# Patient Record
Sex: Female | Born: 1945 | Race: White | Hispanic: No | Marital: Married | Smoking: Never smoker
Health system: Southern US, Community
[De-identification: ages and names within clinical notes are randomized; demographics above are authoritative.]

## PROBLEM LIST (undated history)

## (undated) DIAGNOSIS — R7303 Prediabetes: Secondary | ICD-10-CM

## (undated) DIAGNOSIS — Z860100 Personal history of colon polyps, unspecified: Secondary | ICD-10-CM

## (undated) DIAGNOSIS — E785 Hyperlipidemia, unspecified: Secondary | ICD-10-CM

## (undated) DIAGNOSIS — M545 Low back pain, unspecified: Secondary | ICD-10-CM

## (undated) DIAGNOSIS — F419 Anxiety disorder, unspecified: Secondary | ICD-10-CM

## (undated) DIAGNOSIS — G4730 Sleep apnea, unspecified: Secondary | ICD-10-CM

## (undated) DIAGNOSIS — N904 Leukoplakia of vulva: Secondary | ICD-10-CM

## (undated) DIAGNOSIS — C801 Malignant (primary) neoplasm, unspecified: Secondary | ICD-10-CM

## (undated) DIAGNOSIS — K5790 Diverticulosis of intestine, part unspecified, without perforation or abscess without bleeding: Secondary | ICD-10-CM

## (undated) DIAGNOSIS — M7071 Other bursitis of hip, right hip: Secondary | ICD-10-CM

## (undated) DIAGNOSIS — E039 Hypothyroidism, unspecified: Secondary | ICD-10-CM

## (undated) DIAGNOSIS — Z972 Presence of dental prosthetic device (complete) (partial): Secondary | ICD-10-CM

## (undated) DIAGNOSIS — M1990 Unspecified osteoarthritis, unspecified site: Secondary | ICD-10-CM

## (undated) DIAGNOSIS — G454 Transient global amnesia: Secondary | ICD-10-CM

## (undated) DIAGNOSIS — K2270 Barrett's esophagus without dysplasia: Secondary | ICD-10-CM

## (undated) DIAGNOSIS — K219 Gastro-esophageal reflux disease without esophagitis: Secondary | ICD-10-CM

## (undated) DIAGNOSIS — K648 Other hemorrhoids: Secondary | ICD-10-CM

## (undated) DIAGNOSIS — N189 Chronic kidney disease, unspecified: Secondary | ICD-10-CM

## (undated) HISTORY — PX: BREAST ENHANCEMENT SURGERY: SHX7

## (undated) HISTORY — DX: Gastro-esophageal reflux disease without esophagitis: K21.9

## (undated) HISTORY — DX: Personal history of colonic polyps: Z86.010

## (undated) HISTORY — DX: Hyperlipidemia, unspecified: E78.5

## (undated) HISTORY — DX: Transient global amnesia: G45.4

## (undated) HISTORY — PX: TUBAL LIGATION: SHX77

## (undated) HISTORY — DX: Hypothyroidism, unspecified: E03.9

## (undated) HISTORY — DX: Diverticulosis of intestine, part unspecified, without perforation or abscess without bleeding: K57.90

## (undated) HISTORY — DX: Anxiety disorder, unspecified: F41.9

## (undated) HISTORY — DX: Sleep apnea, unspecified: G47.30

## (undated) HISTORY — DX: Other hemorrhoids: K64.8

## (undated) HISTORY — DX: Leukoplakia of vulva: N90.4

## (undated) HISTORY — DX: Personal history of colon polyps, unspecified: Z86.0100

## (undated) HISTORY — PX: OTHER SURGICAL HISTORY: SHX169

## (undated) HISTORY — PX: DILATION AND CURETTAGE OF UTERUS: SHX78

## (undated) HISTORY — DX: Barrett's esophagus without dysplasia: K22.70

## (undated) HISTORY — PX: HYSTEROSCOPY: SHX211

## (undated) HISTORY — PX: LASIK: SHX215

---

## 1998-12-09 ENCOUNTER — Other Ambulatory Visit: Admission: RE | Admit: 1998-12-09 | Discharge: 1998-12-09 | Payer: Self-pay | Admitting: Internal Medicine

## 2000-01-19 ENCOUNTER — Other Ambulatory Visit: Admission: RE | Admit: 2000-01-19 | Discharge: 2000-01-19 | Payer: Self-pay | Admitting: *Deleted

## 2001-04-23 DIAGNOSIS — K2270 Barrett's esophagus without dysplasia: Secondary | ICD-10-CM

## 2001-04-23 HISTORY — DX: Barrett's esophagus without dysplasia: K22.70

## 2001-05-22 ENCOUNTER — Other Ambulatory Visit: Admission: RE | Admit: 2001-05-22 | Discharge: 2001-05-22 | Payer: Self-pay | Admitting: *Deleted

## 2001-06-10 ENCOUNTER — Ambulatory Visit (HOSPITAL_COMMUNITY): Admission: RE | Admit: 2001-06-10 | Discharge: 2001-06-10 | Payer: Self-pay | Admitting: *Deleted

## 2002-01-05 ENCOUNTER — Encounter (INDEPENDENT_AMBULATORY_CARE_PROVIDER_SITE_OTHER): Payer: Self-pay

## 2002-01-05 ENCOUNTER — Ambulatory Visit (HOSPITAL_COMMUNITY): Admission: RE | Admit: 2002-01-05 | Discharge: 2002-01-05 | Payer: Self-pay | Admitting: *Deleted

## 2002-02-16 ENCOUNTER — Other Ambulatory Visit: Admission: RE | Admit: 2002-02-16 | Discharge: 2002-02-16 | Payer: Self-pay | Admitting: Gynecology

## 2003-04-09 ENCOUNTER — Encounter (INDEPENDENT_AMBULATORY_CARE_PROVIDER_SITE_OTHER): Payer: Self-pay | Admitting: Specialist

## 2003-04-09 ENCOUNTER — Ambulatory Visit (HOSPITAL_COMMUNITY): Admission: RE | Admit: 2003-04-09 | Discharge: 2003-04-09 | Payer: Self-pay | Admitting: *Deleted

## 2004-07-27 ENCOUNTER — Other Ambulatory Visit: Admission: RE | Admit: 2004-07-27 | Discharge: 2004-07-27 | Payer: Self-pay | Admitting: Gynecology

## 2004-10-04 ENCOUNTER — Ambulatory Visit (HOSPITAL_COMMUNITY): Admission: RE | Admit: 2004-10-04 | Discharge: 2004-10-04 | Payer: Self-pay | Admitting: Gynecology

## 2004-10-04 ENCOUNTER — Encounter (INDEPENDENT_AMBULATORY_CARE_PROVIDER_SITE_OTHER): Payer: Self-pay | Admitting: Specialist

## 2005-03-29 ENCOUNTER — Encounter (INDEPENDENT_AMBULATORY_CARE_PROVIDER_SITE_OTHER): Payer: Self-pay | Admitting: Specialist

## 2005-03-29 ENCOUNTER — Ambulatory Visit (HOSPITAL_COMMUNITY): Admission: RE | Admit: 2005-03-29 | Discharge: 2005-03-29 | Payer: Self-pay | Admitting: *Deleted

## 2006-05-16 LAB — CONVERTED CEMR LAB: Pap Smear: NORMAL

## 2007-06-02 ENCOUNTER — Ambulatory Visit: Payer: Self-pay | Admitting: Internal Medicine

## 2007-06-02 DIAGNOSIS — E785 Hyperlipidemia, unspecified: Secondary | ICD-10-CM | POA: Insufficient documentation

## 2007-06-02 LAB — CONVERTED CEMR LAB
Bilirubin Urine: NEGATIVE
Blood in Urine, dipstick: NEGATIVE
Glucose, Urine, Semiquant: NEGATIVE
Ketones, urine, test strip: NEGATIVE
Nitrite: NEGATIVE
Specific Gravity, Urine: 1.025
Urobilinogen, UA: 0.2
WBC Urine, dipstick: NEGATIVE
pH: 6.5

## 2007-06-03 LAB — CONVERTED CEMR LAB
ALT: 32 units/L (ref 0–35)
AST: 21 units/L (ref 0–37)
Albumin: 4.2 g/dL (ref 3.5–5.2)
Alkaline Phosphatase: 76 units/L (ref 39–117)
BUN: 14 mg/dL (ref 6–23)
Basophils Absolute: 0 10*3/uL (ref 0.0–0.1)
Basophils Relative: 0.3 % (ref 0.0–1.0)
Bilirubin, Direct: 0.2 mg/dL (ref 0.0–0.3)
CO2: 30 meq/L (ref 19–32)
Calcium: 9.8 mg/dL (ref 8.4–10.5)
Chloride: 105 meq/L (ref 96–112)
Cholesterol: 177 mg/dL (ref 0–200)
Creatinine, Ser: 1.1 mg/dL (ref 0.4–1.2)
Eosinophils Absolute: 0.2 10*3/uL (ref 0.0–0.6)
Eosinophils Relative: 3.2 % (ref 0.0–5.0)
GFR calc Af Amer: 65 mL/min
GFR calc non Af Amer: 54 mL/min
Glucose, Bld: 103 mg/dL — ABNORMAL HIGH (ref 70–99)
HCT: 40.1 % (ref 36.0–46.0)
HDL: 40.3 mg/dL (ref >39.0)
Hemoglobin: 13.6 g/dL (ref 12.0–15.0)
LDL Cholesterol: 103 mg/dL — ABNORMAL HIGH (ref 0–99)
Lymphocytes Relative: 31.9 % (ref 12.0–46.0)
MCHC: 33.8 g/dL (ref 30.0–36.0)
MCV: 90.1 fL (ref 78.0–100.0)
Monocytes Absolute: 0.5 10*3/uL (ref 0.2–0.7)
Monocytes Relative: 7.6 % (ref 3.0–11.0)
Neutro Abs: 4.1 10*3/uL (ref 1.4–7.7)
Neutrophils Relative %: 57 % (ref 43.0–77.0)
Platelets: 270 10*3/uL (ref 150–400)
Potassium: 4.1 meq/L (ref 3.5–5.1)
RBC: 4.45 M/uL (ref 3.87–5.11)
RDW: 12.2 % (ref 11.5–14.6)
Sodium: 143 meq/L (ref 135–145)
TSH: 0.91 microintl units/mL (ref 0.35–5.50)
Total Bilirubin: 0.6 mg/dL (ref 0.3–1.2)
Total CHOL/HDL Ratio: 4.4
Total Protein: 6.5 g/dL (ref 6.0–8.3)
Triglycerides: 170 mg/dL — ABNORMAL HIGH (ref 0–149)
VLDL: 34 mg/dL (ref 0–40)
WBC: 7.1 10*3/uL (ref 4.5–10.5)

## 2007-06-05 ENCOUNTER — Telehealth: Payer: Self-pay | Admitting: Internal Medicine

## 2007-07-15 ENCOUNTER — Encounter: Payer: Self-pay | Admitting: Internal Medicine

## 2007-08-25 ENCOUNTER — Encounter: Payer: Self-pay | Admitting: Internal Medicine

## 2007-08-27 ENCOUNTER — Telehealth: Payer: Self-pay | Admitting: Internal Medicine

## 2007-11-24 ENCOUNTER — Ambulatory Visit: Payer: Self-pay | Admitting: Internal Medicine

## 2007-11-24 LAB — CONVERTED CEMR LAB
ALT: 32 units/L (ref 0–35)
AST: 21 units/L (ref 0–37)
Albumin: 3.8 g/dL (ref 3.5–5.2)
Alkaline Phosphatase: 65 units/L (ref 39–117)
Bilirubin, Direct: 0.1 mg/dL (ref 0.0–0.3)
Cholesterol: 186 mg/dL (ref 0–200)
HDL: 41.8 mg/dL (ref >39.0)
LDL Cholesterol: 106 mg/dL — ABNORMAL HIGH (ref 0–99)
TSH: 3.53 microintl units/mL (ref 0.35–5.50)
Total Bilirubin: 0.7 mg/dL (ref 0.3–1.2)
Total CHOL/HDL Ratio: 4.4
Total Protein: 6.3 g/dL (ref 6.0–8.3)
Triglycerides: 190 mg/dL — ABNORMAL HIGH (ref 0–149)
VLDL: 38 mg/dL (ref 0–40)

## 2007-12-22 ENCOUNTER — Ambulatory Visit: Payer: Self-pay | Admitting: Internal Medicine

## 2008-01-01 ENCOUNTER — Encounter: Payer: Self-pay | Admitting: Internal Medicine

## 2008-02-13 ENCOUNTER — Ambulatory Visit: Payer: Self-pay | Admitting: Internal Medicine

## 2008-02-13 LAB — CONVERTED CEMR LAB
Free T4: 0.6 ng/dL (ref 0.6–1.6)
T3, Free: 2.8 pg/mL (ref 2.3–4.2)
TSH: 4.88 microintl units/mL (ref 0.35–5.50)

## 2008-02-20 ENCOUNTER — Telehealth: Payer: Self-pay | Admitting: Internal Medicine

## 2008-04-15 ENCOUNTER — Encounter: Payer: Self-pay | Admitting: Internal Medicine

## 2008-04-19 ENCOUNTER — Telehealth: Payer: Self-pay | Admitting: Internal Medicine

## 2008-05-27 ENCOUNTER — Ambulatory Visit: Payer: Self-pay | Admitting: Internal Medicine

## 2008-05-27 LAB — CONVERTED CEMR LAB
ALT: 33 units/L (ref 0–35)
AST: 21 units/L (ref 0–37)
Albumin: 4.1 g/dL (ref 3.5–5.2)
Alkaline Phosphatase: 62 units/L (ref 39–117)
BUN: 23 mg/dL (ref 6–23)
Basophils Absolute: 0 10*3/uL (ref 0.0–0.1)
Basophils Relative: 0.7 % (ref 0.0–3.0)
Bilirubin, Direct: 0.1 mg/dL (ref 0.0–0.3)
Blood in Urine, dipstick: NEGATIVE
CO2: 0 meq/L — CL (ref 19–32)
Calcium: 9.4 mg/dL (ref 8.4–10.5)
Chloride: 111 meq/L (ref 96–112)
Cholesterol: 167 mg/dL (ref 0–200)
Creatinine, Ser: 1.1 mg/dL (ref 0.4–1.2)
Eosinophils Absolute: 0.2 10*3/uL (ref 0.0–0.7)
Eosinophils Relative: 3.3 % (ref 0.0–5.0)
GFR calc Af Amer: 65 mL/min
GFR calc non Af Amer: 54 mL/min
Glucose, Bld: 101 mg/dL — ABNORMAL HIGH (ref 70–99)
Glucose, Urine, Semiquant: NEGATIVE
HCT: 40.1 % (ref 36.0–46.0)
HDL: 41 mg/dL (ref >39.0)
Hemoglobin: 13.5 g/dL (ref 12.0–15.0)
Ketones, urine, test strip: NEGATIVE
LDL Cholesterol: 103 mg/dL — ABNORMAL HIGH (ref 0–99)
Lymphocytes Relative: 40.9 % (ref 12.0–46.0)
MCHC: 33.7 g/dL (ref 30.0–36.0)
MCV: 93.2 fL (ref 78.0–100.0)
Monocytes Absolute: 0.5 10*3/uL (ref 0.1–1.0)
Monocytes Relative: 8 % (ref 3.0–12.0)
Neutro Abs: 2.7 10*3/uL (ref 1.4–7.7)
Neutrophils Relative %: 47.1 % (ref 43.0–77.0)
Nitrite: NEGATIVE
Platelets: 223 10*3/uL (ref 150–400)
Potassium: 4.2 meq/L (ref 3.5–5.1)
RBC: 4.31 M/uL (ref 3.87–5.11)
RDW: 12.7 % (ref 11.5–14.6)
Sodium: 143 meq/L (ref 135–145)
Specific Gravity, Urine: 1.02
TSH: 5.27 microintl units/mL (ref 0.35–5.50)
Total Bilirubin: 0.7 mg/dL (ref 0.3–1.2)
Total CHOL/HDL Ratio: 4.1
Total Protein: 6.8 g/dL (ref 6.0–8.3)
Triglycerides: 115 mg/dL (ref 0–149)
Urobilinogen, UA: 0.2
VLDL: 23 mg/dL (ref 0–40)
WBC Urine, dipstick: NEGATIVE
WBC: 5.8 10*3/uL (ref 4.5–10.5)
pH: 5.5

## 2008-06-04 ENCOUNTER — Ambulatory Visit: Payer: Self-pay | Admitting: Internal Medicine

## 2008-07-15 ENCOUNTER — Telehealth: Payer: Self-pay | Admitting: Internal Medicine

## 2008-08-26 ENCOUNTER — Encounter: Payer: Self-pay | Admitting: Internal Medicine

## 2008-09-27 ENCOUNTER — Telehealth: Payer: Self-pay | Admitting: Internal Medicine

## 2008-11-02 ENCOUNTER — Telehealth: Payer: Self-pay | Admitting: Internal Medicine

## 2009-03-29 ENCOUNTER — Encounter (INDEPENDENT_AMBULATORY_CARE_PROVIDER_SITE_OTHER): Payer: Self-pay | Admitting: *Deleted

## 2009-04-23 LAB — CONVERTED CEMR LAB: Pap Smear: NORMAL

## 2009-07-14 ENCOUNTER — Telehealth: Payer: Self-pay | Admitting: Internal Medicine

## 2009-07-15 ENCOUNTER — Telehealth: Payer: Self-pay | Admitting: *Deleted

## 2009-07-20 ENCOUNTER — Ambulatory Visit: Payer: Self-pay | Admitting: Internal Medicine

## 2009-07-20 LAB — CONVERTED CEMR LAB
ALT: 27 units/L (ref 0–35)
AST: 18 units/L (ref 0–37)
Albumin: 3.9 g/dL (ref 3.5–5.2)
Alkaline Phosphatase: 68 units/L (ref 39–117)
BUN: 22 mg/dL (ref 6–23)
Basophils Absolute: 0.1 10*3/uL (ref 0.0–0.1)
Basophils Relative: 0.9 % (ref 0.0–3.0)
Bilirubin Urine: NEGATIVE
Bilirubin, Direct: 0.1 mg/dL (ref 0.0–0.3)
CO2: 30 meq/L (ref 19–32)
Calcium: 9.4 mg/dL (ref 8.4–10.5)
Chloride: 109 meq/L (ref 96–112)
Cholesterol: 151 mg/dL (ref 0–200)
Creatinine, Ser: 1 mg/dL (ref 0.4–1.2)
Eosinophils Absolute: 0.2 10*3/uL (ref 0.0–0.7)
Eosinophils Relative: 4 % (ref 0.0–5.0)
GFR calc non Af Amer: 59.77 mL/min (ref >60)
Glucose, Bld: 101 mg/dL — ABNORMAL HIGH (ref 70–99)
HCT: 38.9 % (ref 36.0–46.0)
HDL: 49.2 mg/dL (ref >39.00)
Hemoglobin, Urine: NEGATIVE
Hemoglobin: 12.9 g/dL (ref 12.0–15.0)
Ketones, ur: NEGATIVE mg/dL
LDL Cholesterol: 79 mg/dL (ref 0–99)
Leukocytes, UA: NEGATIVE
Lymphocytes Relative: 41.6 % (ref 12.0–46.0)
Lymphs Abs: 2.4 10*3/uL (ref 0.7–4.0)
MCHC: 33.1 g/dL (ref 30.0–36.0)
MCV: 94.6 fL (ref 78.0–100.0)
Monocytes Absolute: 0.5 10*3/uL (ref 0.1–1.0)
Monocytes Relative: 8.6 % (ref 3.0–12.0)
Neutro Abs: 2.6 10*3/uL (ref 1.4–7.7)
Neutrophils Relative %: 44.9 % (ref 43.0–77.0)
Nitrite: NEGATIVE
Platelets: 266 10*3/uL (ref 150.0–400.0)
Potassium: 4.2 meq/L (ref 3.5–5.1)
RBC: 4.11 M/uL (ref 3.87–5.11)
RDW: 12.2 % (ref 11.5–14.6)
Sodium: 145 meq/L (ref 135–145)
Specific Gravity, Urine: >=1.03 (ref 1.000–1.030)
TSH: 5.9 microintl units/mL — ABNORMAL HIGH (ref 0.35–5.50)
Total Bilirubin: 0.5 mg/dL (ref 0.3–1.2)
Total CHOL/HDL Ratio: 3
Total Protein, Urine: NEGATIVE mg/dL
Total Protein: 6.7 g/dL (ref 6.0–8.3)
Triglycerides: 112 mg/dL (ref 0.0–149.0)
Urine Glucose: NEGATIVE mg/dL
Urobilinogen, UA: 0.2 (ref 0.0–1.0)
VLDL: 22.4 mg/dL (ref 0.0–40.0)
WBC: 5.8 10*3/uL (ref 4.5–10.5)
pH: 6 (ref 5.0–8.0)

## 2009-08-02 ENCOUNTER — Telehealth: Payer: Self-pay | Admitting: Internal Medicine

## 2009-08-02 ENCOUNTER — Ambulatory Visit: Payer: Self-pay | Admitting: Internal Medicine

## 2009-08-02 DIAGNOSIS — E039 Hypothyroidism, unspecified: Secondary | ICD-10-CM

## 2009-08-05 ENCOUNTER — Encounter (INDEPENDENT_AMBULATORY_CARE_PROVIDER_SITE_OTHER): Payer: Self-pay | Admitting: *Deleted

## 2009-08-05 ENCOUNTER — Encounter: Payer: Self-pay | Admitting: Internal Medicine

## 2009-08-08 ENCOUNTER — Telehealth: Payer: Self-pay | Admitting: Internal Medicine

## 2009-08-10 ENCOUNTER — Telehealth: Payer: Self-pay | Admitting: Internal Medicine

## 2009-08-17 ENCOUNTER — Encounter (INDEPENDENT_AMBULATORY_CARE_PROVIDER_SITE_OTHER): Payer: Self-pay | Admitting: *Deleted

## 2009-08-18 ENCOUNTER — Encounter: Payer: Self-pay | Admitting: Internal Medicine

## 2009-08-19 ENCOUNTER — Ambulatory Visit: Payer: Self-pay | Admitting: Internal Medicine

## 2009-08-29 ENCOUNTER — Encounter: Payer: Self-pay | Admitting: Internal Medicine

## 2009-09-02 ENCOUNTER — Ambulatory Visit: Payer: Self-pay | Admitting: Internal Medicine

## 2009-09-27 ENCOUNTER — Telehealth: Payer: Self-pay | Admitting: Internal Medicine

## 2009-11-17 ENCOUNTER — Telehealth: Payer: Self-pay | Admitting: Internal Medicine

## 2010-01-31 ENCOUNTER — Ambulatory Visit: Payer: Self-pay | Admitting: Internal Medicine

## 2010-02-01 LAB — CONVERTED CEMR LAB
ALT: 23 units/L (ref 0–35)
AST: 18 units/L (ref 0–37)
Basophils Absolute: 0 10*3/uL (ref 0.0–0.1)
Bilirubin, Direct: 0.1 mg/dL (ref 0.0–0.3)
Cholesterol: 173 mg/dL (ref 0–200)
Eosinophils Absolute: 0.2 10*3/uL (ref 0.0–0.7)
HDL: 45.5 mg/dL (ref >39.00)
MCHC: 34.3 g/dL (ref 30.0–36.0)
MCV: 93.3 fL (ref 78.0–100.0)
Monocytes Absolute: 0.5 10*3/uL (ref 0.1–1.0)
Neutrophils Relative %: 53.9 % (ref 43.0–77.0)
Platelets: 255 10*3/uL (ref 150.0–400.0)
TSH: 3.55 microintl units/mL (ref 0.35–5.50)
Total Bilirubin: 0.6 mg/dL (ref 0.3–1.2)
Total Protein: 6.5 g/dL (ref 6.0–8.3)

## 2010-03-20 ENCOUNTER — Ambulatory Visit (HOSPITAL_COMMUNITY): Admission: RE | Admit: 2010-03-20 | Discharge: 2010-03-20 | Payer: Self-pay | Admitting: Obstetrics and Gynecology

## 2010-03-24 ENCOUNTER — Telehealth: Payer: Self-pay | Admitting: Internal Medicine

## 2010-05-22 ENCOUNTER — Telehealth: Payer: Self-pay | Admitting: Internal Medicine

## 2010-05-23 NOTE — Medication Information (Signed)
Summary: Coverage Approval for Nexium  Coverage Approval for Nexium   Imported By: Maryln Gottron 08/23/2009 14:21:32  _____________________________________________________________________  External Attachment:    Type:   Image     Comment:   External Document

## 2010-05-23 NOTE — Progress Notes (Signed)
Summary: med change request  Phone Note Other Incoming Call back at [phone]   Caller: [employer] via fax Summary of Call: Nexium requires prior authorization.  Can get prilosec or generic omeprazole or generic pantoprazole without prior authorization..   Please advise. Initial call taken by: Gladis Riffle, RN,  August 08, 2009 3:30 PM  Follow-up for Phone Call        change to omeprazole 20 mg by mouth once daily #90/3 Follow-up by: Birdie Sons MD,  August 08, 2009 3:40 PM  Additional Follow-up for Phone Call Additional follow up Details #1::        med changed on list of last ov.  see Rx.  Patient notified.  Additional Follow-up by: Gladis Riffle, RN,  August 08, 2009 4:23 PM

## 2010-05-23 NOTE — Medication Information (Signed)
Summary: Coverage Approval for Ambien  Coverage Approval for Ambien   Imported By: Maryln Gottron 08/08/2009 15:29:31  _____________________________________________________________________  External Attachment:    Type:   Image     Comment:   External Document

## 2010-05-23 NOTE — Progress Notes (Signed)
Summary: call  Phone Note Call from Patient   Caller: Patient Call For: Birdie Sons MD Summary of Call: [phone] Returned call and LMTCB. Called again about changing generic med back to Nexium.  [facility].  [phone].  Rudy Jew, RN  September 27, 2009 12:16 PM  Initial call taken by: Lynann Beaver CMA,  September 27, 2009 10:17 AM    New/Updated Medications: NEXIUM 20 MG CPDR (ESOMEPRAZOLE MAGNESIUM) one by mouth daily Prescriptions: NEXIUM 20 MG CPDR (ESOMEPRAZOLE MAGNESIUM) one by mouth daily  #90 x 3   Entered by:   Lynann Beaver CMA   Authorized by:   Birdie Sons MD   Signed by:   Lynann Beaver CMA on 09/27/2009   Method used:   Electronically to        [facility]* (mail-order)             ,          Ph: [phone]       Fax: [phone]   RxID:   [id number]  Pt notified.

## 2010-05-23 NOTE — Progress Notes (Signed)
Summary: [facility]  Phone Note Call from Patient Call back at Work Phone [phone]   Caller: Patient-----voicemail Summary of Call: Was seen this morning. She uses [facility] for her Lipitor and Nexium. Initial call taken by: Warnell Forester,  August 02, 2009 11:04 AM  Follow-up for Phone Call        see Rx on ov 08/02/09 Follow-up by: Gladis Riffle, RN,  August 02, 2009 3:18 PM

## 2010-05-23 NOTE — Miscellaneous (Signed)
Summary: [facility]  Clinical Lists Changes  Medications: Added new medication of MIRALAX   POWD (POLYETHYLENE GLYCOL 3350) As per prep  instructions. - Signed Added new medication of REGLAN 10 MG  TABS (METOCLOPRAMIDE HCL) As per prep instructions. - Signed Added new medication of DULCOLAX 5 MG  TBEC (BISACODYL) Day before procedure take 2 at 3pm and 2 at 8pm. - Signed Rx of MIRALAX   POWD (POLYETHYLENE GLYCOL 3350) As per prep  instructions.;  #255gm x 0;  Signed;  Entered by: Ezra Sites RN;  Authorized by: Hart Carwin MD;  Method used: Electronically to [facility]*, [address], [place], [place]  [address], Ph: [phone], Fax: [phone] Rx of REGLAN 10 MG  TABS (METOCLOPRAMIDE HCL) As per prep instructions.;  #2 x 0;  Signed;  Entered by: Ezra Sites RN;  Authorized by: Hart Carwin MD;  Method used: Electronically to [facility]*, [address], [place], [place]  [address], Ph: [phone], Fax: [phone] Rx of DULCOLAX 5 MG  TBEC (BISACODYL) Day before procedure take 2 at 3pm and 2 at 8pm.;  #4 x 0;  Signed;  Entered by: Ezra Sites RN;  Authorized by: Hart Carwin MD;  Method used: Electronically to [facility]*, [address], [place], [place]  [address], Ph: [phone], Fax: [phone] Observations: Added new observation of NKA: T (08/19/2009 8:20)    Prescriptions: DULCOLAX 5 MG  TBEC (BISACODYL) Day before procedure take 2 at 3pm and 2 at 8pm.  #4 x 0   Entered by:   Ezra Sites RN   Authorized by:   Hart Carwin MD   Signed by:   Ezra Sites RN on 08/19/2009   Method used:   Electronically to        [facility]* (retail)       [address]       [place], [place]  [address]       Ph: [phone]       Fax: [phone]   RxID:   [id number] REGLAN 10 MG  TABS (METOCLOPRAMIDE HCL) As per prep instructions.  #2 x 0   Entered by:   Ezra Sites RN   Authorized by:   Hart Carwin MD   Signed by:    Ezra Sites RN on 08/19/2009   Method used:   Electronically to        [facility]* (retail)       [address]       [place], [place]  [address]       Ph: [phone]       Fax: [phone]   RxID:   [id number] MIRALAX   POWD (POLYETHYLENE GLYCOL 3350) As per prep  instructions.  #255gm x 0   Entered by:   Ezra Sites RN   Authorized by:   Hart Carwin MD   Signed by:   Ezra Sites RN on 08/19/2009   Method used:   Electronically to        [facility]* (retail)       [address]       [place], [place]  [address]       Ph: [phone]       Fax: [phone]   RxID:   [id number]

## 2010-05-23 NOTE — Letter (Signed)
Summary: Previsit letter  [facility]  [address] [place], [place] [address]   Phone: [phone]  Fax: [phone]       08/05/2009 MRN: [id number]  [facility] [address] [place], [place]  [address]  Dear Megan Booth,  Welcome to the Gastroenterology Division at [facility].    You are scheduled to see a nurse for your pre-procedure visit on 08-19-09 at 3:30p.m. on the 3rd floor at [facility], [address]  We ask that you try to arrive at our office 15 minutes prior to your appointment time to allow for check-in.  Your nurse visit will consist of discussing your medical and surgical history, your immediate family medical history, and your medications.    Please bring a complete list of all your medications or, if you prefer, bring the medication bottles and we will list them.  We will need to be aware of both prescribed and over the counter drugs.  We will need to know exact dosage information as well.  If you are on blood thinners (Coumadin, Plavix, Aggrenox, Ticlid, etc.) please call our office today/prior to your appointment, as we need to consult with your physician about holding your medication.   Please be prepared to read and sign documents such as consent forms, a financial agreement, and acknowledgement forms.  If necessary, and with your consent, a friend or relative is welcome to sit-in on the nurse visit with you.  Please bring your insurance card so that we may make a copy of it.  If your insurance requires a referral to see a specialist, please bring your referral form from your primary care physician.  No co-pay is required for this nurse visit.     If you cannot keep your appointment, please call [phone] to cancel or reschedule prior to your appointment date.  This allows [place] the opportunity to schedule an appointment for another patient in need of care.    Thank you for choosing [facility] for your medical  needs.  We appreciate the opportunity to care for you.  Please visit [place] at our website  to learn more about our practice.                     Sincerely.                                                                                                                   The Gastroenterology Division

## 2010-05-23 NOTE — Progress Notes (Signed)
Summary: Pt req partial refill of Liptor to [facility] Note Call from Patient Call back at [employer] [phone] Call back at Work Phone [phone]   Caller: Patient Summary of Call: Pt almost out of Lipitor. Needs partial refill #14 or less, to last until her ov on 07/29/09. Please call in to local [facility].  Initial call taken by: Lucy Antigua,  July 15, 2009 10:24 AM    Prescriptions: LIPITOR 40 MG  TABS (ATORVASTATIN CALCIUM) once daily  #15 x 0   Entered by:   Kern Reap CMA (AAMA)   Authorized by:   Birdie Sons MD   Signed by:   Kern Reap CMA (AAMA) on 07/15/2009   Method used:   Electronically to        [facility]* (retail)       [address]       [place], [place]  [address]       Ph: [phone]       Fax: [phone]   RxID:   [id number]

## 2010-05-23 NOTE — Procedures (Signed)
Summary: Colonoscopy  Patient: Megan Booth Note: All result statuses are Final unless otherwise noted.  Tests: (1) Colonoscopy (COL)   COL Colonoscopy           DONE     [facility]     [address]     [place], [place]  [address]           COLONOSCOPY PROCEDURE REPORT           PATIENT:  [name], [name]  MR#:  [id number]     BIRTHDATE:  [birth year], 63 yrs. old  GENDER:  female     ENDOSCOPIST:  Hedwig Morton. Juanda Chance, MD     REF. BY:  Birdie Sons, M.D.     PROCEDURE DATE:  09/02/2009     PROCEDURE:  Colonoscopy 19[address]     ASA CLASS:  Class I     INDICATIONS:  Routine Risk Screening     MEDICATIONS:   Versed 10 mg, Fentanyl 75 mcg           DESCRIPTION OF PROCEDURE:   After the risks benefits and     alternatives of the procedure were thoroughly explained, informed     consent was obtained.  Digital rectal exam was performed and     revealed no rectal masses.   The LB [id number] endoscope     was introduced through the anus and advanced to the cecum, which     was identified by both the appendix and ileocecal valve, without     limitations.  The quality of the prep was good, using MiraLax.     The instrument was then slowly withdrawn as the colon was fully     examined.     <<PROCEDUREIMAGES>>           FINDINGS:  Moderate diverticulosis was found in the sigmoid colon     (see image4, image3, image5, and image6).  This was otherwise a     normal examination of the colon (see image7, image2, and image1).     Retroflexed views in the rectum revealed no abnormalities.    The     scope was then withdrawn from the patient and the procedure     completed.           COMPLICATIONS:  None     ENDOSCOPIC IMPRESSION:     1) Moderate diverticulosis in the sigmoid colon     2) Otherwise normal examination     RECOMMENDATIONS:     1) high fiber diet     REPEAT EXAM:  In 10 year(s) for.           ______________________________     Hedwig Morton. Juanda Chance, MD           CC:        n.     eSIGNED:   Hedwig Morton. Richel Millspaugh at 09/02/2009 10:59 AM           Abby Potash, [phone]  Note: An exclamation mark (!) indicates a result that was not dispersed into the flowsheet. Document Creation Date: 09/02/2009 10:59 AM _______________________________________________________________________  (1) Order result status: Final Collection or observation date-time: 09/02/2009 10:53 Requested date-time:  Receipt date-time:  Reported date-time:  Referring Physician:   Ordering Physician: [name] [phone]) Specimen Source:  Source: Launa Grill Order Number: [id number] Lab site:   Appended Document: Colonoscopy    Clinical Lists Changes  Observations: Added new observation of COLONNXTDUE: 08/2019 (09/02/2009 11:31)

## 2010-05-23 NOTE — Progress Notes (Signed)
Summary: Ambien and Nexium refill request  Phone Note Call from Patient   Caller: Patient Call For: Birdie Sons MD Summary of Call: VM, Pt requesting refill Nexium and Ambien.  BC/BS may require a new PA [facility] Initial call taken by: Sid Falcon LPN,  March 24, 2010 12:45 PM  Follow-up for Phone Call        Remus Loffler already called in, sent in nexium electronically Follow-up by: Alfred Levins, CMA,  March 24, 2010 3:13 PM    Prescriptions: NEXIUM 20 MG CPDR (ESOMEPRAZOLE MAGNESIUM) one by mouth daily  #30 x 5   Entered by:   Alfred Levins, CMA   Authorized by:   Birdie Sons MD   Signed by:   Alfred Levins, CMA on 03/24/2010   Method used:   Electronically to        [facility]* (retail)       [address]       [place], [place]  [address]       Ph: [phone]       Fax: [phone]   RxID:   [id number]

## 2010-05-23 NOTE — Progress Notes (Signed)
Summary: omeprazole to nexium  Phone Note Call from Patient   Caller: Patient Call For: Birdie Sons MD Summary of Call: Pt does not want the Omeprazole.  Wants a new prescription for Nexium to attempt to get Prior Authorization.   She will call the insurance company and request appropriate paper work. [phone] Initial call taken by: Lynann Beaver CMA,  August 10, 2009 12:12 PM  Follow-up for Phone Call        awaiting prior authorization papers. Follow-up by: Gladis Riffle, RN,  August 10, 2009 2:57 PM

## 2010-05-23 NOTE — Assessment & Plan Note (Signed)
Summary: cpx/cjr/pt rescd from bump//ccm   Vital Signs:  Patient profile:   65 year old female Height:      63 inches Weight:      164 pounds BMI:     29.16 Pulse rate:   72 / minute Pulse rhythm:   regular Resp:     12 per minute BP sitting:   102 / 64  (left arm) Cuff size:   regular  Vitals Entered By: Gladis Riffle, RN (August 02, 2009 8:54 AM) CC: cpx, labs done--has gyn Is Patient Diabetic? No   CC:  cpx and labs done--has gyn.  History of Present Illness: CPX  Preventive Screening-Counseling & Management  Alcohol-Tobacco     Smoking Status: never  Current Medications (verified): 1)  Nexium 40 Mg  Cpdr (Esomeprazole Magnesium) .Marland Kitchen.. 1 Once Daily 2)  Lipitor 40 Mg  Tabs (Atorvastatin Calcium) .... Once Daily 3)  Ambien 10 Mg  Tabs (Zolpidem Tartrate) .Marland Kitchen.. 1 At Bedtime As Needed 4)  Furosemide 20 Mg  Tabs (Furosemide) .... As Needed 5)  D2000 Ultra Strength 2000 Unit Caps (Cholecalciferol) .... One Time A Day--Hold 6)  Bi Est 2.50progesterone 50  Mg .... One Time A Day 7)  Vitamin D (Ergocalciferol) 50000 Unit Caps (Ergocalciferol) .... Once Weekly X 12 Weeks  Allergies (verified): No Known Drug Allergies  Past History:  Past Medical History: Last updated: 06/02/2007 GERD Hyperlipidemia Hypothyroidism  Past Surgical History: Last updated: 06/02/2007 lasik surger/subsequent re-do Breast Augmentation D&C for irregular period  Family History: Last updated: 06/02/2007 mother-heart trouble htn, dm, dementia, depression Family History Depression-mother Family History Diabetes 1st degree relative-mother Family History Lung cancer-father 33 brother seizure d/o Family History Seizures  Social History: Last updated: 06/02/2007 Married Occupation:-bankruptcy Never Smoked Alcohol use-yes Regular exercise-no  Risk Factors: Exercise: no (06/02/2007)  Risk Factors: Smoking Status: never (08/02/2009)   Impression & Recommendations:  Problem # 1:   PREVENTIVE HEALTH CARE (ICD-V70.0) health maint UTD Orders: Gastroenterology Referral (GI)  Complete Medication List: 1)  Omeprazole 20 Mg Tbec (Omeprazole) .... Take 1 tablet by mouth once a day 2)  Lipitor 40 Mg Tabs (Atorvastatin calcium) .... Once daily 3)  Ambien 10 Mg Tabs (Zolpidem tartrate) .Marland Kitchen.. 1 at bedtime as needed 4)  Furosemide 20 Mg Tabs (Furosemide) .... As needed 5)  D2000 Ultra Strength 2000 Unit Caps (Cholecalciferol) .... One time a day--hold 6)  Bi Est 2.50progesterone 50 Mg  .... One time a day 7)  Vitamin D (ergocalciferol) 50000 Unit Caps (Ergocalciferol) .... Once weekly x 12 weeks  Preventive Care Screening  Mammogram:    Date:  08/26/2008    Next Due:  08/2010    Results:  normal   Pap Smear:    Date:  04/23/2009    Next Due:  04/2012    Results:  normal-pt's report    Patient Instructions: 1)  Please schedule a follow-up appointment in 6 months. TSH Prescriptions: OMEPRAZOLE 20 MG TBEC (OMEPRAZOLE) Take 1 tablet by mouth once a day  #90 x 3   Entered by:   Gladis Riffle, RN   Authorized by:   Birdie Sons MD   Signed by:   Gladis Riffle, RN on 08/08/2009   Method used:   Electronically to        [facility]* (mail-order)             ,          Ph: [phone]       Fax: [phone]  RxID:   [id number] NEXIUM 40 MG  CPDR (ESOMEPRAZOLE MAGNESIUM) 1 once daily  #90 x 3   Entered by:   Gladis Riffle, RN   Authorized by:   Birdie Sons MD   Signed by:   Gladis Riffle, RN on 08/02/2009   Method used:   Electronically to        [facility]* (mail-order)             ,          Ph: [phone]       Fax: [phone]   RxID:   [id number] LIPITOR 40 MG  TABS (ATORVASTATIN CALCIUM) once daily  #90 x 3   Entered by:   Gladis Riffle, RN   Authorized by:   Birdie Sons MD   Signed by:   Gladis Riffle, RN on 08/02/2009   Method used:   Electronically to        [facility]* (mail-order)             ,          Ph: [phone]       Fax:  [phone]   RxID:   [id number] AMBIEN 10 MG  TABS (ZOLPIDEM TARTRATE) 1 at bedtime as needed  #20 x 4   Entered and Authorized by:   Birdie Sons MD   Signed by:   Birdie Sons MD on 08/02/2009   Method used:   Print then Give to Patient   RxID:   [id number] FUROSEMIDE 20 MG  TABS (FUROSEMIDE) as needed  #90 x 3   Entered and Authorized by:   Birdie Sons MD   Signed by:   Birdie Sons MD on 08/02/2009   Method used:   Electronically to        [facility]* (retail)       [address]       [place], [place]  [address]       Ph: [phone]       Fax: [phone]   RxID:   [id number] LIPITOR 40 MG  TABS (ATORVASTATIN CALCIUM) once daily  #90 x 3   Entered and Authorized by:   Birdie Sons MD   Signed by:   Birdie Sons MD on 08/02/2009   Method used:   Electronically to        [facility]* (retail)       [address]       [place], [place]  [address]       Ph: [phone]       Fax: [phone]   RxID:   [id number] NEXIUM 40 MG  CPDR (ESOMEPRAZOLE MAGNESIUM) 1 once daily  #90 x 3   Entered and Authorized by:   Birdie Sons MD   Signed by:   Birdie Sons MD on 08/02/2009   Method used:   Electronically to        [facility]* (retail)       [address]       [place], [place]  [address]       Ph: [phone]       Fax: [phone]   RxID:   [id number]  Pt called to have lipitor and nexium sent to [facility].  Prevention & Chronic Care Immunizations   Influenza vaccine: Not documented    Tetanus booster: 05/28/2006: given   Tetanus booster due: 05/28/2016    Pneumococcal vaccine: Not documented    H. zoster vaccine: Not documented  Colorectal  Screening   Hemoccult: Not documented    Colonoscopy: normal  (12/22/2001)   Colonoscopy due: 12/2011  Other Screening   Pap smear: normal-pt's report  (04/23/2009)   Pap smear due: 04/2012    Mammogram: normal  (08/26/2008)   Mammogram due:  08/2010    DXA bone density scan: unknown results  (08/20/2006)   DXA scan due: None    Smoking status: never  (08/02/2009)  Lipids   Total Cholesterol: 151  (07/20/2009)   LDL: 79  (07/20/2009)   LDL Direct: Not documented   HDL: 49.20  (07/20/2009)   Triglycerides: 112.0  (07/20/2009)    SGOT (AST): 18  (07/20/2009)   SGPT (ALT): 27  (07/20/2009)   Alkaline phosphatase: 68  (07/20/2009)   Total bilirubin: 0.5  (07/20/2009)  Self-Management Support :    Lipid self-management support: Not documented     Preventive Care Screening  Mammogram:    Date:  08/26/2008    Next Due:  08/2010    Results:  normal   Pap Smear:    Date:  04/23/2009    Next Due:  04/2012    Results:  normal-pt's report   Physical Exam General Appearance: well developed, well nourished, no acute distress Eyes: conjunctiva and lids normal, PERRL, EOMI,  Ears, Nose, Mouth, Throat: TM clear, nares clear, oral exam WNL Neck: supple, no lymphadenopathy, no thyromegaly, no JVD Respiratory: clear to auscultation and percussion, respiratory effort normal Cardiovascular: regular rate and rhythm, S1-S2, no murmur, rub or gallop, no bruits, peripheral pulses normal and symmetric, no cyanosis, clubbing, edema or varicosities Chest: no scars, masses, tenderness; no asymmetry, skin changes, nipple discharge   Gastrointestinal: soft, non-tender; no hepatosplenomegaly, masses; active bowel sounds all quadrants,  Lymphatic: no cervical, axillary or inguinal adenopathy Musculoskeletal: gait normal, muscle tone and strength WNL, no joint swelling, effusions, discoloration, crepitus  Skin: clear, good turgor, color WNL, no rashes, lesions, or ulcerations Neurologic: normal mental status, normal reflexes, normal strength, sensation, and motion Psychiatric: alert; oriented to person, place and time Other Exam:

## 2010-05-23 NOTE — Assessment & Plan Note (Signed)
Summary: 6 month fup---tsh lab//ccm   Vital Signs:  Patient profile:   65 year old female Weight:      166 pounds BMI:     29.51 Temp:     98.7 degrees F oral Pulse rate:   98 / minute Resp:     12 per minute BP sitting:   144 / 80  Vitals Entered By: Lynann Beaver CMA (January 31, 2010 9:43 AM) CC: rov Is Patient Diabetic? No Pain Assessment Patient in pain? no        CC:  rov.  History of Present Illness:  Follow-Up Visit      This is a 65 year old woman who presents for Follow-up visit.  The patient denies chest pain and palpitations.  Since the last visit the patient notes no new problems or concerns.  The patient reports taking meds as prescribed.  When questioned about possible medication side effects, the patient notes none.  has had some GYN complaints---seeing GYN this week for AUB.  All other systems reviewed and were negative   Current Problems (verified): 1)  Hypothyroidism  (ICD-244.9) 2)  Hyperlipidemia  (ICD-272.4) 3)  Gerd  (ICD-530.81) 4)  Preventive Health Care  (ICD-V70.0)  Current Medications (verified): 1)  Lipitor 40 Mg  Tabs (Atorvastatin Calcium) .... Once Daily 2)  Ambien 10 Mg  Tabs (Zolpidem Tartrate) .Marland Kitchen.. 1 At Bedtime As Needed 3)  Furosemide 20 Mg  Tabs (Furosemide) .... As Needed 4)  D2000 Ultra Strength 2000 Unit Caps (Cholecalciferol) .... One Time A Day--Hold 5)  Bi Est 2.50progesterone 50  Mg .... One Time A Day 6)  Nexium 20 Mg Cpdr (Esomeprazole Magnesium) .... One By Mouth Daily  Allergies (verified): No Known Drug Allergies  Past History:  Past Medical History: Last updated: 06/02/2007 GERD Hyperlipidemia Hypothyroidism  Past Surgical History: Last updated: 06/02/2007 lasik surger/subsequent re-do Breast Augmentation D&C for irregular period  Family History: Last updated: 06/02/2007 mother-heart trouble htn, dm, dementia, depression Family History Depression-mother Family History Diabetes 1st degree  relative-mother Family History Lung cancer-father 39 brother seizure d/o Family History Seizures  Social History: Last updated: 06/02/2007 Married Occupation:-bankruptcy Never Smoked Alcohol use-yes Regular exercise-no  Risk Factors: Exercise: no (06/02/2007)  Risk Factors: Smoking Status: never (08/02/2009)  Physical Exam  General:  alert and well-developed.   Head:  normocephalic and atraumatic.   Eyes:  pupils equal and pupils round.   Neck:  No deformities, masses, or tenderness noted. Chest Wall:  No deformities, masses, or tenderness noted. Lungs:  Normal respiratory effort, chest expands symmetrically. Lungs are clear to auscultation, no crackles or wheezes. Heart:  Normal rate and regular rhythm. S1 and S2 normal without gallop, murmur, click, rub or other extra sounds. Abdomen:  Bowel sounds positive,abdomen soft and non-tender without masses, organomegaly or hernias noted. Skin:  turgor normal and color normal.   Psych:  normally interactive and good eye contact.     Impression & Recommendations:  Problem # 1:  HYPOTHYROIDISM (ICD-244.9) check today Labs Reviewed: TSH: 5.90 (07/20/2009)    Chol: 151 (07/20/2009)   HDL: 49.20 (07/20/2009)   LDL: 79 (07/20/2009)   TG: 112.0 (07/20/2009)  Orders: Venipuncture ([address]) TLB-TSH (Thyroid Stimulating Hormone) (84443-TSH)  Problem # 2:  HYPERLIPIDEMIA (ICD-272.4) controlled check labs Her updated medication list for this problem includes:    Lipitor 40 Mg Tabs (Atorvastatin calcium) ..... Once daily  Labs Reviewed: SGOT: 18 (07/20/2009)   SGPT: 27 (07/20/2009)   HDL:49.20 (07/20/2009), 41.0 (05/27/2008)  LDL:79 (07/20/2009),  103 (05/27/2008)  Chol:151 (07/20/2009), 167 (05/27/2008)  Trig:112.0 (07/20/2009), 115 (05/27/2008)  Orders: TLB-Lipid Panel (80061-LIPID) TLB-Hepatic/Liver Function Pnl (80076-HEPATIC)  Problem # 3:  GERD (ICD-530.81) controlled continue current medications  Her updated medication  list for this problem includes:    Nexium 20 Mg Cpdr (Esomeprazole magnesium) ..... One by mouth daily  Labs Reviewed: Hgb: 12.9 (07/20/2009)   Hct: 38.9 (07/20/2009)  Orders: TLB-CBC Platelet - w/Differential (85025-CBCD)  Complete Medication List: 1)  Lipitor 40 Mg Tabs (Atorvastatin calcium) .... Once daily 2)  Ambien 10 Mg Tabs (Zolpidem tartrate) .Marland Kitchen.. 1 at bedtime as needed 3)  Furosemide 20 Mg Tabs (Furosemide) .... As needed 4)  D2000 Ultra Strength 2000 Unit Caps (Cholecalciferol) .... One time a day--hold 5)  Bi Est 2.50progesterone 50 Mg  .... One time a day 6)  Nexium 20 Mg Cpdr (Esomeprazole magnesium) .... One by mouth daily

## 2010-05-23 NOTE — Letter (Signed)
Summary: [facility] Instructions  [facility]  [address] [place], [place] [address]   Phone: [phone]  Fax: [phone]       Megan Booth    63-23-1947    MRN: [id number]       Procedure Day /Date:  Friday 09/02/2009     Arrival Time:  9:30 am     Procedure Time: 10:30 am     Location of Procedure:                    _x _  [facility] (4th Floor)    PREPARATION FOR COLONOSCOPY WITH MIRALAX  Starting 5 days prior to your procedure Sunday 5/8  do not eat nuts, seeds, popcorn, corn, beans, peas,  salads, or any raw vegetables.  Do not take any fiber supplements (e.g. Metamucil, Citrucel, and Benefiber). ____________________________________________________________________________________________________   THE DAY BEFORE YOUR PROCEDURE         DATE: Thursday 5/12  1   Drink clear liquids the entire day-NO SOLID FOOD  2   Do not drink anything colored red or purple.  Avoid juices with pulp.  No orange juice.  3   Drink at least 64 oz. (8 glasses) of fluid/clear liquids during the day to prevent dehydration and help the prep work efficiently.  CLEAR LIQUIDS INCLUDE: Water Jello Ice Popsicles Tea (sugar ok, no milk/cream) Powdered fruit flavored drinks Coffee (sugar ok, no milk/cream) Gatorade Juice: apple, white grape, white cranberry  Lemonade Clear bullion, consomm, broth Carbonated beverages (any kind) Strained chicken noodle soup Hard Candy  4   Mix the entire bottle of Miralax with 64 oz. of Gatorade/Powerade in the morning and put in the refrigerator to chill.  5   At 3:00 pm take 2 Dulcolax/Bisacodyl tablets.  6   At 4:30 pm take one Reglan/Metoclopramide tablet.  7  Starting at 5:00 pm drink one 8 oz glass of the Miralax mixture every 15-20 minutes until you have finished drinking the entire 64 oz.  You should finish drinking prep around 7:30 or 8:00 pm.  8   If you are nauseated, you may take the 2nd Reglan/Metoclopramide  tablet at 6:30 pm.        9    At 8:00 pm take 2 more DULCOLAX/Bisacodyl tablets.     THE DAY OF YOUR PROCEDURE      DATE:  Friday 5/13  You may drink clear liquids until 8:30 am  (2 HOURS BEFORE PROCEDURE).   MEDICATION INSTRUCTIONS  Unless otherwise instructed, you should take regular prescription medications with a small sip of water as early as possible the morning of your procedure.    Additional medication instructions: do not take Furosemide the day of the procedure.         OTHER INSTRUCTIONS  You will need a responsible adult at least 63 years of age to accompany you and drive you home.   This person must remain in the waiting room during your procedure.  Wear loose fitting clothing that is easily removed.  Leave jewelry and other valuables at home.  However, you may wish to bring a book to read or an iPod/MP3 player to listen to music as you wait for your procedure to start.  Remove all body piercing jewelry and leave at home.  Total time from sign-in until discharge is approximately 2-3 hours.  You should go home directly after your procedure and rest.  You can resume normal activities the day after  your procedure.  The day of your procedure you should not:   Drive   Make legal decisions   Operate machinery   Drink alcohol   Return to work  You will receive specific instructions about eating, activities and medications before you leave.   The above instructions have been reviewed and explained to me by   _______________________    I fully understand and can verbalize these instructions _____________________________ Date _______

## 2010-05-23 NOTE — Medication Information (Signed)
Summary: Prior Authorization Request for Ambien  Prior Authorization Request for Ambien   Imported By: Maryln Gottron 08/19/2009 09:53:03  _____________________________________________________________________  External Attachment:    Type:   Image     Comment:   External Document

## 2010-05-23 NOTE — Progress Notes (Signed)
Summary: need 5 pills each  Phone Note Refill Request Call back at [phone] Message from:  Patient---live call  Refills Requested: Medication #1:  LIPITOR 40 MG  TABS once daily  Medication #2:  AMBIEN 10 MG  TABS 1 at [place]. Needs 5 pills each. call [facility]---[phone]. need today. call pt when done.  Initial call taken by: Warnell Forester,  November 17, 2009 2:34 PM  Follow-up for Phone Call        Rx called to pharmacy Follow-up by: Kern Reap CMA Duncan Dull),  November 17, 2009 2:56 PM

## 2010-05-23 NOTE — Progress Notes (Signed)
Summary: Medication refill  Phone Note Call from Patient Call back at [phone]   Caller: Patient Reason for Call: Refill Medication Summary of Call: coming for a physical on 07/29/09 - but needs Lipator and Nexium refilled before her appointment. Initial call taken by: Everrett Coombe,  July 14, 2009 1:55 PM    Prescriptions: LIPITOR 40 MG  TABS (ATORVASTATIN CALCIUM) once daily  #30 x 0   Entered by:   Kern Reap CMA (AAMA)   Authorized by:   Birdie Sons MD   Signed by:   Kern Reap CMA (AAMA) on 07/14/2009   Method used:   Electronically to        [facility]* (retail)       [address]       [place], [place]  [id number]       Ph: [phone]       Fax: [phone]   RxID:   [id number] NEXIUM 40 MG  CPDR (ESOMEPRAZOLE MAGNESIUM) 1 once daily  #30 x 0   Entered by:   Kern Reap CMA (AAMA)   Authorized by:   Birdie Sons MD   Signed by:   Kern Reap CMA (AAMA) on 07/14/2009   Method used:   Electronically to        [facility]* (retail)       [address]       [place], [place]  [id number]       Ph: [phone]       Fax: [phone]   RxID:   [id number]

## 2010-05-31 NOTE — Progress Notes (Signed)
Summary: question about lipitor  Phone Note Call from Patient Call back at Work Phone [phone]   Caller: Patient Call For: Chinara Hertzberg Summary of Call: pt had a question about Lipitor.  She received the generic by mail order and she wanted to make sure it was not going to change her cholesterol readings.  Told pt there should not be any change.  Scheduled fasting labs in April Initial call taken by: Alfred Levins, CMA,  May 22, 2010 3:00 PM

## 2010-07-04 LAB — CBC
HCT: 40.4 % (ref 36.0–46.0)
Hemoglobin: 13.6 g/dL (ref 12.0–15.0)
MCHC: 33.6 g/dL (ref 30.0–36.0)

## 2010-07-04 LAB — COMPREHENSIVE METABOLIC PANEL
ALT: 25 U/L (ref 0–35)
Alkaline Phosphatase: 63 U/L (ref 39–117)
CO2: 28 mEq/L (ref 19–32)
Calcium: 9.3 mg/dL (ref 8.4–10.5)
Chloride: 107 mEq/L (ref 96–112)
GFR calc non Af Amer: 56 mL/min — ABNORMAL LOW (ref >60)
Glucose, Bld: 91 mg/dL (ref 70–99)
Potassium: 4.2 mEq/L (ref 3.5–5.1)
Sodium: 142 mEq/L (ref 135–145)
Total Bilirubin: 0.4 mg/dL (ref 0.3–1.2)

## 2010-08-08 ENCOUNTER — Other Ambulatory Visit (INDEPENDENT_AMBULATORY_CARE_PROVIDER_SITE_OTHER): Payer: Federal, State, Local not specified - PPO | Admitting: Internal Medicine

## 2010-08-08 DIAGNOSIS — Z0000 Encounter for general adult medical examination without abnormal findings: Secondary | ICD-10-CM

## 2010-08-08 LAB — HEPATIC FUNCTION PANEL
AST: 18 U/L (ref 0–37)
Albumin: 4 g/dL (ref 3.5–5.2)
Alkaline Phosphatase: 57 U/L (ref 39–117)
Bilirubin, Direct: 0.1 mg/dL (ref 0.0–0.3)
Total Protein: 6.4 g/dL (ref 6.0–8.3)

## 2010-08-08 LAB — CBC WITH DIFFERENTIAL/PLATELET
Basophils Relative: 0.7 % (ref 0.0–3.0)
Hemoglobin: 13.9 g/dL (ref 12.0–15.0)
Lymphocytes Relative: 42.1 % (ref 12.0–46.0)
Monocytes Relative: 7 % (ref 3.0–12.0)
Neutro Abs: 2.6 10*3/uL (ref 1.4–7.7)
Neutrophils Relative %: 47 % (ref 43.0–77.0)
RBC: 4.37 Mil/uL (ref 3.87–5.11)
WBC: 5.6 10*3/uL (ref 4.5–10.5)

## 2010-08-08 LAB — POCT URINALYSIS DIPSTICK
Bilirubin, UA: NEGATIVE
Blood, UA: NEGATIVE
Leukocytes, UA: NEGATIVE
Nitrite, UA: NEGATIVE
Urobilinogen, UA: 0.2
pH, UA: 5.5

## 2010-08-08 LAB — BASIC METABOLIC PANEL
Calcium: 9.5 mg/dL (ref 8.4–10.5)
GFR: 60.26 mL/min (ref >60.00)
Sodium: 141 mEq/L (ref 135–145)

## 2010-08-08 LAB — LIPID PANEL
Total CHOL/HDL Ratio: 4
VLDL: 21 mg/dL (ref 0.0–40.0)

## 2010-08-08 LAB — TSH: TSH: 3.35 u[IU]/mL (ref 0.35–5.50)

## 2010-08-18 ENCOUNTER — Encounter: Payer: Self-pay | Admitting: Internal Medicine

## 2010-09-04 ENCOUNTER — Encounter: Payer: Self-pay | Admitting: Internal Medicine

## 2010-09-05 ENCOUNTER — Encounter: Payer: Self-pay | Admitting: Internal Medicine

## 2010-09-05 ENCOUNTER — Ambulatory Visit (INDEPENDENT_AMBULATORY_CARE_PROVIDER_SITE_OTHER): Payer: Federal, State, Local not specified - PPO | Admitting: Internal Medicine

## 2010-09-05 VITALS — BP 124/94 | HR 92 | Temp 98.3°F | Ht 62.75 in | Wt 172.0 lb

## 2010-09-05 DIAGNOSIS — Z0000 Encounter for general adult medical examination without abnormal findings: Secondary | ICD-10-CM

## 2010-09-05 DIAGNOSIS — K2270 Barrett's esophagus without dysplasia: Secondary | ICD-10-CM

## 2010-09-05 LAB — HM MAMMOGRAPHY

## 2010-09-05 MED ORDER — ATORVASTATIN CALCIUM 40 MG PO TABS: 40.0000 mg | ORAL_TABLET | Freq: Every day | ORAL | Status: DC

## 2010-09-05 MED ORDER — ESOMEPRAZOLE MAGNESIUM 40 MG PO CPDR: 40.0000 mg | DELAYED_RELEASE_CAPSULE | Freq: Every day | ORAL | Status: DC

## 2010-09-05 NOTE — Progress Notes (Signed)
  Subjective:    Patient ID: Megan Booth, female    DOB: 03/13/1946, 65 y.o.   MRN: [id number]  HPI cpx  Past Medical History  Diagnosis Date  . Hyperlipidemia   . GERD (gastroesophageal reflux disease)   . Hypothyroidism    Past Surgical History  Procedure Date  . Lasik   . Breast enhancement surgery   . Dilation and curettage of uterus     irregular periods    reports that she has never smoked. She does not have any smokeless tobacco history on file. She reports that she drinks alcohol. She reports that she does not use illicit drugs. family history includes Cancer in her father; Dementia in her mother; Depression in her mother; Diabetes in her mother; Heart disease in her mother; Hypertension in her mother; and Seizures in her brother. No Known Allergies  Review of Systems  patient denies chest pain, shortness of breath, orthopnea. Denies lower extremity edema, abdominal pain, change in appetite, change in bowel movements. Patient denies rashes, musculoskeletal complaints. No other specific complaints in a complete review of systems.      Objective:   Physical Exam  Well-developed well-nourished female in no acute distress. HEENT exam atraumatic, normocephalic, extraocular muscles are intact. Neck is supple. No jugular venous distention no thyromegaly. Chest clear to auscultation without increased work of breathing. Cardiac exam S1 and S2 are regular. Abdominal exam active bowel sounds, soft, nontender. Extremities no edema. Neurologic exam she is alert without any motor sensory deficits. Gait is normal.     Assessment & Plan:  Well visit--health maint UTD Discussed need for weight loss

## 2010-09-05 NOTE — Assessment & Plan Note (Signed)
Needs f/u Stay on ppi Refer to gi

## 2010-09-06 ENCOUNTER — Encounter: Payer: Self-pay | Admitting: Internal Medicine

## 2010-09-08 NOTE — Op Note (Signed)
NAMECYNCERE, RUHE              ACCOUNT NO.:  [id number]   MEDICAL RECORD NO.:  [id number]          PATIENT TYPE:  AMB   LOCATION:  SDC                           FACILITY:  [facility]   PHYSICIAN:  Luvenia Redden, M.D.   DATE OF BIRTH:  25-Oct-1945   DATE OF PROCEDURE:  10/04/2004  DATE OF DISCHARGE:  10/04/2004                                 OPERATIVE REPORT   PREOPERATIVE DIAGNOSIS:  Postmenopausal bleeding.   POSTOPERATIVE DIAGNOSIS:  Postmenopausal bleeding.   OPERATION:  Dilatation and curettage.   SURGEON:  Luvenia Redden, M.D.   PROCEDURE:  Under sedation, the patient was examined.  The uterus was  anterior, upper limits of normal size, and no adnexal masses were palpated.  A paracervical block was performed using 1% lidocaine injected at the 4 and  8 o'clock positions paracervically.  The uterus sounded to a depth of 3-1/2  inches.  The cervix was dilated.  The endocervical canal was curetted with a  sharp curette, and this was sent as a separate specimen.  The endometrial  cavity was explored with the polyp forceps and no polypoid material was  obtained.  The endometrial cavity was then scraped with a sharp curette and  a small amount of endometrial tissue was obtained.  The cavity was then  wiped with a dry sponge.  The procedure was terminated.  Blood loss was  minimal, none was replaced.  The patient was removed to recovery in good  condition.       WSB/MEDQ  D:  11/07/2004  T:  11/07/2004  Job:  [id number]

## 2010-09-08 NOTE — Op Note (Signed)
Megan, Booth              ACCOUNT NO.:  [id number]   MEDICAL RECORD NO.:  [id number]          PATIENT TYPE:  AMB   LOCATION:  ENDO                         FACILITY:  [facility]   PHYSICIAN:  Georgiana Spinner, M.D.    DATE OF BIRTH:  07/07/1945   DATE OF PROCEDURE:  03/29/2005  DATE OF DISCHARGE:                                 OPERATIVE REPORT   PROCEDURE:  Upper endoscopy with biopsy.   INDICATIONS:  GERD.   ANESTHESIA:  Demerol 75 mg, Versed 7.5 mg.   PROCEDURE:  With the patient mildly sedated in the left lateral decubitus  position, the Olympus videoscopic endoscope was inserted in the mouth,  advanced then under direct vision into the esophagus, which appeared normal.  I really did not see any clear-cut evidence of Barrett's esophagus.  We then  entered into the stomach.  The fundus, body, antrum, duodenal bulb and  second portion of the duodenum were visualized.  From this point the  endoscope was slowly withdrawn taking circumferential views of the duodenal  mucosa until the endoscope had been pulled back into the stomach, placed in  retroflexion to view the stomach from below.  The endoscope was then  straightened and withdrawn taking circumferential views of the remaining  gastric and esophageal mucosa, stopping in the fundus, where polyps were  seen, photographed and biopsied.  As well, we also stopped in the distal  esophagus to sample the squamocolumnar junction with biopsies.  The  endoscope was withdrawn.  The patient's vital signs and pulse oximetry  remained stable.  The patient tolerated the procedure well without apparent  complication.   FINDINGS:  Fundic polyps, otherwise an unremarkable examination with  biopsies taken as described.   Await biopsy report.  The patient will call me for results and follow up  with me as an outpatient.           ______________________________  Georgiana Spinner, M.D.     GMO/MEDQ  D:  03/29/2005  T:  03/29/2005  Job:   [id number]

## 2010-09-08 NOTE — Op Note (Signed)
   NAME:  Megan Booth, Megan Booth                        ACCOUNT NO.:  [id number]   MEDICAL RECORD NO.:  [id number]                   PATIENT TYPE:  AMB   LOCATION:  ENDO                                 [facility]ACILITY:  [facility]   PHYSICIAN:  Georgiana Spinner, M.D.                 DATE OF BIRTH:  1945-10-07   DATE OF PROCEDURE:  01/05/2002  DATE OF DISCHARGE:                                 OPERATIVE REPORT   PROCEDURE:  Colonoscopy.   INDICATIONS:  Rectal bleeding .   ANESTHESIA:  Demerol 25 mg, Versed 2 mg additionally.   DESCRIPTION OF PROCEDURE:  With the patient mildly sedated in the left  lateral decubitus position, the Olympus videoscopic colonoscope was inserted  into the rectum  and passed under direct vision to the cecum -- identified  by the ileocecal valve and appendiceal orifice, both of which are  photographed.  From this point the colonoscope was slowly withdrawn, taking  circumferential views of the colonic mucosa and stopping only in the cecum -  - which appeared normal in direct and showed hemorrhoids in retroflex view.  The scope was straightened and withdrawn.  The patient's vital signs and  pulse oximetry remained stable.  The patient tolerated the procedure well  and all without apparent complications.   FINDINGS:  Internal hemorrhoids, otherwise unremarkable colonoscopic  examination.   PLAN:  See endoscopy note for further details of follow-up.                                                 Georgiana Spinner, M.D.    GMO/MEDQ  D:  01/05/2002  T:  01/05/2002  Job:  [id number]

## 2010-09-08 NOTE — Op Note (Signed)
NAME:  Megan Booth, Megan Booth                        ACCOUNT NO.:  [id number]   MEDICAL RECORD NO.:  [id number]                   PATIENT TYPE:  AMB   LOCATION:  ENDO                                 FACILITY:  [facility]   PHYSICIAN:  Georgiana Spinner, M.D.                 DATE OF BIRTH:  Dec 24, 1945   DATE OF PROCEDURE:  DATE OF DISCHARGE:                                 OPERATIVE REPORT   PROCEDURE:  Upper endoscopy with biopsy.   INDICATIONS FOR PROCEDURE:  Barrett's by biopsy previously.   ANESTHESIA:  Demerol 50, Versed 5 mg.   DESCRIPTION OF PROCEDURE:  With the patient mildly sedated in the left  lateral decubitus position, the Olympus videoscopic endoscope was inserted  in the mouth and passed under direct vision through the esophagus.  A small  hiatal hernia was seen.  The distal esophagus was well photographed.  We  biopsied around the perimeter of the squamocolumnar junction, we entered  into the stomach.  The fundus, body, antrum, duodenal bulb and second  portion of the duodenum were visualized.  From this point, the endoscope was  slowly withdrawn taking circumferential views of the duodenal mucosa until  the endoscope was then pulled back in the stomach, placed in retroflexion to  view the stomach from below. The endoscope was then straightened and  withdrawn taking circumferential views of the remaining gastric and  esophageal mucosa stopping to photograph the biopsies and fundic polyps. The  patient's vital signs and pulse oximeter remained stable. The patient  tolerated the procedure well without apparent complications.   FINDINGS:  Fundic polyps biopsied. Biopsy of the distal esophagus.   PLAN:  Await biopsy report.  The patient will call me for results and  followup with me as an outpatient.                                               Georgiana Spinner, M.D.    GMO/MEDQ  D:  04/09/2003  T:  04/09/2003  Job:  [id number]

## 2010-09-08 NOTE — Op Note (Signed)
   NAME:  Megan Booth, Megan Booth                        ACCOUNT NO.:  [id number]   MEDICAL RECORD NO.:  [id number]                   PATIENT TYPE:  AMB   LOCATION:  ENDO                                 [facility]ACILITY:  [facility]   PHYSICIAN:  Georgiana Spinner, M.D.                 DATE OF BIRTH:  [birth year]   DATE OF PROCEDURE:  01/05/2002  DATE OF DISCHARGE:                                 OPERATIVE REPORT   PROCEDURE:  Upper endoscopy.   INDICATIONS:  Gastroesophageal reflux disease, heme-positive stools.   ANESTHESIA:  Demerol 50, Versed 4 mg.   DESCRIPTION OF PROCEDURE:  With patient mildly sedated in the left lateral  decubitus position, the Olympus videoscopic endoscope was inserted in the  mouth, passed under direct vision through the esophagus, which appeared  normal except for the distal esophagus where there was a change of  esophagitis seen, photographed, and biopsied.  We entered into the stomach.  Fundus, body, antrum, duodenal bulb, and second portion of duodenum appeared  normal.  From this point, the endoscope was slowly withdrawn, taking  circumferential views of the duodenal mucosa until the endoscope then pulled  back into the stomach, placed in retroflexion to view the stomach from  below, and a sliding hiatal hernia was seen and photographed.  The endoscope  was then straightened and withdrawn, taking circumferential views of the  remaining gastric and esophageal mucosa, stopping to biopsy once again the  distal esophagus.  The patient's vital signs and pulse oximeter remained  stable.  The patient tolerated the procedure well without apparent  complications.   FINDINGS:  Distal esophagitis above a hiatal hernia, biopsied.  Await biopsy  report.  The patient will call me for results and follow up with me as an  outpatient.  Proceed to colonoscopy as planned.                                               Georgiana Spinner, M.D.    GMO/MEDQ  D:  01/05/2002  T:  01/05/2002  Job:   [id number]

## 2010-09-14 ENCOUNTER — Other Ambulatory Visit: Payer: Self-pay | Admitting: Internal Medicine

## 2010-09-14 DIAGNOSIS — G4700 Insomnia, unspecified: Secondary | ICD-10-CM

## 2010-09-14 NOTE — Telephone Encounter (Signed)
Pt came by office and is req refills for Furosemide 20 mg and Ambien 10 mg. Pls call in to [facility].

## 2010-09-15 MED ORDER — FUROSEMIDE 20 MG PO TABS: 20.0000 mg | ORAL_TABLET | Freq: Every day | ORAL | Status: DC | PRN

## 2010-09-15 MED ORDER — ZOLPIDEM TARTRATE 10 MG PO TABS: 10.0000 mg | ORAL_TABLET | Freq: Every evening | ORAL | Status: DC | PRN

## 2010-09-15 NOTE — Telephone Encounter (Signed)
rx called in

## 2010-09-22 ENCOUNTER — Encounter: Payer: Self-pay | Admitting: Internal Medicine

## 2010-11-06 ENCOUNTER — Encounter: Payer: Self-pay | Admitting: Internal Medicine

## 2010-11-06 ENCOUNTER — Ambulatory Visit (INDEPENDENT_AMBULATORY_CARE_PROVIDER_SITE_OTHER): Payer: Federal, State, Local not specified - PPO | Admitting: Internal Medicine

## 2010-11-06 DIAGNOSIS — E785 Hyperlipidemia, unspecified: Secondary | ICD-10-CM

## 2010-11-06 DIAGNOSIS — H9319 Tinnitus, unspecified ear: Secondary | ICD-10-CM

## 2010-11-06 DIAGNOSIS — E039 Hypothyroidism, unspecified: Secondary | ICD-10-CM

## 2010-11-06 DIAGNOSIS — H9311 Tinnitus, right ear: Secondary | ICD-10-CM

## 2010-11-06 NOTE — Patient Instructions (Signed)
Call or return to clinic prn if these symptoms worsen or fail to improve as anticipated.

## 2010-11-06 NOTE — Progress Notes (Signed)
  Subjective:    Patient ID: Megan Booth, female    DOB: 12/18/1945, 65 y.o.   MRN: [id number]  HPI  65 year old patient who presents with an approximate three- week history of a pulse and sensation of the right ear. She denies any hearing loss. She denies any tinnitus. Symptoms wax and wane but she feels that the symptoms are there continuously. Denies any real ear discomfort. No sinus symptoms or headache    Review of Systems  Constitutional: Negative.   HENT: Negative for hearing loss, congestion, sore throat, rhinorrhea, dental problem, sinus pressure and tinnitus.   Eyes: Negative for pain, discharge and visual disturbance.  Respiratory: Negative for cough and shortness of breath.   Cardiovascular: Negative for chest pain, palpitations and leg swelling.  Gastrointestinal: Negative for nausea, vomiting, abdominal pain, diarrhea, constipation, blood in stool and abdominal distention.  Genitourinary: Negative for dysuria, urgency, frequency, hematuria, flank pain, vaginal bleeding, vaginal discharge, difficulty urinating, vaginal pain and pelvic pain.  Musculoskeletal: Negative for joint swelling, arthralgias and gait problem.  Skin: Negative for rash.  Neurological: Negative for dizziness, syncope, speech difficulty, weakness, numbness and headaches.  Hematological: Negative for adenopathy.  Psychiatric/Behavioral: Negative for behavioral problems, dysphoric mood and agitation. The patient is not nervous/anxious.        Objective:   Physical Exam  Constitutional: She appears well-developed and well-nourished. No distress.  HENT:  Head: Normocephalic and atraumatic.  Right Ear: External ear normal.  Left Ear: External ear normal.  Nose: Nose normal.  Mouth/Throat: Oropharynx is clear and moist.       Canals and tympanic membranes were normal Weber did not lateralize  Neck: Neck supple.       No carotid or supraclavicular bruits   Stroke appeared normal  Cardiovascular:     No murmur  Lymphadenopathy:    She has no cervical adenopathy.          Assessment & Plan:    patient describes a 3 week history of a pulsation in her right ear. Doubt any pathology. We'll clinically observe and if symptoms persist or further symptoms develop we'll consider evaluation further

## 2011-02-19 ENCOUNTER — Encounter: Payer: Self-pay | Admitting: Internal Medicine

## 2011-02-20 ENCOUNTER — Encounter: Payer: Self-pay | Admitting: Internal Medicine

## 2011-02-20 ENCOUNTER — Ambulatory Visit (INDEPENDENT_AMBULATORY_CARE_PROVIDER_SITE_OTHER): Payer: Federal, State, Local not specified - PPO | Admitting: Internal Medicine

## 2011-02-20 VITALS — BP 114/80 | HR 88 | Temp 97.9°F | Wt 153.0 lb

## 2011-02-20 DIAGNOSIS — R5383 Other fatigue: Secondary | ICD-10-CM

## 2011-02-20 LAB — BASIC METABOLIC PANEL
BUN: 16 mg/dL (ref 6–23)
CO2: 28 mEq/L (ref 19–32)
Chloride: 106 mEq/L (ref 96–112)
Creatinine, Ser: 0.8 mg/dL (ref 0.4–1.2)
Glucose, Bld: 101 mg/dL — ABNORMAL HIGH (ref 70–99)
Potassium: 4.2 mEq/L (ref 3.5–5.1)

## 2011-02-20 LAB — SEDIMENTATION RATE: Sed Rate: 18 mm/hr (ref 0–22)

## 2011-02-20 LAB — HEPATIC FUNCTION PANEL
ALT: 27 U/L (ref 0–35)
AST: 20 U/L (ref 0–37)
Albumin: 4.5 g/dL (ref 3.5–5.2)
Total Bilirubin: 0.3 mg/dL (ref 0.3–1.2)
Total Protein: 7.9 g/dL (ref 6.0–8.3)

## 2011-02-20 LAB — CBC WITH DIFFERENTIAL/PLATELET
Basophils Relative: 0.6 % (ref 0.0–3.0)
Eosinophils Relative: 1.7 % (ref 0.0–5.0)
Lymphocytes Relative: 34 % (ref 12.0–46.0)
MCV: 93.4 fl (ref 78.0–100.0)
Monocytes Absolute: 0.5 10*3/uL (ref 0.1–1.0)
Monocytes Relative: 6.7 % (ref 3.0–12.0)
Neutrophils Relative %: 57 % (ref 43.0–77.0)
Platelets: 325 10*3/uL (ref 150.0–400.0)
RBC: 4.68 Mil/uL (ref 3.87–5.11)
WBC: 6.9 10*3/uL (ref 4.5–10.5)

## 2011-02-20 LAB — TSH: TSH: 3.68 u[IU]/mL (ref 0.35–5.50)

## 2011-02-20 NOTE — Progress Notes (Signed)
  Subjective:    Patient ID: Megan Booth, female    DOB: 1946/03/28, 65 y.o.   MRN: [id number]  HPI  One week hx of not feeling well. Admits to fatigue/no energy, painful bumps in mouth (resolving with salt water), chills at night---no documented fever. Sxs described as "moderate"  Past Medical History  Diagnosis Date  . Hyperlipidemia   . GERD (gastroesophageal reflux disease)   . Hypothyroidism    Past Surgical History  Procedure Date  . Lasik   . Breast enhancement surgery   . Dilation and curettage of uterus     irregular periods    reports that she has never smoked. She has never used smokeless tobacco. She reports that she drinks alcohol. She reports that she does not use illicit drugs. family history includes Cancer in her father; Dementia in her mother; Depression in her mother; Diabetes in her mother; Heart disease in her mother; Hypertension in her mother; and Seizures in her brother. No Known Allergies  Review of Systems  patient denies chest pain, shortness of breath, orthopnea. Denies lower extremity edema, abdominal pain, change in appetite, change in bowel movements. Patient denies rashes, musculoskeletal complaints. No other specific complaints in a complete review of systems.      Objective:   Physical Exam  Well-developed well-nourished female in no acute distress. HEENT exam atraumatic, normocephalic, extraocular muscles are intact. Neck is supple. No jugular venous distention no thyromegaly. Chest clear to auscultation without increased work of breathing. Cardiac exam S1 and S2 are regular. Abdominal exam active bowel sounds, soft, nontender. Extremities no edema. Neurologic exam she is alert without any motor sensory deficits. Gait is normal.     Assessment & Plan:  Fatigue--suspect viral Check labs today  She has a soft, mobile mass at the frenulum. ? Cause. If still there in 10 days she will call me and I'll refer to ENT

## 2011-02-26 ENCOUNTER — Ambulatory Visit (INDEPENDENT_AMBULATORY_CARE_PROVIDER_SITE_OTHER): Payer: Federal, State, Local not specified - PPO | Admitting: Family Medicine

## 2011-02-26 ENCOUNTER — Encounter: Payer: Self-pay | Admitting: Family Medicine

## 2011-02-26 VITALS — BP 100/72 | Temp 98.2°F | Wt 157.0 lb

## 2011-02-26 DIAGNOSIS — H6690 Otitis media, unspecified, unspecified ear: Secondary | ICD-10-CM

## 2011-02-26 DIAGNOSIS — H6692 Otitis media, unspecified, left ear: Secondary | ICD-10-CM

## 2011-02-26 DIAGNOSIS — R05 Cough: Secondary | ICD-10-CM

## 2011-02-26 MED ORDER — AMOXICILLIN 875 MG PO TABS: 875.0000 mg | ORAL_TABLET | Freq: Two times a day (BID) | ORAL | Status: AC

## 2011-02-26 MED ORDER — HYDROCODONE-HOMATROPINE 5-1.5 MG/5ML PO SYRP: 5.0000 mL | ORAL_SOLUTION | Freq: Four times a day (QID) | ORAL | Status: AC | PRN

## 2011-02-26 NOTE — Progress Notes (Signed)
  Subjective:    Patient ID: Megan Booth, female    DOB: 03-Aug-1945, 65 y.o.   MRN: [id number]  HPI  Worsening left ear pain. Refer to prior note. Probable recent viral illness. Still has some malaise and dry cough. Initially had right ear pain and now left earache. Occasional vertigo. No fever. Persistent dry cough. Poor sleep quality. No some cough syrup without much improvement. Patient is nonsmoker. No history of reactive airway problems.  Sublingual mild swelling with no drainage.  No real pain.  Past Medical History  Diagnosis Date  . Hyperlipidemia   . GERD (gastroesophageal reflux disease)   . Hypothyroidism    Past Surgical History  Procedure Date  . Lasik   . Breast enhancement surgery   . Dilation and curettage of uterus     irregular periods    reports that she has never smoked. She has never used smokeless tobacco. She reports that she drinks alcohol. She reports that she does not use illicit drugs. family history includes Cancer in her father; Dementia in her mother; Depression in her mother; Diabetes in her mother; Heart disease in her mother; Hypertension in her mother; and Seizures in her brother. No Known Allergies   Review of Systems  Constitutional: Positive for fatigue. Negative for fever and chills.  HENT: Positive for ear pain. Negative for sore throat and trouble swallowing.   Respiratory: Positive for cough. Negative for wheezing.   Cardiovascular: Negative for chest pain.  Hematological: Negative for adenopathy.       Objective:   Physical Exam  Constitutional: She appears well-developed and well-nourished.  HENT:  Right Ear: External ear normal.  Mouth/Throat: Oropharynx is clear and moist.       Left ear drum reveals large bullous lesion with thick yellow colored fluid  Sublingual area reveals rounded mobile nontender cystic lesion- ? Mucocele  Neck: Neck supple.  Cardiovascular: Normal rate and regular rhythm.   Pulmonary/Chest: Effort  normal and breath sounds normal. No respiratory distress. She has no wheezes. She has no rales.  Lymphadenopathy:    She has no cervical adenopathy.          Assessment & Plan:  #1 left otitis media. Large bullous lesion with purulent-looking fluid. Amoxicillin 875 mg twice daily for 10 days #2 dry cough. Suspect viral. Hycodan for nighttime suppression of cough #3 probable mucocele sublingual region. No sign of abscess. Reassurance

## 2011-03-20 ENCOUNTER — Encounter: Payer: Self-pay | Admitting: *Deleted

## 2011-03-26 ENCOUNTER — Other Ambulatory Visit (INDEPENDENT_AMBULATORY_CARE_PROVIDER_SITE_OTHER): Payer: Federal, State, Local not specified - PPO

## 2011-03-26 ENCOUNTER — Encounter: Payer: Self-pay | Admitting: Internal Medicine

## 2011-03-26 ENCOUNTER — Ambulatory Visit (INDEPENDENT_AMBULATORY_CARE_PROVIDER_SITE_OTHER): Payer: Federal, State, Local not specified - PPO | Admitting: Internal Medicine

## 2011-03-26 DIAGNOSIS — K219 Gastro-esophageal reflux disease without esophagitis: Secondary | ICD-10-CM

## 2011-03-26 DIAGNOSIS — E538 Deficiency of other specified B group vitamins: Secondary | ICD-10-CM

## 2011-03-26 LAB — VITAMIN B12: Vitamin B-12: 292 pg/mL (ref 211–911)

## 2011-03-26 MED ORDER — ESOMEPRAZOLE MAGNESIUM 40 MG PO CPDR: 40.0000 mg | DELAYED_RELEASE_CAPSULE | Freq: Every day | ORAL | Status: DC

## 2011-03-26 NOTE — Progress Notes (Signed)
Megan Booth 30-Jun-1945 MRN [id number]    History of Present Illness:  This is a 65 year old white female with chronic gastroesophageal reflux disease who was previously followed by Dr.Orr. Her last upper endoscopy on 03/29/2005 showed a few fundic gland polyps, but was an otherwise normal exam. Biopsies showed no evidence of Barrett's esophagus. There was mild esophagitis. Her symptoms have been controlled on Nexium 40 mg every morning. She denies hoarseness, nocturnal cough or odynophagia. She has experienced heartburn when she forgets to take her Nexium. Her recent blood tests show a normal hemoglobin. She is up-to-date on her colonoscopy which was done in 2004 and again in May 2011 showing moderately severe diverticulosis.   Past Medical History  Diagnosis Date  . Hyperlipidemia   . GERD (gastroesophageal reflux disease)   . Hypothyroidism   . Diverticulosis   . Internal hemorrhoid   . Barrett esophagus 2003    Dr. Virginia Booth  . Sleep apnea   . Hx of colonic polyps     Dr. Virginia Booth    Past Surgical History  Procedure Date  . Lasik   . Breast enhancement surgery   . Dilation and curettage of uterus     x 2  . Hysteroscopy   . Tubal ligation     reports that she has never smoked. She has never used smokeless tobacco. She reports that she does not drink alcohol or use illicit drugs. family history includes Dementia in her mother; Depression in her mother; Diabetes in her mother; Heart disease in her mother; Hypertension in her mother; Lung cancer in her father; and Seizures in her brother.  There is no history of Colon cancer. No Known Allergies      Review of Systems: Negative for dysphagia, odynophagia chest pain or shortness of breath  The remainder of the 10 point ROS is negative except as outlined in H&P   Physical Exam: General appearance  Well developed, in no distress. Eyes- non icteric. HEENT nontraumatic, normocephalic. Mouth no lesions, tongue papillated, no  cheilosis. Neck supple without adenopathy, thyroid not enlarged, no carotid bruits, no JVD. Lungs Clear to auscultation bilaterally. Cor normal S1, normal S2, regular rhythm, no murmur,  quiet precordium. Abdomen: Soft nontender abdomen with normal active bowel sounds. No distention. No tenderness. Liver edge at costal margin. Rectal: Not done. Extremities no pedal edema. Skin no lesions. Neurological alert and oriented x 3. Psychological normal mood and affect.  Assessment and Plan:  Problem #1 Chronic gastroesophageal reflux under good control with Nexium 40 mg a day. We have discussed decreasing her Nexium to every other day or on a when necessary dose. She will try to reduce her Nexium to the minimum effective dose. We will  refill her Nexium. In absence of Barrett's esophagus there are no guidelines for repeating an upper endoscopy at this point.We weill check her B12 level.  Problem #2 Colorectal screening. Her last colonoscopy was in May 2011. Her next colonoscopy will be due in 10 years.     03/26/2011 Megan Booth

## 2011-03-26 NOTE — Patient Instructions (Addendum)
We have sent the following medications to your pharmacy for you to pick up at your convenience: Nexium, Your physician has requested that you go to the basement for the following lab work before leaving today: B12 level CC: Dr Cato Mulligan

## 2011-03-27 NOTE — Progress Notes (Signed)
I have advised patient of borderline low b12 level and her need for b12 supplementation. Patient verbalizes understanding and has scheduled her first b12 for this week. Patient has also been advised that she will need labs around 09/2010.

## 2011-03-29 ENCOUNTER — Ambulatory Visit (INDEPENDENT_AMBULATORY_CARE_PROVIDER_SITE_OTHER): Payer: Federal, State, Local not specified - PPO | Admitting: Internal Medicine

## 2011-03-29 DIAGNOSIS — E538 Deficiency of other specified B group vitamins: Secondary | ICD-10-CM

## 2011-03-29 MED ORDER — CYANOCOBALAMIN 1000 MCG/ML IJ SOLN
1000.0000 ug | INTRAMUSCULAR | Status: AC
  Administered 2011-03-29 – 2011-08-03 (×5): 1000 ug via INTRAMUSCULAR

## 2011-04-18 ENCOUNTER — Encounter: Payer: Self-pay | Admitting: Internal Medicine

## 2011-04-23 ENCOUNTER — Ambulatory Visit (INDEPENDENT_AMBULATORY_CARE_PROVIDER_SITE_OTHER): Payer: Federal, State, Local not specified - PPO | Admitting: Family

## 2011-04-23 ENCOUNTER — Other Ambulatory Visit: Payer: Self-pay | Admitting: Family

## 2011-04-23 ENCOUNTER — Encounter: Payer: Self-pay | Admitting: Family

## 2011-04-23 VITALS — BP 120/80 | HR 81 | Temp 97.8°F | Wt 160.0 lb

## 2011-04-23 DIAGNOSIS — R300 Dysuria: Secondary | ICD-10-CM

## 2011-04-23 DIAGNOSIS — B373 Candidiasis of vulva and vagina: Secondary | ICD-10-CM

## 2011-04-23 LAB — POCT URINALYSIS DIPSTICK
Glucose, UA: NEGATIVE
Nitrite, UA: NEGATIVE
Spec Grav, UA: >=1.03
Urobilinogen, UA: 0.2

## 2011-04-23 MED ORDER — FLUCONAZOLE 150 MG PO TABS: 150.0000 mg | ORAL_TABLET | Freq: Once | ORAL | Status: DC

## 2011-04-23 NOTE — Patient Instructions (Signed)
1. Apply monistat externally as needed.   Candidal Vulvovaginitis Candidal vulvovaginitis is an infection of the vagina and vulva. The vulva is the skin around the opening of the vagina. This may cause itching and discomfort in and around the vagina.  HOME CARE  Only take medicine as told by your doctor.   Do not have sex (intercourse) until the infection is healed or as told by your doctor.   Practice safe sex.   Tell your sex partner about your infection.   Do not douche or use tampons.   Wear cotton underwear. Do not wear tight pants or panty hose.   Eat yogurt. This may help treat and prevent yeast infections.  GET HELP RIGHT AWAY IF:   You have a fever.   Your problems get worse during treatment or do not get better in 3 days.   You have discomfort, irritation, or itching in your vagina or vulva area.   You have pain after sex.   You start to get belly (abdominal) pain.  MAKE SURE YOU:  Understand these instructions.   Will watch your condition.   Will get help right away if you are not doing well or get worse.  Document Released: 07/06/2008 Document Revised: 12/20/2010 Document Reviewed: 07/06/2008 [facility] Patient Information 2012 [place], [place].

## 2011-04-23 NOTE — Progress Notes (Signed)
  Subjective:    Patient ID: Megan Booth, female    DOB: 03-02-1946, 65 y.o.   MRN: [id number]  HPI A 65 year old white female, nonsmoker, in with complaints of vaginal itching particularly when she urinates 11/2 days. Denies vaginal discharge. Denies any concerns regarding sexually transmitted diseases. She denies any burning with urination, blurry or her urine, back pain or abdominal pain.   Review of Systems  Constitutional: Negative.   Cardiovascular: Negative.   Gastrointestinal: Negative.   Genitourinary: Negative for vaginal discharge.  Psychiatric/Behavioral: Negative.          Objective:   Physical Exam  Constitutional: She appears well-developed and well-nourished.  Cardiovascular: Normal rate, regular rhythm and normal heart sounds.   Pulmonary/Chest: Effort normal and breath sounds normal.  Genitourinary:    There is no tenderness on the right labia. There is no tenderness on the left labia. There is erythema around the vagina. Vaginal discharge found.          Assessment & Plan:  Plan::  Assessment: Vaginitis, Candida  Plan: Diflucan 150 mg by mouth x1. Monistat applied externally when necessary. Patient to call if symptoms worsen or persist. Check a schedule. When necessary

## 2011-04-27 LAB — WET PREP BY MOLECULAR PROBE: Candida species: NEGATIVE

## 2011-04-30 ENCOUNTER — Ambulatory Visit (INDEPENDENT_AMBULATORY_CARE_PROVIDER_SITE_OTHER): Payer: Federal, State, Local not specified - PPO | Admitting: Internal Medicine

## 2011-04-30 DIAGNOSIS — E538 Deficiency of other specified B group vitamins: Secondary | ICD-10-CM

## 2011-05-29 ENCOUNTER — Other Ambulatory Visit: Payer: Self-pay | Admitting: Internal Medicine

## 2011-06-01 ENCOUNTER — Ambulatory Visit (INDEPENDENT_AMBULATORY_CARE_PROVIDER_SITE_OTHER): Payer: Federal, State, Local not specified - PPO | Admitting: Internal Medicine

## 2011-06-01 DIAGNOSIS — E538 Deficiency of other specified B group vitamins: Secondary | ICD-10-CM

## 2011-06-29 ENCOUNTER — Ambulatory Visit (INDEPENDENT_AMBULATORY_CARE_PROVIDER_SITE_OTHER): Payer: Federal, State, Local not specified - PPO | Admitting: Internal Medicine

## 2011-06-29 DIAGNOSIS — E538 Deficiency of other specified B group vitamins: Secondary | ICD-10-CM

## 2011-08-03 ENCOUNTER — Ambulatory Visit (INDEPENDENT_AMBULATORY_CARE_PROVIDER_SITE_OTHER): Payer: Federal, State, Local not specified - PPO | Admitting: Internal Medicine

## 2011-08-03 DIAGNOSIS — E538 Deficiency of other specified B group vitamins: Secondary | ICD-10-CM

## 2011-08-30 DIAGNOSIS — N904 Leukoplakia of vulva: Secondary | ICD-10-CM | POA: Insufficient documentation

## 2011-09-03 ENCOUNTER — Ambulatory Visit (INDEPENDENT_AMBULATORY_CARE_PROVIDER_SITE_OTHER): Payer: Federal, State, Local not specified - PPO | Admitting: Internal Medicine

## 2011-09-03 DIAGNOSIS — E538 Deficiency of other specified B group vitamins: Secondary | ICD-10-CM

## 2011-09-03 MED ORDER — CYANOCOBALAMIN 1000 MCG/ML IJ SOLN
1000.0000 ug | INTRAMUSCULAR | Status: AC
  Administered 2011-09-03 – 2011-10-05 (×2): 1000 ug via INTRAMUSCULAR

## 2011-09-06 ENCOUNTER — Ambulatory Visit: Payer: Self-pay | Admitting: Obstetrics and Gynecology

## 2011-09-07 ENCOUNTER — Other Ambulatory Visit (INDEPENDENT_AMBULATORY_CARE_PROVIDER_SITE_OTHER): Payer: Federal, State, Local not specified - PPO

## 2011-09-07 DIAGNOSIS — Z0000 Encounter for general adult medical examination without abnormal findings: Secondary | ICD-10-CM

## 2011-09-07 LAB — HEPATIC FUNCTION PANEL
AST: 19 U/L (ref 0–37)
Alkaline Phosphatase: 56 U/L (ref 39–117)
Bilirubin, Direct: 0 mg/dL (ref 0.0–0.3)
Total Bilirubin: 0.6 mg/dL (ref 0.3–1.2)

## 2011-09-07 LAB — LIPID PANEL
HDL: 51.3 mg/dL (ref >39.00)
LDL Cholesterol: 93 mg/dL (ref 0–99)
Total CHOL/HDL Ratio: 3
Triglycerides: 109 mg/dL (ref 0.0–149.0)

## 2011-09-07 LAB — CBC WITH DIFFERENTIAL/PLATELET
Basophils Absolute: 0 10*3/uL (ref 0.0–0.1)
Hemoglobin: 13.3 g/dL (ref 12.0–15.0)
Lymphocytes Relative: 38.7 % (ref 12.0–46.0)
Monocytes Relative: 7.5 % (ref 3.0–12.0)
Platelets: 229 10*3/uL (ref 150.0–400.0)
RDW: 13.4 % (ref 11.5–14.6)

## 2011-09-07 LAB — BASIC METABOLIC PANEL
Calcium: 9 mg/dL (ref 8.4–10.5)
GFR: 60.05 mL/min (ref >60.00)
Glucose, Bld: 94 mg/dL (ref 70–99)
Sodium: 141 mEq/L (ref 135–145)

## 2011-09-07 LAB — POCT URINALYSIS DIPSTICK
Bilirubin, UA: NEGATIVE
Blood, UA: NEGATIVE
Glucose, UA: NEGATIVE
Spec Grav, UA: 1.02

## 2011-09-11 ENCOUNTER — Encounter: Payer: Self-pay | Admitting: Obstetrics and Gynecology

## 2011-09-11 ENCOUNTER — Ambulatory Visit (INDEPENDENT_AMBULATORY_CARE_PROVIDER_SITE_OTHER): Payer: Federal, State, Local not specified - PPO | Admitting: Obstetrics and Gynecology

## 2011-09-11 VITALS — BP 112/72 | Ht 63.0 in | Wt 159.0 lb

## 2011-09-11 DIAGNOSIS — Z01419 Encounter for gynecological examination (general) (routine) without abnormal findings: Secondary | ICD-10-CM

## 2011-09-11 DIAGNOSIS — Z124 Encounter for screening for malignant neoplasm of cervix: Secondary | ICD-10-CM

## 2011-09-11 MED ORDER — NYSTATIN-TRIAMCINOLONE 100000-0.1 UNIT/GM-% EX OINT: TOPICAL_OINTMENT | Freq: Three times a day (TID) | CUTANEOUS | Status: AC | PRN

## 2011-09-11 NOTE — Progress Notes (Signed)
The patient is taking hormone replacement therapy since January 2009 Biest 2.5 mg daily and Progesterone daily The patient  is not taking a Calcium supplement. Post-menopausal bleeding:no  Last Pap: was normal May  2012 Last mammogram: was normal june  2012 Last DEXA scan : T= -1.28 March 2011 Last colonoscopy:was normal May 2011  Urinary symptoms: none Normal bowel movements: Yes Reports abuse at home: No  Subjective:    Megan Booth is a 66 y.o. female G0P0 who presents for annual exam.  The patient has no complaints today. BHRT followed by EP, has appointment next week.  The following portions of the patient's history were reviewed and updated as appropriate: allergies, current medications, past family history, past medical history, past social history, past surgical history and problem list.  Review of Systems A comprehensive review of systems was negative. Gastrointestinal:No change in bowel habits, no abdominal pain, no rectal bleeding Genitourinary:negative for dysuria, frequency, hematuria, nocturia and urinary incontinence    Objective:     BP 112/72  Ht 5\' 3"  (1.6 m)  Wt 159 lb (72.122 kg)  BMI 28.17 kg/m2  Weight:  Wt Readings from Last 1 Encounters:  09/11/11 159 lb (72.122 kg)     BMI: Body mass index is 28.17 kg/(m^2). General Appearance: Alert, appropriate appearance for age. No acute distress HEENT: Grossly normal Neck / Thyroid: Supple, no masses, nodes or enlargement Lungs: clear to auscultation bilaterally Back: No CVA tenderness Breast Exam: No masses or nodes.No dimpling, nipple retraction or discharge. Cardiovascular: Regular rate and rhythm. S1, S2, no murmur Gastrointestinal: Soft, non-tender, no masses or organomegaly Pelvic Exam: Vulva and vagina appear normal. Bimanual exam reveals normal uterus and adnexa. Mild atrophy unchanged and previously biopsied Rectovaginal: normal rectal, no masses Lymphatic Exam: Non-palpable nodes in neck,  clavicular, axillary, or inguinal regions Skin: no rash or abnormalities Neurologic: Normal gait and speech, no tremor  Psychiatric: Alert and oriented, appropriate affect.    Urinalysis:Not done      Assessment:    Normal gyn exam Hormone replacement therapy  seeing EP next week   Plan:    Await pap smear results. Mammogram.  already scheduled  Follow-up:  for annual exam

## 2011-09-14 ENCOUNTER — Encounter: Payer: Federal, State, Local not specified - PPO | Admitting: Internal Medicine

## 2011-09-14 LAB — PAP IG W/ RFLX HPV ASCU

## 2011-09-18 ENCOUNTER — Ambulatory Visit (INDEPENDENT_AMBULATORY_CARE_PROVIDER_SITE_OTHER): Payer: Federal, State, Local not specified - PPO | Admitting: Obstetrics and Gynecology

## 2011-09-18 ENCOUNTER — Encounter: Payer: Self-pay | Admitting: Obstetrics and Gynecology

## 2011-09-18 VITALS — BP 110/70 | HR 72 | Ht 63.0 in | Wt 157.0 lb

## 2011-09-18 DIAGNOSIS — E785 Hyperlipidemia, unspecified: Secondary | ICD-10-CM | POA: Insufficient documentation

## 2011-09-18 DIAGNOSIS — K5730 Diverticulosis of large intestine without perforation or abscess without bleeding: Secondary | ICD-10-CM

## 2011-09-18 DIAGNOSIS — Z860100 Personal history of colon polyps, unspecified: Secondary | ICD-10-CM | POA: Insufficient documentation

## 2011-09-18 DIAGNOSIS — K648 Other hemorrhoids: Secondary | ICD-10-CM

## 2011-09-18 DIAGNOSIS — N951 Menopausal and female climacteric states: Secondary | ICD-10-CM

## 2011-09-18 DIAGNOSIS — F411 Generalized anxiety disorder: Secondary | ICD-10-CM

## 2011-09-18 DIAGNOSIS — K5790 Diverticulosis of intestine, part unspecified, without perforation or abscess without bleeding: Secondary | ICD-10-CM

## 2011-09-18 DIAGNOSIS — E039 Hypothyroidism, unspecified: Secondary | ICD-10-CM | POA: Insufficient documentation

## 2011-09-18 DIAGNOSIS — G4730 Sleep apnea, unspecified: Secondary | ICD-10-CM | POA: Insufficient documentation

## 2011-09-18 DIAGNOSIS — F419 Anxiety disorder, unspecified: Secondary | ICD-10-CM | POA: Insufficient documentation

## 2011-09-18 DIAGNOSIS — K219 Gastro-esophageal reflux disease without esophagitis: Secondary | ICD-10-CM

## 2011-09-18 MED ORDER — AMBULATORY NON FORMULARY MEDICATION: 1.0000 | Freq: Two times a day (BID) | Status: DC
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
## 2011-09-20 ENCOUNTER — Other Ambulatory Visit: Payer: Self-pay | Admitting: Internal Medicine

## 2011-09-20 DIAGNOSIS — E538 Deficiency of other specified B group vitamins: Secondary | ICD-10-CM

## 2011-09-21 ENCOUNTER — Ambulatory Visit (INDEPENDENT_AMBULATORY_CARE_PROVIDER_SITE_OTHER): Payer: Federal, State, Local not specified - PPO | Admitting: Internal Medicine

## 2011-09-21 VITALS — BP 102/70 | HR 76 | Temp 98.4°F | Ht 63.0 in | Wt 154.0 lb

## 2011-09-21 DIAGNOSIS — Z2911 Encounter for prophylactic immunotherapy for respiratory syncytial virus (RSV): Secondary | ICD-10-CM

## 2011-09-21 DIAGNOSIS — Z0000 Encounter for general adult medical examination without abnormal findings: Secondary | ICD-10-CM
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
## 2011-09-25 ENCOUNTER — Other Ambulatory Visit: Payer: Self-pay | Admitting: Internal Medicine

## 2011-09-25 DIAGNOSIS — M5412 Radiculopathy, cervical region: Secondary | ICD-10-CM

## 2011-09-27 ENCOUNTER — Other Ambulatory Visit: Payer: Self-pay | Admitting: Internal Medicine

## 2011-10-01 ENCOUNTER — Ambulatory Visit: Payer: Federal, State, Local not specified - PPO | Attending: Internal Medicine

## 2011-10-01 DIAGNOSIS — M542 Cervicalgia: Secondary | ICD-10-CM | POA: Insufficient documentation

## 2011-10-01 DIAGNOSIS — R5381 Other malaise: Secondary | ICD-10-CM | POA: Insufficient documentation

## 2011-10-01 DIAGNOSIS — IMO0001 Reserved for inherently not codable concepts without codable children: Secondary | ICD-10-CM | POA: Insufficient documentation

## 2011-10-01 DIAGNOSIS — M25519 Pain in unspecified shoulder: Secondary | ICD-10-CM | POA: Insufficient documentation

## 2011-10-02 ENCOUNTER — Other Ambulatory Visit: Payer: Self-pay | Admitting: Internal Medicine

## 2011-10-03 ENCOUNTER — Ambulatory Visit: Payer: Federal, State, Local not specified - PPO

## 2011-10-04 ENCOUNTER — Telehealth: Payer: Self-pay | Admitting: Obstetrics and Gynecology

## 2011-10-04 ENCOUNTER — Encounter: Payer: Self-pay | Admitting: Internal Medicine

## 2011-10-04 NOTE — Telephone Encounter (Signed)
Maham/EP/res

## 2011-10-04 NOTE — Telephone Encounter (Signed)
Ep,   Pt states she mail in her test and want to know her results  lm

## 2011-10-04 NOTE — Telephone Encounter (Signed)
Ep pt 

## 2011-10-05 ENCOUNTER — Telehealth: Payer: Self-pay

## 2011-10-05 ENCOUNTER — Ambulatory Visit (INDEPENDENT_AMBULATORY_CARE_PROVIDER_SITE_OTHER): Payer: Federal, State, Local not specified - PPO | Admitting: Internal Medicine

## 2011-10-05 ENCOUNTER — Other Ambulatory Visit (INDEPENDENT_AMBULATORY_CARE_PROVIDER_SITE_OTHER): Payer: Federal, State, Local not specified - PPO

## 2011-10-05 ENCOUNTER — Other Ambulatory Visit: Payer: Self-pay | Admitting: Internal Medicine

## 2011-10-05 ENCOUNTER — Telehealth: Payer: Self-pay | Admitting: Obstetrics and Gynecology

## 2011-10-05 DIAGNOSIS — E538 Deficiency of other specified B group vitamins: Secondary | ICD-10-CM

## 2011-10-05 MED ORDER — AMBULATORY NON FORMULARY MEDICATION: 100.0000 mg | Freq: Every day | Status: DC

## 2011-10-05 NOTE — Telephone Encounter (Signed)
Message copied by [place], Dondrell Loudermilk E on Fri Oct 05, 2011  3:22 PM ------      Message from: Hart Carwin      Created: Fri Oct 05, 2011  1:31 PM      Regarding: normal B12 level       B12 level now back to normal. OK to stop B12 injections. Recheck B12 in 6 months.      ----- Message -----         From: Nel Stoneking E [place], CMA         Sent: 10/05/2011  11:50 AM           To: Hart Carwin, MD            I gave this pt a B-12 injection today and she would like to know future treatment plans, her B-12 lab test are back.  I will call her back with the instructions you give me.  Thank -you.

## 2011-10-05 NOTE — Telephone Encounter (Signed)
LM to call me back regarding future plans.

## 2011-10-05 NOTE — Telephone Encounter (Signed)
Message copied by [place], Ricarda Atayde E on Fri Oct 05, 2011  2:51 PM ------      Message from: Hart Carwin      Created: Fri Oct 05, 2011  1:31 PM      Regarding: normal B12 level       B12 level now back to normal. OK to stop B12 injections. Recheck B12 in 6 months.      ----- Message -----         From: Caedin Mogan E [place], CMA         Sent: 10/05/2011  11:50 AM           To: Hart Carwin, MD            I gave this pt a B-12 injection today and she would like to know future treatment plans, her B-12 lab test are back.  I will call her back with the instructions you give me.  Thank -you.

## 2011-10-05 NOTE — Telephone Encounter (Signed)
Return call to patient with elevated estradiol (7.5) and normal progesterone (35-for oral supplementation) and low Prog/E2 ratio complaining of early a.m. hot flashes and disruptive sleep.  Explained to patient that imbalance is probably the reason for her symptoms.  She just refilled her current oral Biest 2.5 (60:40)+Progesterone 200 mg therefore for the short term will increase the progesterone until current prescription is completed.  Patient is interested in trying the estradiol patch and will do so after current bottle of meds are completed.  She will take an oral progesterone 200 mg once she begins the patch 0.05 mg.  Patient was agreeable. To contact [facility] to order progesterone. Megan Dionisio,  PA-C

## 2011-10-05 NOTE — Telephone Encounter (Signed)
Christie Nottingham CMA spoke to pt and informed her that she can stop monthly B-12 injections since bloodwork came back normal and repeat B-12 level in 6 months per Dr. [name].  Order put in computer for Vitamin B-12 level to be drawn in six months and pt would like a call reminding her to come.  Staff note sent to Ronny Bacon, CMA to call pt. In six months.

## 2011-10-08 ENCOUNTER — Other Ambulatory Visit: Payer: Self-pay | Admitting: *Deleted

## 2011-10-08 ENCOUNTER — Ambulatory Visit: Payer: Federal, State, Local not specified - PPO

## 2011-10-08 DIAGNOSIS — E538 Deficiency of other specified B group vitamins: Secondary | ICD-10-CM

## 2011-10-11 ENCOUNTER — Ambulatory Visit: Payer: Federal, State, Local not specified - PPO

## 2011-10-15 ENCOUNTER — Telehealth: Payer: Self-pay | Admitting: Obstetrics and Gynecology

## 2011-10-15 ENCOUNTER — Ambulatory Visit: Payer: Federal, State, Local not specified - PPO

## 2011-10-15 NOTE — Telephone Encounter (Signed)
HAS QUESTION ABOUT HORMONES

## 2011-10-15 NOTE — Telephone Encounter (Signed)
Patient recently seen calls Triage nurse to report vaginal spotting.  Patient is planning to test her hormones soon and should be advised to await those results unless her bleeding becomes heavier.  She should keep a calendar of spotting/bleeding and quantify amount of bleeding by the number of pads she has to use daily.  Recommendation sent to Triage to convey to patient. Dejan Angert, PA-C

## 2011-10-15 NOTE — Telephone Encounter (Signed)
Triage/epic/EP pt

## 2011-10-17 ENCOUNTER — Ambulatory Visit: Payer: Federal, State, Local not specified - PPO

## 2011-10-18 ENCOUNTER — Encounter: Payer: Federal, State, Local not specified - PPO | Admitting: Physical Therapy

## 2011-10-18 NOTE — Telephone Encounter (Signed)
Hey EP, this pt wants to know should she continue the old and new medication together bc of the spotting or what do you recommend.  She is taking both, pt also said that she has already discussed the results of the test with you?  Help!

## 2011-10-22 ENCOUNTER — Ambulatory Visit: Payer: Federal, State, Local not specified - PPO | Attending: Internal Medicine

## 2011-10-22 DIAGNOSIS — M25519 Pain in unspecified shoulder: Secondary | ICD-10-CM | POA: Insufficient documentation

## 2011-10-22 DIAGNOSIS — R5381 Other malaise: Secondary | ICD-10-CM | POA: Insufficient documentation

## 2011-10-22 DIAGNOSIS — M542 Cervicalgia: Secondary | ICD-10-CM | POA: Insufficient documentation

## 2011-10-22 DIAGNOSIS — IMO0001 Reserved for inherently not codable concepts without codable children: Secondary | ICD-10-CM | POA: Insufficient documentation

## 2011-10-29 ENCOUNTER — Ambulatory Visit: Payer: Federal, State, Local not specified - PPO

## 2011-10-31 ENCOUNTER — Ambulatory Visit: Payer: Federal, State, Local not specified - PPO

## 2011-11-05 ENCOUNTER — Ambulatory Visit (INDEPENDENT_AMBULATORY_CARE_PROVIDER_SITE_OTHER): Payer: Federal, State, Local not specified - PPO | Admitting: Internal Medicine

## 2011-11-05 DIAGNOSIS — E538 Deficiency of other specified B group vitamins: Secondary | ICD-10-CM

## 2011-11-05 MED ORDER — CYANOCOBALAMIN 1000 MCG/ML IJ SOLN
1000.0000 ug | Freq: Once | INTRAMUSCULAR | Status: AC
  Administered 2011-11-05: 1000 ug via INTRAMUSCULAR

## 2011-11-08 ENCOUNTER — Ambulatory Visit (INDEPENDENT_AMBULATORY_CARE_PROVIDER_SITE_OTHER): Payer: Federal, State, Local not specified - PPO | Admitting: Obstetrics and Gynecology

## 2011-11-08 ENCOUNTER — Encounter: Payer: Self-pay | Admitting: Obstetrics and Gynecology

## 2011-11-08 VITALS — BP 106/70 | HR 76 | Wt 155.0 lb

## 2011-11-08 DIAGNOSIS — N950 Postmenopausal bleeding: Secondary | ICD-10-CM

## 2011-11-08 MED ORDER — FLUCONAZOLE 150 MG PO TABS: 150.0000 mg | ORAL_TABLET | Freq: Once | ORAL | Status: AC

## 2011-11-08 MED ORDER — ESTRADIOL 0.075 MG/24HR TD PTTW: 1.0000 | MEDICATED_PATCH | TRANSDERMAL | Status: DC

## 2011-11-08 MED ORDER — AMBULATORY NON FORMULARY MEDICATION: 200.0000 mg | Freq: Every day | Status: DC

## 2011-11-08 NOTE — Progress Notes (Signed)
Pt here to discuss labs.  ld

## 2011-11-08 NOTE — Progress Notes (Addendum)
66 YO on bio-identical hormones:  Bi-est  (60:40) 2.5 mg + Progesterone 100 mg Capsules bid and Progesterone 100 mg Capsule qhs since June 14th.  Patient's recent hormone (saliva test) showed: estradiol 7.4 (H)  Progesterone  34 (wnl)  &   Pg/E2  5 (L).   Patient reports very good sleep with this regimen. Discussed using transdermal estradiol for better lipid profile, lower dosage, more even levels. and to bypass the liver-patient willing to give it a try in order to lower estradiol level with good symptom control.  Advised patient of yeast on PAP smear, patient admits to vaginal discomfort.  Lastly patient mentioned some spotting for several days with no cramping  A: Hormone Lab Follow up     Yeast on PAP     Spotting  P:  Discontinue Bi-est (60:40) 2.5 mg + Progesterone 100 mb bid & Progesterone 100 mg       Begin Minivelle 0.075 mg as directed twice weekly  (samples given)        Pelvic Ultrasound to measure endometrial stripe.  Reviewed endometrial biopsy. .      Diflucan 150 mg #1 po stat        Copy of ZRT labs to patient        RTO-AEx or prn  Rory Xiang, PA-C

## 2011-11-19 ENCOUNTER — Encounter: Payer: Self-pay | Admitting: Obstetrics and Gynecology

## 2011-11-19 ENCOUNTER — Ambulatory Visit (INDEPENDENT_AMBULATORY_CARE_PROVIDER_SITE_OTHER): Payer: Federal, State, Local not specified - PPO | Admitting: Obstetrics and Gynecology

## 2011-11-19 ENCOUNTER — Ambulatory Visit (INDEPENDENT_AMBULATORY_CARE_PROVIDER_SITE_OTHER): Payer: Federal, State, Local not specified - PPO

## 2011-11-19 VITALS — BP 112/70 | HR 74 | Wt 156.0 lb

## 2011-11-19 DIAGNOSIS — N950 Postmenopausal bleeding: Secondary | ICD-10-CM

## 2011-11-19 DIAGNOSIS — N926 Irregular menstruation, unspecified: Secondary | ICD-10-CM

## 2011-11-19 NOTE — Progress Notes (Signed)
66 YO on BHRT (currently trying Minivelle 0.075 mg)  along with Progesterone 200 mg who was previously on Biest (60:40) 2.5 + Progesterone 100 mg and experienced spotting x 3 days at the end of June and returns now for ultrasound evaluation.  Patient's new regimen of Progesterone 200 mg has caused her daytime sleepiness.  Advised to take earlier in the evening for 1 week to see if there is a difference, if not to take qod and if better will change dose back to 100 mg.   O: U/S- uterus 7.32 x 4.62 x 3.34 cm with endometrium 2.84 mm; left ovary-1.49 x 1.16 x 1.04 cm, right ovary 1.95 x 1.35 x 1.75 cm, no fluid in the cul-de-sac,       normal adnexae  A: H/O Spotting (on BHRT)     Somnolence ? excess Progesterone  P:  Take Progesterone earlier in the day or qod (200 mg)        Patient may need a prescription for Minivelle 0.075 mg if she continues to do well       with the patches       RTO-as scheduled or prn  Margues Filippini, PA-C

## 2011-11-27 ENCOUNTER — Telehealth: Payer: Self-pay | Admitting: Obstetrics and Gynecology

## 2011-11-27 NOTE — Telephone Encounter (Signed)
TC to pt. States since starting the patch [facility]) is having breast tenderness, lower back pain and headaches.  Sx are the same as if getting menses.  Would like to go back to Bio-identical meds and possibly adjust dosage.  EP made aware.

## 2011-11-27 NOTE — Telephone Encounter (Signed)
TRIAGE/EPIC °

## 2011-11-28 ENCOUNTER — Other Ambulatory Visit: Payer: Self-pay | Admitting: Obstetrics and Gynecology

## 2011-11-28 ENCOUNTER — Telehealth: Payer: Self-pay

## 2011-11-28 ENCOUNTER — Other Ambulatory Visit: Payer: Self-pay

## 2011-11-28 ENCOUNTER — Telehealth: Payer: Self-pay | Admitting: Obstetrics and Gynecology

## 2011-11-28 NOTE — Telephone Encounter (Signed)
TC TO PT PHARMACY TO CALL IN THE RX BELOW.

## 2011-11-28 NOTE — Telephone Encounter (Signed)
Message copied by Winfred Leeds on Wed Nov 28, 2011 10:57 AM ------      Message from: Mason Jim      Created: Wed Nov 28, 2011 10:36 AM      Regarding: FW: Prescription Change       Thank you!      ----- Message -----         From: Henreitta Leber, PA         Sent: 11/27/2011   6:26 PM           To: Constance Haw, RN      Subject: Prescription Change                                      Harriett Sine, Patient may resume her oral bio-identical hormones but I'm going to change her estrogen ratio some.  The new prescription is:  Bi-est (70:30) 2.5 mg + Progesterone 100 mg Capsules #60 1 po bid  refills x 1 year.  EP                        ----- Message -----         From: Constance Haw, RN         Sent: 11/27/2011   3:28 PM           To: Henreitta Leber, PA            See telephone note. Thanks

## 2011-11-28 NOTE — Telephone Encounter (Signed)
TC to pt. Informed of EP recommendation. RX to be sent to [facility].  the patient agreeable.

## 2011-11-28 NOTE — Telephone Encounter (Signed)
TC to pt.  Clarified not to take other hormones or use patch,  only new RX.  Pt verbalizes comprehension.

## 2011-11-29 NOTE — Progress Notes (Signed)
Rx completed by L Manns.

## 2011-11-30 MED ORDER — AMBULATORY NON FORMULARY MEDICATION: 100.0000 mg | Freq: Every day | Status: DC

## 2011-11-30 NOTE — Addendum Note (Signed)
Addended byWinfred Leeds on: 11/30/2011 10:46 AM   Modules accepted: Orders

## 2011-11-30 NOTE — Progress Notes (Signed)
Med order complete by L Trilby Drummer

## 2011-12-06 ENCOUNTER — Ambulatory Visit (INDEPENDENT_AMBULATORY_CARE_PROVIDER_SITE_OTHER): Payer: Federal, State, Local not specified - PPO | Admitting: Internal Medicine

## 2011-12-06 DIAGNOSIS — E538 Deficiency of other specified B group vitamins: Secondary | ICD-10-CM

## 2011-12-06 MED ORDER — CYANOCOBALAMIN 1000 MCG/ML IJ SOLN
1000.0000 ug | INTRAMUSCULAR | Status: DC
  Administered 2011-12-06 – 2012-05-13 (×3): 1000 ug via INTRAMUSCULAR

## 2011-12-20 ENCOUNTER — Telehealth: Payer: Self-pay | Admitting: Obstetrics and Gynecology

## 2011-12-20 NOTE — Telephone Encounter (Signed)
EP, PT STATES SHE HAD BLEEDING TODAY AND YESTERDAY;IS THIS NORMAL PLEASE CALL.

## 2011-12-20 NOTE — Telephone Encounter (Signed)
Patient with recent BHRT change from oral to transdermal then back to oral again at the beginning of the month (Biest (60:40) 2.5 mg + Progesterone 100 mg.  Patient called to report light bleeding for past few days. To advise to monitor bleeding as this is probably due to recent change in therapy.  If she has heavy bleeding she should call the office.  Patient's endometrial stripe at the end of July was <  3 mm.

## 2011-12-20 NOTE — Telephone Encounter (Signed)
Keshayla/EP PT

## 2011-12-21 NOTE — Telephone Encounter (Signed)
Tc to pt regading msg and response from EP, lm on vm to call back.

## 2011-12-21 NOTE — Telephone Encounter (Signed)
Pt rtnd call, informed pt of EP's recommendations below, pt voices understanding.

## 2012-01-07 ENCOUNTER — Ambulatory Visit (INDEPENDENT_AMBULATORY_CARE_PROVIDER_SITE_OTHER): Payer: Federal, State, Local not specified - PPO | Admitting: Internal Medicine

## 2012-01-07 DIAGNOSIS — E538 Deficiency of other specified B group vitamins: Secondary | ICD-10-CM

## 2012-01-07 MED ORDER — CYANOCOBALAMIN 1000 MCG/ML IJ SOLN
1000.0000 ug | Freq: Once | INTRAMUSCULAR | Status: AC
  Administered 2012-01-07: 1000 ug via INTRAMUSCULAR

## 2012-01-10 ENCOUNTER — Ambulatory Visit (INDEPENDENT_AMBULATORY_CARE_PROVIDER_SITE_OTHER): Payer: Federal, State, Local not specified - PPO | Admitting: Internal Medicine

## 2012-01-10 ENCOUNTER — Encounter: Payer: Self-pay | Admitting: Internal Medicine

## 2012-01-10 VITALS — BP 110/80 | Temp 97.9°F | Wt 162.0 lb

## 2012-01-10 DIAGNOSIS — M7072 Other bursitis of hip, left hip: Secondary | ICD-10-CM

## 2012-01-10 DIAGNOSIS — M76899 Other specified enthesopathies of unspecified lower limb, excluding foot: Secondary | ICD-10-CM

## 2012-01-10 DIAGNOSIS — K219 Gastro-esophageal reflux disease without esophagitis: Secondary | ICD-10-CM

## 2012-01-10 MED ORDER — PREDNISONE 10 MG PO TABS: ORAL_TABLET | ORAL | Status: DC

## 2012-01-10 MED ORDER — ESOMEPRAZOLE MAGNESIUM 40 MG PO CPDR: 40.0000 mg | DELAYED_RELEASE_CAPSULE | Freq: Every day | ORAL | Status: DC

## 2012-01-10 NOTE — Patient Instructions (Signed)
You  may move around, but avoid painful motions and activities.    Call or return to clinic prn if these symptoms worsen or fail to improve as anticipated.   

## 2012-01-10 NOTE — Progress Notes (Signed)
  Subjective:    Patient ID: Megan Booth, female    DOB: 02/12/1946, 66 y.o.   MRN: [id number]  HPI  66 year old patient who presents with a one-month history of left hip pain. She states that she has had 2 similar prior episodes last occurring about 6 years ago. She was treated by means for orthopedics with a prednisone Dosepak and did quite well. It was felt that she had a hip bursitis. There's been no trauma or any constitutional complaints. She has a history of GERD and does request a refill of Nexium. In general doing quite well. Pain is aggravated by movement.    Review of Systems  Musculoskeletal: Positive for gait problem. Arthralgias: left hip pain.       Objective:   Physical Exam  Constitutional: She appears well-developed and well-nourished. No distress.  Musculoskeletal:       Range of motion of the left hip intact external rotation did tend to reproduce her pain. No localized tenderness straight leg testing normal          Assessment & Plan:   Left hip pain. Probable hip bursitis. This has been refractory to Aleve will treat with a prednisone dose pack She will call if unimproved Nexium refilled

## 2012-01-17 ENCOUNTER — Other Ambulatory Visit: Payer: Self-pay | Admitting: *Deleted

## 2012-01-17 MED ORDER — ZOLPIDEM TARTRATE 10 MG PO TABS: 10.0000 mg | ORAL_TABLET | Freq: Every evening | ORAL | Status: DC | PRN

## 2012-01-25 ENCOUNTER — Ambulatory Visit (INDEPENDENT_AMBULATORY_CARE_PROVIDER_SITE_OTHER): Payer: Federal, State, Local not specified - PPO | Admitting: Internal Medicine

## 2012-01-25 DIAGNOSIS — Z23 Encounter for immunization: Secondary | ICD-10-CM

## 2012-02-06 ENCOUNTER — Ambulatory Visit (INDEPENDENT_AMBULATORY_CARE_PROVIDER_SITE_OTHER): Payer: Federal, State, Local not specified - PPO | Admitting: Internal Medicine

## 2012-02-06 DIAGNOSIS — E538 Deficiency of other specified B group vitamins: Secondary | ICD-10-CM

## 2012-02-06 MED ORDER — CYANOCOBALAMIN 1000 MCG/ML IJ SOLN
1000.0000 ug | INTRAMUSCULAR | Status: DC
  Administered 2012-02-06: 1000 ug via INTRAMUSCULAR

## 2012-02-07 ENCOUNTER — Telehealth: Payer: Self-pay | Admitting: Obstetrics and Gynecology

## 2012-02-07 NOTE — Telephone Encounter (Signed)
TC from pt.  States every month having dark spotting and backache x 5-6 days.   Sched for eval with EP 02/12/12.

## 2012-02-07 NOTE — Telephone Encounter (Signed)
Returned pt's call. LM to return call.   

## 2012-02-11 ENCOUNTER — Ambulatory Visit: Payer: Federal, State, Local not specified - PPO | Admitting: Obstetrics and Gynecology

## 2012-02-12 ENCOUNTER — Ambulatory Visit: Payer: Federal, State, Local not specified - PPO | Admitting: Obstetrics and Gynecology

## 2012-02-12 ENCOUNTER — Ambulatory Visit (INDEPENDENT_AMBULATORY_CARE_PROVIDER_SITE_OTHER): Payer: Federal, State, Local not specified - PPO | Admitting: Obstetrics and Gynecology

## 2012-02-12 ENCOUNTER — Encounter: Payer: Self-pay | Admitting: Obstetrics and Gynecology

## 2012-02-12 VITALS — BP 114/74 | Wt 160.0 lb

## 2012-02-12 DIAGNOSIS — N951 Menopausal and female climacteric states: Secondary | ICD-10-CM

## 2012-02-12 NOTE — Progress Notes (Signed)
66 YO on Biest (70/30) 2.5 mg + Progesterone 100 mg bid  [1.75/0.75]  and  began in 3 weeks after taking this dose began to become emotional, spotting brown and lower back pain (feels like a period that wants to start but can't).  Denies urinary symptoms but admits to erratic BMs but no constipation or diarrhea.   O: Abdomen: soft, non-tender, no CVA tenderness      Pelvic: EGBUS-wnl, vagina-scant beige discharge, cervix-no lesions, uterus/adnexae- no masses or tenderness  A: Irregular Bleeding      Moodiness   P: Advised patient that i would get back with her about changing her hormone dose     Patient appears to have too much estradiol to be offset by her progesterone but will      review her history and get back with her            RTO-as scheduled or prn  Lus Kriegel,  PA-C

## 2012-02-12 NOTE — Progress Notes (Signed)
Menopausal symptoms:moodiness, SPOTTING WITH NEW RX;BIEST SINCETHE RATIO WAS CHANGED  The patient is taking hormone replacement therapy The patient  is not taking a Calcium supplement. The patient participates in regular exercise: yes. Post-menopausal bleeding:no  The patient is not sexually active.  Last Pap: was normal  Last mammogram: was normal Last DEXA scan : WAS NORMAL  History of DVT/PE: No Family history of breast cancer: No Family history of endometrial cancer:No

## 2012-02-13 ENCOUNTER — Telehealth: Payer: Self-pay | Admitting: Obstetrics and Gynecology

## 2012-02-13 MED ORDER — AMBULATORY NON FORMULARY MEDICATION: Status: DC

## 2012-02-13 NOTE — Telephone Encounter (Signed)
Call to patient, left message that her new dose of hormone would be:  Biest (50:50) 2.0 mg + Progesterone 100 mg  # 60 bid 5 refills and that that order will be sent to [facility].  Hridaan Bouse, PA-C

## 2012-03-10 ENCOUNTER — Ambulatory Visit (INDEPENDENT_AMBULATORY_CARE_PROVIDER_SITE_OTHER): Payer: Federal, State, Local not specified - PPO | Admitting: Internal Medicine

## 2012-03-10 DIAGNOSIS — E538 Deficiency of other specified B group vitamins: Secondary | ICD-10-CM

## 2012-04-04 ENCOUNTER — Telehealth: Payer: Self-pay | Admitting: *Deleted

## 2012-04-04 ENCOUNTER — Other Ambulatory Visit (INDEPENDENT_AMBULATORY_CARE_PROVIDER_SITE_OTHER): Payer: Federal, State, Local not specified - PPO

## 2012-04-04 DIAGNOSIS — E538 Deficiency of other specified B group vitamins: Secondary | ICD-10-CM

## 2012-04-04 NOTE — Telephone Encounter (Signed)
Message copied by Daphine Deutscher on Fri Apr 04, 2012  4:25 PM ------      Message from: Daphine Deutscher      Created: Mon Oct 08, 2011 11:28 AM       Call and remind b12 level due on 12/16(DB)

## 2012-04-04 NOTE — Telephone Encounter (Signed)
Patient had B12 level on 04/04/12.

## 2012-04-07 ENCOUNTER — Other Ambulatory Visit: Payer: Self-pay | Admitting: *Deleted

## 2012-04-07 ENCOUNTER — Telehealth: Payer: Self-pay | Admitting: *Deleted

## 2012-04-07 DIAGNOSIS — E538 Deficiency of other specified B group vitamins: Secondary | ICD-10-CM

## 2012-04-07 NOTE — Telephone Encounter (Signed)
Please call pt with normal B12 level, repear 1 year. Continue B12.      Spoke with patient's family and she is out of town. They said to call and leave her a voice mail at her work number. She will be back on Wednesday. Left a voice mail for patient to call me.

## 2012-04-09 ENCOUNTER — Telehealth: Payer: Self-pay

## 2012-04-09 ENCOUNTER — Ambulatory Visit (INDEPENDENT_AMBULATORY_CARE_PROVIDER_SITE_OTHER): Payer: Federal, State, Local not specified - PPO | Admitting: Internal Medicine

## 2012-04-09 ENCOUNTER — Other Ambulatory Visit: Payer: Self-pay | Admitting: *Deleted

## 2012-04-09 DIAGNOSIS — E538 Deficiency of other specified B group vitamins: Secondary | ICD-10-CM

## 2012-04-09 MED ORDER — CYANOCOBALAMIN 1000 MCG/ML IJ SOLN
1000.0000 ug | Freq: Once | INTRAMUSCULAR | Status: AC
  Administered 2012-04-09: 1000 ug via INTRAMUSCULAR

## 2012-04-09 NOTE — Telephone Encounter (Signed)
See note from 04/09/12.

## 2012-04-09 NOTE — Progress Notes (Signed)
Patient informed that B-12 level normal, re-check in a year and continue B-12 shots monthly per Dr. Juanda Chance.

## 2012-04-09 NOTE — Telephone Encounter (Signed)
Patient came in today for her B-12 injection and I informed her of her lab results:  Vit B-12 normal, re-check in a year, continue monthly B12 injections per Dr. Juanda Chance.  Patient verbalized understanding.

## 2012-04-14 ENCOUNTER — Ambulatory Visit (INDEPENDENT_AMBULATORY_CARE_PROVIDER_SITE_OTHER): Payer: Federal, State, Local not specified - PPO | Admitting: Internal Medicine

## 2012-04-14 ENCOUNTER — Encounter: Payer: Self-pay | Admitting: Internal Medicine

## 2012-04-14 ENCOUNTER — Telehealth: Payer: Self-pay | Admitting: Obstetrics and Gynecology

## 2012-04-14 VITALS — BP 120/80 | HR 89 | Temp 97.5°F | Resp 20 | Wt 162.0 lb

## 2012-04-14 DIAGNOSIS — M79631 Pain in right forearm: Secondary | ICD-10-CM

## 2012-04-14 DIAGNOSIS — M79609 Pain in unspecified limb: Secondary | ICD-10-CM

## 2012-04-14 MED ORDER — METHYLPREDNISOLONE ACETATE 80 MG/ML IJ SUSP
80.0000 mg | Freq: Once | INTRAMUSCULAR | Status: AC
  Administered 2012-04-14: 80 mg via INTRAMUSCULAR

## 2012-04-14 NOTE — Patient Instructions (Signed)
You  may move around, but avoid painful motions and activities.  . Use Ace bandage as discussed  Call if unimproved

## 2012-04-14 NOTE — Telephone Encounter (Signed)
Spoke with pt. Was asking if there was any lab work that needed to be done. Advised pt that there was nothing mentioned in last notes or telephone calls regarding lab work.  Pt voiced understanding.  Darien Ramus, CMA

## 2012-04-14 NOTE — Progress Notes (Signed)
Subjective:    Patient ID: Megan Booth, female    DOB: 09-06-1945, 66 y.o.   MRN: [id number]  HPI  66 year old patient who presents with a six-week history of pain involving her proximal right forearm. Pain is aggravated by use of the right lower arm  Past Medical History  Diagnosis Date  . Hyperlipidemia   . GERD (gastroesophageal reflux disease)   . Hypothyroidism   . Diverticulosis   . Internal hemorrhoid   . Barrett esophagus 2003    Dr. Virginia Rochester  . Sleep apnea   . Hx of colonic polyps     Dr. Virginia Rochester   . Leukoplakia, vulva   . Anxiety     History   Social History  . Marital Status: Married    Spouse Name: N/A    Number of Children: 0  . 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 Smoking status: Never Smoker   . Smokeless tobacco: Never Used  . Alcohol Use: 0.6 oz/week    1 Glasses of wine per week  . Drug Use: No  . Sexually Active: No     Comment: BTL   Other Topics Concern  . Not on file   Social History Narrative   0 caffeine drinks     Past Surgical History  Procedure Date  . Lasik   . Breast enhancement surgery   . Dilation and curettage of uterus     x 2  . Hysteroscopy   . Tubal ligation     Family History  Problem Relation Age of Onset  . Heart disease Mother     pacemaker  . Hypertension Mother   . Depression Mother   . Diabetes Mother   . Dementia Mother   . Seizures Brother   . Lung cancer Father   . Cancer Father 60    lung  . Colon cancer Neg Hx     No Known Allergies  Current Outpatient Prescriptions on File Prior to Visit  Medication Sig Dispense Refill  . AMBULATORY NON FORMULARY MEDICATION Take 200 mg by mouth daily. Medication Name: Progesterone 200mg    1 po qhs  30 capsule  11  . AMBULATORY NON FORMULARY MEDICATION Take 100 mg by mouth at bedtime. Medication Name: Progesterone Capsule 100 mg  30 capsule  1  . AMBULATORY NON FORMULARY MEDICATION Medication  Name: Biest (50:50) 2.0 mg + Progesterone 100 mg Capsule    1 po bid  60 capsule  5  . atorvastatin (LIPITOR) 40 MG tablet TAKE 1 TABLET DAILY  90 tablet  3  . cyanocobalamin 100 MCG tablet Take 100 mcg by mouth daily.      Marland Kitchen esomeprazole (NEXIUM) 40 MG capsule Take 1 capsule (40 mg total) by mouth daily before breakfast.  90 capsule  3  . ibuprofen (ADVIL,MOTRIN) 200 MG tablet Take 400 mg by mouth as needed.        Marland Kitchen LASIX 20 MG tablet TAKE (1) TABLET DAILY AS NEEDED.  30 each  5  . Naproxen Sodium (ALEVE) 220 MG CAPS Take 2 capsules by mouth as needed.        . NON FORMULARY BIEST 70/30  HAS PROGESTERONE IN IT      . nystatin-triamcinolone ointment (MYCOLOG) Apply topically 3 (three) times daily as needed.  60 g  11  . phentermine (ADIPEX-P) 37.5 MG tablet       . predniSONE (DELTASONE)  10 MG tablet 3 tablets twice daily for 3 days, then 2 tablets twice daily for 3 days, then one tablet twice daily for 3 days, then 1 tablet daily  40 tablet  0  . zolpidem (AMBIEN) 10 MG tablet Take 1 tablet (10 mg total) by mouth at bedtime as needed for sleep.  30 tablet  3   Current Facility-Administered Medications on File Prior to Visit  Medication Dose Route Frequency Provider Last Rate Last Dose  . cyanocobalamin ((VITAMIN B-12)) injection 1,000 mcg  1,000 mcg Intramuscular Q30 days Hart Carwin, MD   1,000 mcg at 03/10/12 0823    BP 120/80  Pulse 89  Temp 97.5 F (36.4 C) (Oral)  Resp 20  Wt 162 lb (73.483 kg)  SpO2 98%       Review of Systems  Musculoskeletal: Positive for myalgias.       Objective:   Physical Exam  Constitutional: She appears well-developed and well-nourished. No distress.  Musculoskeletal:       Tenderness and a suggestion of soft tissue swelling over the right proximal anterior forearm;  pain is aggravated in this location by hand grip and especially pronation against resistance of the lower arm; no tenderness over the lateral elbow area           Assessment & Plan:   Right lower arm tendinitis. We'll treat with Depo-Medrol and an Ace bandage. Will attempt to avoid overuse. Will call if unimproved

## 2012-05-13 ENCOUNTER — Ambulatory Visit (INDEPENDENT_AMBULATORY_CARE_PROVIDER_SITE_OTHER): Payer: Federal, State, Local not specified - PPO | Admitting: Internal Medicine

## 2012-05-13 DIAGNOSIS — E538 Deficiency of other specified B group vitamins: Secondary | ICD-10-CM

## 2012-06-13 ENCOUNTER — Ambulatory Visit (INDEPENDENT_AMBULATORY_CARE_PROVIDER_SITE_OTHER): Payer: Federal, State, Local not specified - PPO | Admitting: Internal Medicine

## 2012-06-13 DIAGNOSIS — E538 Deficiency of other specified B group vitamins: Secondary | ICD-10-CM

## 2012-06-13 MED ORDER — CYANOCOBALAMIN 1000 MCG/ML IJ SOLN
1000.0000 ug | Freq: Once | INTRAMUSCULAR | Status: AC
  Administered 2012-06-13: 1000 ug via INTRAMUSCULAR

## 2012-06-19 ENCOUNTER — Other Ambulatory Visit: Payer: Self-pay | Admitting: *Deleted

## 2012-06-19 MED ORDER — ESOMEPRAZOLE MAGNESIUM 40 MG PO CPDR: 40.0000 mg | DELAYED_RELEASE_CAPSULE | Freq: Every day | ORAL | Status: DC

## 2012-07-18 ENCOUNTER — Ambulatory Visit (INDEPENDENT_AMBULATORY_CARE_PROVIDER_SITE_OTHER): Payer: Federal, State, Local not specified - PPO

## 2012-07-18 DIAGNOSIS — E538 Deficiency of other specified B group vitamins: Secondary | ICD-10-CM

## 2012-07-18 MED ORDER — CYANOCOBALAMIN 1000 MCG/ML IJ SOLN
1000.0000 ug | Freq: Once | INTRAMUSCULAR | Status: AC
  Administered 2012-07-18: 1000 ug via INTRAMUSCULAR

## 2012-09-12 ENCOUNTER — Other Ambulatory Visit (INDEPENDENT_AMBULATORY_CARE_PROVIDER_SITE_OTHER): Payer: Federal, State, Local not specified - PPO

## 2012-09-12 DIAGNOSIS — Z0000 Encounter for general adult medical examination without abnormal findings: Secondary | ICD-10-CM

## 2012-09-12 LAB — HEPATIC FUNCTION PANEL
ALT: 20 U/L (ref 0–35)
AST: 19 U/L (ref 0–37)
Alkaline Phosphatase: 42 U/L (ref 39–117)
Bilirubin, Direct: 0 mg/dL (ref 0.0–0.3)
Total Bilirubin: 0.8 mg/dL (ref 0.3–1.2)
Total Protein: 6.4 g/dL (ref 6.0–8.3)

## 2012-09-12 LAB — CBC WITH DIFFERENTIAL/PLATELET
Basophils Relative: 0.6 % (ref 0.0–3.0)
Eosinophils Relative: 2.3 % (ref 0.0–5.0)
Lymphocytes Relative: 48.9 % — ABNORMAL HIGH (ref 12.0–46.0)
MCV: 92.5 fl (ref 78.0–100.0)
Monocytes Relative: 7.3 % (ref 3.0–12.0)
Neutrophils Relative %: 40.9 % — ABNORMAL LOW (ref 43.0–77.0)
Platelets: 260 10*3/uL (ref 150.0–400.0)
RBC: 4.4 Mil/uL (ref 3.87–5.11)
WBC: 5.6 10*3/uL (ref 4.5–10.5)

## 2012-09-12 LAB — POCT URINALYSIS DIPSTICK
Blood, UA: NEGATIVE
Leukocytes, UA: NEGATIVE
Protein, UA: NEGATIVE
Spec Grav, UA: >=1.03
pH, UA: 6

## 2012-09-12 LAB — LIPID PANEL
HDL: 46.6 mg/dL (ref >39.00)
LDL Cholesterol: 64 mg/dL (ref 0–99)
VLDL: 24.8 mg/dL (ref 0.0–40.0)

## 2012-09-12 LAB — BASIC METABOLIC PANEL
BUN: 18 mg/dL (ref 6–23)
Chloride: 108 mEq/L (ref 96–112)
Creatinine, Ser: 1.1 mg/dL (ref 0.4–1.2)
GFR: 52.45 mL/min — ABNORMAL LOW (ref >60.00)

## 2012-09-19 ENCOUNTER — Encounter: Payer: Self-pay | Admitting: Family

## 2012-09-19 ENCOUNTER — Encounter: Payer: Federal, State, Local not specified - PPO | Admitting: Internal Medicine

## 2012-09-19 ENCOUNTER — Ambulatory Visit (INDEPENDENT_AMBULATORY_CARE_PROVIDER_SITE_OTHER): Payer: Federal, State, Local not specified - PPO | Admitting: Family

## 2012-09-19 VITALS — BP 98/60 | HR 71 | Ht 63.0 in | Wt 153.0 lb

## 2012-09-19 DIAGNOSIS — Z0000 Encounter for general adult medical examination without abnormal findings: Secondary | ICD-10-CM

## 2012-09-19 DIAGNOSIS — E7800 Pure hypercholesterolemia, unspecified: Secondary | ICD-10-CM

## 2012-09-19 MED ORDER — ZOLPIDEM TARTRATE 10 MG PO TABS: 10.0000 mg | ORAL_TABLET | Freq: Every evening | ORAL | Status: DC | PRN

## 2012-09-19 MED ORDER — FUROSEMIDE 20 MG PO TABS: ORAL_TABLET | ORAL | Status: DC

## 2012-09-19 MED ORDER — ESOMEPRAZOLE MAGNESIUM 40 MG PO CPDR: 40.0000 mg | DELAYED_RELEASE_CAPSULE | Freq: Every day | ORAL | Status: DC

## 2012-09-19 MED ORDER — ATORVASTATIN CALCIUM 40 MG PO TABS: ORAL_TABLET | ORAL | Status: DC

## 2012-09-19 NOTE — Progress Notes (Signed)
Subjective:    Patient ID: Megan Booth, female    DOB: 06/18/1945, 67 y.o.   MRN: [id number]  HPI  This is a routine physical examination for this healthy  Female. Reviewed all health maintenance protocols including mammography colonoscopy bone density and reviewed appropriate screening labs. Her immunization history was reviewed as well as her current medications and allergies refills of her chronic medications were given and the plan for yearly health maintenance was discussed all orders and referrals were made as appropriate.   Review of Systems  Constitutional: Negative.   HENT: Negative.   Eyes: Negative.   Respiratory: Negative.   Cardiovascular: Negative.   Gastrointestinal: Negative.   Endocrine: Negative.   Genitourinary: Positive for dysuria and vaginal bleeding. Negative for frequency, hematuria, flank pain, vaginal discharge, enuresis and vaginal pain.       Reports vaginal spotting ongoing x 1 month intermittently. Pink discharge.  Musculoskeletal: Negative.   Skin: Negative.   Allergic/Immunologic: Negative.   Neurological: Negative.   Hematological: Negative.   Psychiatric/Behavioral: Negative.    Past Medical History  Diagnosis Date  . Hyperlipidemia   . GERD (gastroesophageal reflux disease)   . Hypothyroidism   . Diverticulosis   . Internal hemorrhoid   . Barrett esophagus 2003    Dr. Virginia Rochester  . Sleep apnea   . Hx of colonic polyps     Dr. Virginia Rochester   . Leukoplakia, vulva   . Anxiety     History   Social History  . Marital Status: Married    Spouse Name: N/A    Number of Children: 0  . 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 Smoking status: Never Smoker   . Smokeless tobacco: Never Used  . Alcohol Use: 0.6 oz/week    1 Glasses of wine per week  . Drug Use: No  . Sexually Active: No     Comment: BTL   Other Topics Concern  . Not on file   Social History Narrative   0 caffeine  drinks     Past Surgical History  Procedure Laterality Date  . Lasik    . Breast enhancement surgery    . Dilation and curettage of uterus      x 2  . Hysteroscopy    . Tubal ligation      Family History  Problem Relation Age of Onset  . Heart disease Mother     pacemaker  . Hypertension Mother   . Depression Mother   . Diabetes Mother   . Dementia Mother   . Seizures Brother   . Lung cancer Father   . Cancer Father 60    lung  . Colon cancer Neg Hx     No Known Allergies  Current Outpatient Prescriptions on File Prior to Visit  Medication Sig Dispense Refill  . AMBULATORY NON FORMULARY MEDICATION Take 200 mg by mouth daily. Medication Name: Progesterone 200mg    1 po qhs  30 capsule  11  . AMBULATORY NON FORMULARY MEDICATION Take 100 mg by mouth at bedtime. Medication Name: Progesterone Capsule 100 mg  30 capsule  1  . AMBULATORY NON FORMULARY MEDICATION Medication Name: Biest (50:50) 2.0 mg + Progesterone 100 mg Capsule    1 po bid  60 capsule  5  . ibuprofen (ADVIL,MOTRIN) 200 MG tablet Take 400 mg by mouth as needed.        . Naproxen Sodium (  ALEVE) 220 MG CAPS Take 2 capsules by mouth as needed.        . Progesterone Micronized (PROGESTERONE, BULK,) POWD       . cyanocobalamin 100 MCG tablet Take 100 mcg by mouth daily.      . NON FORMULARY BIEST 70/30  HAS PROGESTERONE IN IT      . phentermine (ADIPEX-P) 37.5 MG tablet       . predniSONE (DELTASONE) 10 MG tablet 3 tablets twice daily for 3 days, then 2 tablets twice daily for 3 days, then one tablet twice daily for 3 days, then 1 tablet daily  40 tablet  0   No current facility-administered medications on file prior to visit.    BP 98/60  Pulse 71  Ht 5\' 3"  (1.6 m)  Wt 153 lb (69.4 kg)  BMI 27.11 kg/m2  SpO2 99%chart    Objective:   Physical Exam  Constitutional: She is oriented to person, place, and time. She appears well-developed and well-nourished.  HENT:  Head: Normocephalic.  Right Ear: External  ear normal.  Left Ear: External ear normal.  Nose: Nose normal.  Mouth/Throat: Oropharynx is clear and moist.  Eyes: Conjunctivae and EOM are normal. Pupils are equal, round, and reactive to light.  Neck: Normal range of motion. Neck supple. No thyromegaly present.  Cardiovascular: Normal rate, regular rhythm and normal heart sounds.   Pulmonary/Chest: Effort normal and breath sounds normal.  Abdominal: Soft. Bowel sounds are normal.  Genitourinary:  Deferred to GYN at patient request  Musculoskeletal: Normal range of motion. She exhibits no edema.  Neurological: She is alert and oriented to person, place, and time. She has normal reflexes. No cranial nerve deficit. Coordination normal.  Skin: Skin is warm and dry.  Psychiatric: She has a normal mood and affect.          Assessment & Plan:  Assessment:  1. Complete physical exam 2. Hyperlipidemia 3. Hypothyroidism  Plan: Continue current medications. Followup with gynecology in July as discussed. Mammogram scheduled. Encouraged exercise 30 minutes most days of the week. We'll follow with patient in 6 months and sooner as needed.

## 2012-09-19 NOTE — Patient Instructions (Addendum)

## 2012-10-08 ENCOUNTER — Telehealth: Payer: Self-pay | Admitting: Internal Medicine

## 2012-10-08 DIAGNOSIS — E538 Deficiency of other specified B group vitamins: Secondary | ICD-10-CM

## 2012-10-08 NOTE — Telephone Encounter (Signed)
Patient is asking if she needs to continue B12 injections with her PCP. She is on a waiting list to get it. Has not had it since March. She does not want nasal spray. Please, advise.

## 2012-10-08 NOTE — Telephone Encounter (Signed)
Left a message for patient to call me. 

## 2012-10-08 NOTE — Telephone Encounter (Signed)
Spoke with patient and gave her Dr. Regino Schultze recommendation. Lab in [facility] with patient reminder.

## 2012-10-08 NOTE — Telephone Encounter (Signed)
May stop  B12 shots, last B12 level 03/2012 was 660ug, she should have it rechecked again in Dec 2014.

## 2013-02-26 ENCOUNTER — Other Ambulatory Visit: Payer: Self-pay

## 2013-03-24 ENCOUNTER — Telehealth: Payer: Self-pay | Admitting: *Deleted

## 2013-03-24 NOTE — Telephone Encounter (Signed)
Left a message for patient to call me. 

## 2013-03-24 NOTE — Telephone Encounter (Signed)
Message copied by Daphine Deutscher on Tue Mar 24, 2013  1:54 PM ------      Message from: Daphine Deutscher      Created: Mon Apr 07, 2012  9:50 AM       Call and remind B12 level for DB on 03/23/13. ------

## 2013-03-24 NOTE — Telephone Encounter (Signed)
Message copied by Daphine Deutscher on Tue Mar 24, 2013  9:14 AM ------      Message from: Daphine Deutscher      Created: Wed Oct 08, 2012  1:50 PM       Call and remind patient due for B12 level for DB in Dec. ------

## 2013-03-25 ENCOUNTER — Other Ambulatory Visit (INDEPENDENT_AMBULATORY_CARE_PROVIDER_SITE_OTHER): Payer: Federal, State, Local not specified - PPO

## 2013-03-25 DIAGNOSIS — E538 Deficiency of other specified B group vitamins: Secondary | ICD-10-CM

## 2013-03-25 LAB — VITAMIN B12: Vitamin B-12: 310 pg/mL (ref 211–911)

## 2013-03-25 NOTE — Telephone Encounter (Signed)
Left a message for patient to call me. 

## 2013-03-26 ENCOUNTER — Ambulatory Visit (INDEPENDENT_AMBULATORY_CARE_PROVIDER_SITE_OTHER): Payer: Federal, State, Local not specified - PPO | Admitting: *Deleted

## 2013-03-26 ENCOUNTER — Other Ambulatory Visit: Payer: Self-pay | Admitting: *Deleted

## 2013-03-26 DIAGNOSIS — D519 Vitamin B12 deficiency anemia, unspecified: Secondary | ICD-10-CM

## 2013-03-26 DIAGNOSIS — D518 Other vitamin B12 deficiency anemias: Secondary | ICD-10-CM

## 2013-03-26 DIAGNOSIS — E538 Deficiency of other specified B group vitamins: Secondary | ICD-10-CM

## 2013-03-26 MED ORDER — CYANOCOBALAMIN 1000 MCG PO CAPS: ORAL_CAPSULE | ORAL | Status: DC

## 2013-03-26 MED ORDER — CYANOCOBALAMIN 1000 MCG/ML IJ SOLN
1000.0000 ug | INTRAMUSCULAR | Status: AC
  Administered 2013-03-26: 1000 ug via INTRAMUSCULAR

## 2013-04-23 DIAGNOSIS — G454 Transient global amnesia: Secondary | ICD-10-CM

## 2013-04-23 HISTORY — DX: Transient global amnesia: G45.4

## 2013-04-29 ENCOUNTER — Ambulatory Visit (INDEPENDENT_AMBULATORY_CARE_PROVIDER_SITE_OTHER): Payer: Federal, State, Local not specified - PPO | Admitting: *Deleted

## 2013-04-29 DIAGNOSIS — E538 Deficiency of other specified B group vitamins: Secondary | ICD-10-CM

## 2013-04-30 DIAGNOSIS — E538 Deficiency of other specified B group vitamins: Secondary | ICD-10-CM

## 2013-04-30 MED ORDER — CYANOCOBALAMIN 1000 MCG/ML IJ SOLN
1000.0000 ug | Freq: Once | INTRAMUSCULAR | Status: AC
  Administered 2013-04-30: 1000 ug via INTRAMUSCULAR

## 2013-05-06 ENCOUNTER — Observation Stay (HOSPITAL_COMMUNITY)
Admission: EM | Admit: 2013-05-06 | Discharge: 2013-05-07 | Disposition: A | Discharge status: Home / Self Care | Payer: Federal, State, Local not specified - PPO | Attending: Internal Medicine | Admitting: Internal Medicine

## 2013-05-06 ENCOUNTER — Emergency Department (HOSPITAL_COMMUNITY): Payer: Federal, State, Local not specified - PPO

## 2013-05-06 ENCOUNTER — Encounter (HOSPITAL_COMMUNITY): Payer: Self-pay | Admitting: Emergency Medicine

## 2013-05-06 ENCOUNTER — Inpatient Hospital Stay (HOSPITAL_COMMUNITY): Payer: Federal, State, Local not specified - PPO

## 2013-05-06 DIAGNOSIS — E039 Hypothyroidism, unspecified: Secondary | ICD-10-CM | POA: Diagnosis not present

## 2013-05-06 DIAGNOSIS — E785 Hyperlipidemia, unspecified: Secondary | ICD-10-CM | POA: Diagnosis present

## 2013-05-06 DIAGNOSIS — F4489 Other dissociative and conversion disorders: Secondary | ICD-10-CM | POA: Diagnosis not present

## 2013-05-06 DIAGNOSIS — R413 Other amnesia: Secondary | ICD-10-CM | POA: Diagnosis present

## 2013-05-06 DIAGNOSIS — K219 Gastro-esophageal reflux disease without esophagitis: Secondary | ICD-10-CM | POA: Diagnosis not present

## 2013-05-06 DIAGNOSIS — E538 Deficiency of other specified B group vitamins: Secondary | ICD-10-CM | POA: Diagnosis not present

## 2013-05-06 DIAGNOSIS — G454 Transient global amnesia: Principal | ICD-10-CM | POA: Diagnosis present

## 2013-05-06 DIAGNOSIS — R6889 Other general symptoms and signs: Secondary | ICD-10-CM | POA: Diagnosis not present

## 2013-05-06 DIAGNOSIS — G4730 Sleep apnea, unspecified: Secondary | ICD-10-CM | POA: Insufficient documentation

## 2013-05-06 DIAGNOSIS — R4182 Altered mental status, unspecified: Secondary | ICD-10-CM | POA: Diagnosis not present

## 2013-05-06 DIAGNOSIS — F411 Generalized anxiety disorder: Secondary | ICD-10-CM | POA: Insufficient documentation

## 2013-05-06 LAB — COMPREHENSIVE METABOLIC PANEL WITH GFR
ALT: 20 U/L (ref 0–35)
AST: 15 U/L (ref 0–37)
Albumin: 4.1 g/dL (ref 3.5–5.2)
Alkaline Phosphatase: 72 U/L (ref 39–117)
BUN: 19 mg/dL (ref 6–23)
CO2: 23 meq/L (ref 19–32)
Calcium: 9.5 mg/dL (ref 8.4–10.5)
Chloride: 104 meq/L (ref 96–112)
Creatinine, Ser: 1.08 mg/dL (ref 0.50–1.10)
GFR calc Af Amer: 61 mL/min — ABNORMAL LOW
GFR calc non Af Amer: 53 mL/min — ABNORMAL LOW
Glucose, Bld: 113 mg/dL — ABNORMAL HIGH (ref 70–99)
Potassium: 4.1 meq/L (ref 3.7–5.3)
Sodium: 140 meq/L (ref 137–147)
Total Bilirubin: 0.3 mg/dL (ref 0.3–1.2)
Total Protein: 7.1 g/dL (ref 6.0–8.3)

## 2013-05-06 LAB — RAPID URINE DRUG SCREEN, HOSP PERFORMED
Amphetamines: NOT DETECTED
BARBITURATES: NOT DETECTED
BENZODIAZEPINES: NOT DETECTED
COCAINE: NOT DETECTED
Opiates: NOT DETECTED
Tetrahydrocannabinol: NOT DETECTED

## 2013-05-06 LAB — CBC WITH DIFFERENTIAL/PLATELET
BASOS PCT: 0 % (ref 0–1)
Basophils Absolute: 0 10*3/uL (ref 0.0–0.1)
EOS ABS: 0.1 10*3/uL (ref 0.0–0.7)
EOS PCT: 1 % (ref 0–5)
HCT: 39.2 % (ref 36.0–46.0)
Hemoglobin: 13.7 g/dL (ref 12.0–15.0)
LYMPHS ABS: 2.4 10*3/uL (ref 0.7–4.0)
Lymphocytes Relative: 32 % (ref 12–46)
MCH: 31.6 pg (ref 26.0–34.0)
MCHC: 34.9 g/dL (ref 30.0–36.0)
MCV: 90.5 fL (ref 78.0–100.0)
Monocytes Absolute: 0.5 10*3/uL (ref 0.1–1.0)
Monocytes Relative: 7 % (ref 3–12)
NEUTROS PCT: 60 % (ref 43–77)
Neutro Abs: 4.4 10*3/uL (ref 1.7–7.7)
PLATELETS: 277 10*3/uL (ref 150–400)
RBC: 4.33 MIL/uL (ref 3.87–5.11)
RDW: 13 % (ref 11.5–15.5)
WBC: 7.4 10*3/uL (ref 4.0–10.5)

## 2013-05-06 LAB — URINALYSIS, ROUTINE W REFLEX MICROSCOPIC
Bilirubin Urine: NEGATIVE
GLUCOSE, UA: NEGATIVE mg/dL
Hgb urine dipstick: NEGATIVE
Ketones, ur: NEGATIVE mg/dL
Leukocytes, UA: NEGATIVE
Nitrite: NEGATIVE
PROTEIN: NEGATIVE mg/dL
Specific Gravity, Urine: 1.013 (ref 1.005–1.030)
Urobilinogen, UA: 0.2 mg/dL (ref 0.0–1.0)
pH: 5 (ref 5.0–8.0)

## 2013-05-06 LAB — ETHANOL: Alcohol, Ethyl (B): <11 mg/dL (ref 0–11)

## 2013-05-06 MED ORDER — ONDANSETRON HCL 4 MG/2ML IJ SOLN: 4.0000 mg | Freq: Four times a day (QID) | INTRAMUSCULAR | Status: DC | PRN

## 2013-05-06 MED ORDER — ACETAMINOPHEN 325 MG PO TABS: 650.0000 mg | ORAL_TABLET | Freq: Four times a day (QID) | ORAL | Status: DC | PRN

## 2013-05-06 MED ORDER — SODIUM CHLORIDE 0.9 % IV SOLN
INTRAVENOUS | Status: DC
  Administered 2013-05-06: 21:00:00 via INTRAVENOUS

## 2013-05-06 MED ORDER — SODIUM CHLORIDE 0.9 % IJ SOLN: 3.0000 mL | Freq: Two times a day (BID) | INTRAMUSCULAR | Status: DC

## 2013-05-06 MED ORDER — GADOBENATE DIMEGLUMINE 529 MG/ML IV SOLN
15.0000 mL | Freq: Once | INTRAVENOUS | Status: AC
  Administered 2013-05-06: 14 mL via INTRAVENOUS

## 2013-05-06 MED ORDER — ASPIRIN EC 81 MG PO TBEC
81.0000 mg | DELAYED_RELEASE_TABLET | Freq: Every day | ORAL | Status: DC
  Administered 2013-05-06 – 2013-05-07 (×2): 81 mg via ORAL
  Filled 2013-05-06 (×2): qty 1

## 2013-05-06 MED ORDER — ATORVASTATIN CALCIUM 40 MG PO TABS
40.0000 mg | ORAL_TABLET | Freq: Every morning | ORAL | Status: DC
  Administered 2013-05-07: 40 mg via ORAL
  Filled 2013-05-06: qty 1

## 2013-05-06 MED ORDER — ACETAMINOPHEN 650 MG RE SUPP: 650.0000 mg | Freq: Four times a day (QID) | RECTAL | Status: DC | PRN

## 2013-05-06 MED ORDER — SODIUM CHLORIDE 0.9 % IV SOLN
INTRAVENOUS | Status: DC
  Administered 2013-05-06: 15:00:00 via INTRAVENOUS

## 2013-05-06 MED ORDER — ENOXAPARIN SODIUM 40 MG/0.4ML ~~LOC~~ SOLN
40.0000 mg | SUBCUTANEOUS | Status: DC
  Administered 2013-05-06: 40 mg via SUBCUTANEOUS
  Filled 2013-05-06 (×2): qty 0.4

## 2013-05-06 MED ORDER — ALUM & MAG HYDROXIDE-SIMETH 200-200-20 MG/5ML PO SUSP: 30.0000 mL | Freq: Four times a day (QID) | ORAL | Status: DC | PRN

## 2013-05-06 MED ORDER — ONDANSETRON HCL 4 MG PO TABS: 4.0000 mg | ORAL_TABLET | Freq: Four times a day (QID) | ORAL | Status: DC | PRN

## 2013-05-06 MED ORDER — TOPIRAMATE 25 MG PO TABS
25.0000 mg | ORAL_TABLET | Freq: Every day | ORAL | Status: DC
  Administered 2013-05-07: 25 mg via ORAL
  Filled 2013-05-06: qty 1

## 2013-05-06 NOTE — H&P (Signed)
[facility] History and Physical  Megan Booth IFO:[id number] DOB: [birth year] DOA: 05/06/2013  Referring physician:  PCP: Chancy Hurter, MD   Chief Complaint: Amnesia  HPI: Megan Booth is a 68 y.o. female with a past medical history of dyslipidemia, hypothyroidism, who was in her usual state health until approximately 10 AM her husband noted her to be acting differently. She asked him multiple times what day of the week it was which was unusual for her. He did not note slurred speech, facial droop, unilateral weakness, falls or seizure-like activity. She was able to recognize family members, remember her birthday, as well as other important events. It appeared that she could remember long-term events however had difficulties recalling short-term events. While in the emergency department she asked her husband multiple times while she was here. She has not had recent head trauma, nor has there been reports of headache, nausea, vomiting, blurry vision, fevers, chills, cough, sputum production or shortness of breath.  A CT scan of brain without contrast performed in the emergency department showed no acute intracranial abnormality. Patient was seen and evaluated by neurology who recommended obtaining an MRI of brain, EEG, overnight stay with telemetry.                                                                Review of Systems:  Constitutional:  No weight loss, night sweats, Fevers, chills, fatigue.  HEENT:  No headaches, Difficulty swallowing,Tooth/dental problems,Sore throat,  No sneezing, itching, ear ache, nasal congestion, post nasal drip,  Cardio-vascular:  No chest pain, Orthopnea, PND, swelling in lower extremities, anasarca, dizziness, palpitations  GI:  No heartburn, indigestion, abdominal pain, nausea, vomiting, diarrhea, change in bowel habits, loss of appetite  Resp:  No shortness of breath with exertion or at rest. No excess mucus, no productive cough, No  non-productive cough, No coughing up of blood.No change in color of mucus.No wheezing.No chest wall deformity  Skin:  no rash or lesions.  GU:  no dysuria, change in color of urine, no urgency or frequency. No flank pain.  Musculoskeletal:  No joint pain or swelling. No decreased range of motion. No back pain.  Psych:  No change in mood or affect. No depression or anxiety. Positive for short-term memory loss  Past Medical History  Diagnosis Date  . Hyperlipidemia   . GERD (gastroesophageal reflux disease)   . Hypothyroidism   . Diverticulosis   . Internal hemorrhoid   . Barrett esophagus 2003    Dr. Lajoyce Corners  . Sleep apnea   . Hx of colonic polyps     Dr. Lajoyce Corners   . Leukoplakia, vulva   . Anxiety    Past Surgical History  Procedure Laterality Date  . Lasik    . Breast enhancement surgery    . Dilation and curettage of uterus      x 2  . Hysteroscopy    . Tubal ligation     Social History:  reports that she has never smoked. She has never used smokeless tobacco. She reports that she drinks about 0.6 ounces of alcohol per week. She reports that she does not use illicit drugs.  No Known Allergies  Family History  Problem Relation Age of Onset  . Heart disease Mother  pacemaker  . Hypertension Mother   . Depression Mother   . Diabetes Mother   . Dementia Mother   . Seizures Brother   . Lung cancer Father   . Cancer Father 52    lung  . Colon cancer Neg Hx      Prior to Admission medications   Medication Sig Start Date End Date Taking? Authorizing Provider  atorvastatin (LIPITOR) 40 MG tablet Take 40 mg by mouth daily at 6 PM.   Yes Historical Provider, MD  esomeprazole (NEXIUM) 40 MG capsule Take 1 capsule (40 mg total) by mouth daily before breakfast. 09/19/12  Yes Timoteo Gaul, FNP  furosemide (LASIX) 20 MG tablet Take 20 mg by mouth daily as needed for fluid or edema.   Yes Historical Provider, MD  NONFORMULARY OR COMPOUNDED ITEM Take 1 capsule by mouth daily.  "Progesterone, Estradiol, and Estriol" capsule   Yes Historical Provider, MD  zolpidem (AMBIEN) 10 MG tablet Take 1 tablet (10 mg total) by mouth at bedtime as needed for sleep. 09/19/12  Yes Timoteo Gaul, FNP  pneumococcal 13-valent conjugate vaccine (PREVNAR 13) SUSP injection Inject 0.5 mLs into the muscle once.    Historical Provider, MD   Physical Exam: Filed Vitals:   05/06/13 1530  BP: 132/81  Pulse: 87  Temp:   Resp: 13    BP 132/81  Pulse 87  Temp(Src) 97.7 F (36.5 C) (Oral)  Resp 13  SpO2 97%  General:  Appears calm and comfortable Eyes: PERRL, normal lids, irises & conjunctiva ENT: grossly normal hearing, lips & tongue Neck: no LAD, masses or thyromegaly Cardiovascular: RRR, no m/r/g. No LE edema. Telemetry: SR, no arrhythmias  Respiratory: CTA bilaterally, no w/r/r. Normal respiratory effort. Abdomen: soft, ntnd Skin: no rash or induration seen on limited exam Musculoskeletal: grossly normal tone BUE/BLE Psychiatric: grossly normal mood and affect, speech fluent and appropriate Neurologic: grossly non-focal.          Labs on Admission:  Basic Metabolic Panel:  Recent Labs Lab 05/06/13 1500  NA 140  K 4.1  CL 104  CO2 23  GLUCOSE 113*  BUN 19  CREATININE 1.08  CALCIUM 9.5   Liver Function Tests:  Recent Labs Lab 05/06/13 1500  AST 15  ALT 20  ALKPHOS 72  BILITOT 0.3  PROT 7.1  ALBUMIN 4.1   No results found for this basename: LIPASE, AMYLASE,  in the last 168 hours No results found for this basename: AMMONIA,  in the last 168 hours CBC:  Recent Labs Lab 05/06/13 1500  WBC 7.4  NEUTROABS 4.4  HGB 13.7  HCT 39.2  MCV 90.5  PLT 277   Cardiac Enzymes: No results found for this basename: CKTOTAL, CKMB, CKMBINDEX, TROPONINI,  in the last 168 hours  BNP (last 3 results) No results found for this basename: PROBNP,  in the last 8760 hours CBG: No results found for this basename: GLUCAP,  in the last 168 hours  Radiological  Exams on Admission: Ct Head Wo Contrast  05/06/2013   CLINICAL DATA:  Mental status change  EXAM: CT HEAD WITHOUT CONTRAST  TECHNIQUE: Contiguous axial images were obtained from the base of the skull through the vertex without intravenous contrast.  COMPARISON:  None.  FINDINGS: The ventricles are normal in size and position. There is no intracranial hemorrhage or intracranial mass effect. There is no evidence of an evolving ischemic infarction. The cerebellum and brainstem are normal in density.  At bone window settings the  observed portions of the paranasal sinuses and mastoid air cells are clear. There is no evidence of an acute skull fracture.  IMPRESSION: There is no acute intracranial abnormality. Specifically there are no findings to suggest intracranial mass effect nor acute ischemic or hemorrhagic events.   Electronically Signed   By: David  Martinique   On: 05/06/2013 14:56    EKG: Independently reviewed.  EKG showing sinus rhythm  Assessment/Plan Active Problems:   TGA (transient global amnesia)   HYPOTHYROIDISM   HYPERLIPIDEMIA   GERD (gastroesophageal reflux disease)   Hypothyroidism   Transient global amnesia   Amnesia   1. Suspected Global Transient Amnesia. Patient presenting with anterograde amnesia that started at approximately 10 AM this morning. Symptoms were of sudden onset and have persisted over the day. She is able to recognize family members, remember important events such as her birthday otherwise having difficulties forming new memories. She has repetitively asked questions about the day of the week and why she is at the hospital. Initial CT scan of brain without contrast was negative. Neurology evaluated patient and recommended a followup with MRI of the brain as well as EEG. Will admit her to telemetry. Neurology consulted, appreciate recommendations. 2. Hypothyroidism. Will continue her home dose of Synthroid, check a TSH. 3. Dyslipidemia. Continue statin  therapy 4. DVT prophylaxis. Lovenox    Code Status: Full Code Family Communication: I spoke with her husband present at bedside  Disposition Plan: Will place in overnight observation, I do not anticipate her requiring greater than 2 night hospitalization  Time spent: 55 minutes  Kelvin Cellar [facility] Pager [phone]

## 2013-05-06 NOTE — ED Notes (Signed)
Neurology at bedside.

## 2013-05-06 NOTE — Consult Note (Addendum)
NEURO HOSPITALIST CONSULT NOTE    Reason for Consult: sudden onset of short term memory loss  HPI:                                                                                                                                          Megan Booth is an 68 y.o. female who lives with her husband.  This AM she woke up and was acting her normal self. At 11 AM she asked her husband what the date was and he told her it was Jan 15th.  HE then states about 1 minute later she asked him what the date was again. This occurred 2 more times and he called EMS.  He states she is able to recognize him, and family members, tell her date of birth and old events but does not retain anything that had occurred in the last "5 minutes or so".  He denies noting any weakness or her complaining of decreased sensation, HA or visual problems.  She has no history of seizure or stroke in the past. Currently she is pleasantly sitting in the ED, able to tell me her age, year and date of birth, recognize people around her but has no idea how she arrived at the hospital or why.   Past Medical History  Diagnosis Date  . Hyperlipidemia   . GERD (gastroesophageal reflux disease)   . Hypothyroidism   . Diverticulosis   . Internal hemorrhoid   . Barrett esophagus 2003    Dr. Lajoyce Corners  . Sleep apnea   . Hx of colonic polyps     Dr. Lajoyce Corners   . Leukoplakia, vulva   . Anxiety     Past Surgical History  Procedure Laterality Date  . Lasik    . Breast enhancement surgery    . Dilation and curettage of uterus      x 2  . Hysteroscopy    . Tubal ligation      Family History  Problem Relation Age of Onset  . Heart disease Mother     pacemaker  . Hypertension Mother   . Depression Mother   . Diabetes Mother   . Dementia Mother   . Seizures Brother   . Lung cancer Father   . Cancer Father 63    lung  . Colon cancer Neg Hx     Social History:  reports that she has never smoked. She has never used  smokeless tobacco. She reports that she drinks about 0.6 ounces of alcohol per week. She reports that she does not use illicit drugs.  No Known Allergies  MEDICATIONS:  Current Facility-Administered Medications  Medication Dose Route Frequency Provider Last Rate Last Dose  . 0.9 %  sodium chloride infusion   Intravenous Continuous Leota Jacobsen, MD 125 mL/hr at 05/06/13 1510     Current Outpatient Prescriptions  Medication Sig Dispense Refill  . AMBULATORY NON FORMULARY MEDICATION Take 200 mg by mouth daily. Medication Name: Progesterone 200mg    1 po qhs  30 capsule  11  . AMBULATORY NON FORMULARY MEDICATION Take 100 mg by mouth at bedtime. Medication Name: Progesterone Capsule 100 mg  30 capsule  1  . AMBULATORY NON FORMULARY MEDICATION Medication Name: Biest (50:50) 2.0 mg + Progesterone 100 mg Capsule    1 po bid  60 capsule  5  . atorvastatin (LIPITOR) 40 MG tablet TAKE 1 TABLET DAILY  90 tablet  3  . Cyanocobalamin 1000 MCG CAPS Take one po daily  30 capsule  6  . esomeprazole (NEXIUM) 40 MG capsule Take 1 capsule (40 mg total) by mouth daily before breakfast.  90 capsule  3  . furosemide (LASIX) 20 MG tablet TAKE (1) TABLET DAILY AS NEEDED.  90 tablet  0  . ibuprofen (ADVIL,MOTRIN) 200 MG tablet Take 400 mg by mouth as needed.        . Naproxen Sodium (ALEVE) 220 MG CAPS Take 2 capsules by mouth as needed.        . NON FORMULARY BIEST 70/30  HAS PROGESTERONE IN IT      . phentermine (ADIPEX-P) 37.5 MG tablet       . predniSONE (DELTASONE) 10 MG tablet 3 tablets twice daily for 3 days, then 2 tablets twice daily for 3 days, then one tablet twice daily for 3 days, then 1 tablet daily  40 tablet  0  . Progesterone Micronized (PROGESTERONE, BULK,) POWD       . topiramate (TOPAMAX) 100 MG tablet Take 25 mg by mouth daily.      Marland Kitchen zolpidem (AMBIEN) 10 MG tablet Take 1 tablet  (10 mg total) by mouth at bedtime as needed for sleep.  30 tablet  3      ROS:                                                                                                                                       History obtained from the patient  General ROS: negative for - chills, fatigue, fever, night sweats, weight gain or weight loss Psychological ROS: negative for - behavioral disorder, hallucinations, memory difficulties, mood swings or suicidal ideation Ophthalmic ROS: negative for - blurry vision, double vision, eye pain or loss of vision ENT ROS: negative for - epistaxis, nasal discharge, oral lesions, sore throat, tinnitus or vertigo Allergy and Immunology ROS: negative for - hives or itchy/watery eyes Hematological and Lymphatic ROS: negative for - bleeding problems, bruising or swollen lymph nodes Endocrine ROS: negative for - galactorrhea, hair pattern changes, polydipsia/polyuria  or temperature intolerance Respiratory ROS: negative for - cough, hemoptysis, shortness of breath or wheezing Cardiovascular ROS: negative for - chest pain, dyspnea on exertion, edema or irregular heartbeat Gastrointestinal ROS: negative for - abdominal pain, diarrhea, hematemesis, nausea/vomiting or stool incontinence Genito-Urinary ROS: negative for - dysuria, hematuria, incontinence or urinary frequency/urgency Musculoskeletal ROS: negative for - joint swelling or muscular weakness Neurological ROS: as noted in HPI Dermatological ROS: negative for rash and skin lesion changes   Blood pressure 132/81, pulse 87, temperature 97.7 F (36.5 C), temperature source Oral, resp. rate 13, SpO2 97.00%.   Neurologic Examination:                                                                                                      Mental Status: Alert, oriented to hospital by looking around the room, able to tell me her birth date, president and recognizes family members.  After giving her 3 objects to  recall she can do so after 1 minute but after three minutes she was not able to recall.  Speech fluent without evidence of aphasia.  Able to follow 3 step commands without difficulty. Cranial Nerves: II: Discs flat bilaterally; Visual fields grossly normal, pupils equal, round, reactive to light and accommodation III,IV, VI: ptosis not present, extra-ocular motions intact bilaterally, bilateral inferior blepharospasm noted V,VII: smile symmetric, facial light touch sensation normal bilaterally VIII: hearing normal bilaterally IX,X: gag reflex present XI: bilateral shoulder shrug XII: midline tongue extension without atrophy or fasciculations Motor: Right : Upper extremity   5/5    Left:     Upper extremity   5/5  Lower extremity   5/5     Lower extremity   5/5 Tone and bulk:normal tone throughout; no atrophy noted Sensory: Pinprick and light touch intact throughout, bilaterally Deep Tendon Reflexes:  Right: Upper Extremity   Left: Upper extremity   biceps (C-5 to C-6) 2/4   biceps (C-5 to C-6) 2/4 tricep (C7) 2/4    triceps (C7) 2/4 Brachioradialis (C6) 2/4  Brachioradialis (C6) 2/4  Lower Extremity Lower Extremity  quadriceps (L-2 to L-4) 2/4   quadriceps (L-2 to L-4) 2/4 Achilles (S1) 2/4   Achilles (S1) 2/4  Plantars: Right: downgoing   Left: downgoing Cerebellar: normal finger-to-nose,  normal heel-to-shin test Gait: not tested CV: pulses palpable throughout    Lab Results: Basic Metabolic Panel: No results found for this basename: NA, K, CL, CO2, GLUCOSE, BUN, CREATININE, CALCIUM, MG, PHOS,  in the last 168 hours  Liver Function Tests: No results found for this basename: AST, ALT, ALKPHOS, BILITOT, PROT, ALBUMIN,  in the last 168 hours No results found for this basename: LIPASE, AMYLASE,  in the last 168 hours No results found for this basename: AMMONIA,  in the last 168 hours  CBC:  Recent Labs Lab 05/06/13 1500  WBC 7.4  NEUTROABS 4.4  HGB 13.7  HCT 39.2  MCV  90.5  PLT 277    Cardiac Enzymes: No results found for this basename: CKTOTAL, CKMB, CKMBINDEX, TROPONINI,  in the last 168 hours  Lipid Panel:  No results found for this basename: CHOL, TRIG, HDL, CHOLHDL, VLDL, LDLCALC,  in the last 168 hours  CBG: No results found for this basename: GLUCAP,  in the last 168 hours  Microbiology: Results for orders placed in visit on 04/23/11  WET PREP BY MOLECULAR PROBE     Status: None   Collection Time    04/23/11 12:00 AM      Result Value Range Status   Candida species NEG  Negative Final   Trichomonas vaginosis NEG  Negative Final   Gardnerella vaginalis NEG  Negative Final   Comment:       Performed at:  [facility] [address] [place], [place] [address]                    [id number]    Coagulation Studies: No results found for this basename: LABPROT, INR,  in the last 72 hours  Imaging: Ct Head Wo Contrast  05/06/2013   CLINICAL DATA:  Mental status change  EXAM: CT HEAD WITHOUT CONTRAST  TECHNIQUE: Contiguous axial images were obtained from the base of the skull through the vertex without intravenous contrast.  COMPARISON:  None.  FINDINGS: The ventricles are normal in size and position. There is no intracranial hemorrhage or intracranial mass effect. There is no evidence of an evolving ischemic infarction. The cerebellum and brainstem are normal in density.  At bone window settings the observed portions of the paranasal sinuses and mastoid air cells are clear. There is no evidence of an acute skull fracture.  IMPRESSION: There is no acute intracranial abnormality. Specifically there are no findings to suggest intracranial mass effect nor acute ischemic or hemorrhagic events.   Electronically Signed   By: David  Martinique   On: 05/06/2013 14:56    Etta Quill PA-C [facility] [phone]  05/06/2013, 3:59 PM  Patient seen  and examined.  Clinical course and management discussed.  Necessary edits performed.  I agree with the above.  Assessment and plan of care developed and discussed below.    Assessment/Plan: 68 year old female presenting with loss of memory, particularly short term.  Has memory of more remote events such as her marriage, address, etc.  Remaining neurological examination is unremarkable.  Suspect TGA.  Recommend work up to rule out stroke and seizure.  Suspect return to baseline within 24 hours.  Recommendations: 1.  MRI of the brain with and without contrast 2.  EEG 3.  Telemetry 4.  Frequent neuro checks  Case discussed with Dr. Everlene Farrier, MD [facility] [phone]  05/06/2013  4:13 PM

## 2013-05-06 NOTE — ED Provider Notes (Signed)
CSN: [id number]     Arrival date & time 05/06/13  1410 History   First MD Initiated Contact with Patient 05/06/13 1422     Chief Complaint  Patient presents with  . Altered Mental Status   (Consider location/radiation/quality/duration/timing/severity/associated sxs/prior Treatment) Patient is a 68 y.o. female presenting with altered mental status. The history is provided by the patient and the spouse.  Altered Mental Status  patient here with acute onset of short and long-term memory loss that began approximately 3 hours prior to arrival. No prior history of same. No recent history of head trauma. No recent illnesses. Denies any severe headaches or neck pain or syncope. Patient is alert to person but not situation. Symptoms have been persistent and no treatment used prior to arrival. Nothing makes it better or worse. Denies any use of new medications. No recent alcohol or illicit drug use. EMS was called and patient transported here  Past Medical History  Diagnosis Date  . Hyperlipidemia   . GERD (gastroesophageal reflux disease)   . Hypothyroidism   . Diverticulosis   . Internal hemorrhoid   . Barrett esophagus 2003    Dr. Lajoyce Corners  . Sleep apnea   . Hx of colonic polyps     Dr. Lajoyce Corners   . Leukoplakia, vulva   . Anxiety    Past Surgical History  Procedure Laterality Date  . Lasik    . Breast enhancement surgery    . Dilation and curettage of uterus      x 2  . Hysteroscopy    . Tubal ligation     Family History  Problem Relation Age of Onset  . Heart disease Mother     pacemaker  . Hypertension Mother   . Depression Mother   . Diabetes Mother   . Dementia Mother   . Seizures Brother   . Lung cancer Father   . Cancer Father 57    lung  . Colon cancer Neg Hx    History  Substance Use Topics  . Smoking status: Never Smoker   . Smokeless tobacco: Never Used  . Alcohol Use: 0.6 oz/week    1 Glasses of wine per week   OB History   Grav Para Term Preterm Abortions TAB SAB  Ect Mult Living   0              Review of Systems  All other systems reviewed and are negative.    Allergies  Review of patient's allergies indicates no known allergies.  Home Medications   Current Outpatient Rx  Name  Route  Sig  Dispense  Refill  . AMBULATORY NON FORMULARY MEDICATION   Oral   Take 200 mg by mouth daily. Medication Name: Progesterone 200mg    1 po qhs   30 capsule   11   . AMBULATORY NON FORMULARY MEDICATION   Oral   Take 100 mg by mouth at bedtime. Medication Name: Progesterone Capsule 100 mg   30 capsule   1   . AMBULATORY NON FORMULARY MEDICATION      Medication Name: Biest (50:50) 2.0 mg + Progesterone 100 mg Capsule    1 po bid   60 capsule   5   . atorvastatin (LIPITOR) 40 MG tablet      TAKE 1 TABLET DAILY   90 tablet   3   . Cyanocobalamin 1000 MCG CAPS      Take one po daily   30 capsule   6   .  esomeprazole (NEXIUM) 40 MG capsule   Oral   Take 1 capsule (40 mg total) by mouth daily before breakfast.   90 capsule   3   . furosemide (LASIX) 20 MG tablet      TAKE (1) TABLET DAILY AS NEEDED.   90 tablet   0   . ibuprofen (ADVIL,MOTRIN) 200 MG tablet   Oral   Take 400 mg by mouth as needed.           . Naproxen Sodium (ALEVE) 220 MG CAPS   Oral   Take 2 capsules by mouth as needed.           . NON FORMULARY      BIEST 70/30  HAS PROGESTERONE IN IT         . phentermine (ADIPEX-P) 37.5 MG tablet               . predniSONE (DELTASONE) 10 MG tablet      3 tablets twice daily for 3 days, then 2 tablets twice daily for 3 days, then one tablet twice daily for 3 days, then 1 tablet daily   40 tablet   0   . Progesterone Micronized (PROGESTERONE, BULK,) POWD               . topiramate (TOPAMAX) 100 MG tablet   Oral   Take 25 mg by mouth daily.         Marland Kitchen zolpidem (AMBIEN) 10 MG tablet   Oral   Take 1 tablet (10 mg total) by mouth at bedtime as needed for sleep.   30 tablet   3    BP 149/82   Pulse 94  Temp(Src) 97.7 F (36.5 C) (Oral)  Resp 16  SpO2 99% Physical Exam  Nursing note and vitals reviewed. Constitutional: She is oriented to person, place, and time. She appears well-developed and well-nourished.  Non-toxic appearance. No distress.  HENT:  Head: Normocephalic and atraumatic.  Eyes: Conjunctivae, EOM and lids are normal. Pupils are equal, round, and reactive to light.  Neck: Normal range of motion. Neck supple. No tracheal deviation present. No mass present.  Cardiovascular: Normal rate, regular rhythm and normal heart sounds.  Exam reveals no gallop.   No murmur heard. Pulmonary/Chest: Effort normal and breath sounds normal. No stridor. No respiratory distress. She has no decreased breath sounds. She has no wheezes. She has no rhonchi. She has no rales.  Abdominal: Soft. Normal appearance and bowel sounds are normal. She exhibits no distension. There is no tenderness. There is no rebound and no CVA tenderness.  Musculoskeletal: Normal range of motion. She exhibits no edema and no tenderness.  Neurological: She is alert and oriented to person, place, and time. She has normal strength. No cranial nerve deficit or sensory deficit. GCS eye subscore is 4. GCS verbal subscore is 5. GCS motor subscore is 6.  Skin: Skin is warm and dry. No abrasion and no rash noted.  Psychiatric: She has a normal mood and affect. Her speech is normal and behavior is normal.    ED Course  Procedures (including critical care time) Labs Review Labs Reviewed  CBC WITH DIFFERENTIAL  COMPREHENSIVE METABOLIC PANEL  ETHANOL  URINE RAPID DRUG SCREEN (HOSP PERFORMED)   Imaging Review No results found.  EKG Interpretation    Date/Time:  Wednesday May 06 2013 14:37:20 EST Ventricular Rate:  93 PR Interval:  166 QRS Duration: 89 QT Interval:  372 QTC Calculation: 463 R Axis:   68  Text Interpretation:  Sinus rhythm Baseline wander in lead(s) V3 Confirmed by Teah Votaw  MD, Waldron Gerry ([id number])  on 05/06/2013 2:43:50 PM            MDM  No diagnosis found. Patients case discussed with neurology, Dr. Doy Mince, who recommended patient have an MRI and to be admitted for TIA workup.    Leota Jacobsen, MD 05/06/13 [phone]

## 2013-05-06 NOTE — ED Notes (Signed)
Per pt's husband pt began to ask repetitive questions around noon asking what day it was, what time was it over and over again. Pt alert to self, place, disoriented to time, and situation. Per family pt has never had this happen before. Pt denies any pain at this time.

## 2013-05-06 NOTE — ED Notes (Signed)
Pt arrives via [facility] from home, pt husband called EMS and reported that pt was asking repeatedly the time and date. Upon EMS arrival, pt was unable to state who the president was but knew who her husband was, oriented to self but not situation or time. EMS reports no neuro deficits, no dizziness, ambulated well. EKG unremarkable. LSW 1100 AM

## 2013-05-06 NOTE — ED Notes (Signed)
Receiving nurse off the floor, to call back when she returns.

## 2013-05-07 ENCOUNTER — Observation Stay (HOSPITAL_COMMUNITY): Payer: Federal, State, Local not specified - PPO

## 2013-05-07 DIAGNOSIS — E785 Hyperlipidemia, unspecified: Secondary | ICD-10-CM

## 2013-05-07 DIAGNOSIS — G454 Transient global amnesia: Secondary | ICD-10-CM | POA: Diagnosis not present

## 2013-05-07 DIAGNOSIS — E039 Hypothyroidism, unspecified: Secondary | ICD-10-CM | POA: Diagnosis not present

## 2013-05-07 LAB — CBC
HCT: 37.8 % (ref 36.0–46.0)
Hemoglobin: 12.8 g/dL (ref 12.0–15.0)
MCH: 30.8 pg (ref 26.0–34.0)
MCHC: 33.9 g/dL (ref 30.0–36.0)
MCV: 90.9 fL (ref 78.0–100.0)
Platelets: 249 10*3/uL (ref 150–400)
RBC: 4.16 MIL/uL (ref 3.87–5.11)
RDW: 13.1 % (ref 11.5–15.5)
WBC: 6.1 10*3/uL (ref 4.0–10.5)

## 2013-05-07 LAB — BASIC METABOLIC PANEL
BUN: 16 mg/dL (ref 6–23)
CO2: 19 meq/L (ref 19–32)
Calcium: 8.6 mg/dL (ref 8.4–10.5)
Chloride: 110 mEq/L (ref 96–112)
Creatinine, Ser: 0.93 mg/dL (ref 0.50–1.10)
GFR calc Af Amer: 73 mL/min — ABNORMAL LOW (ref >90)
GFR calc non Af Amer: 63 mL/min — ABNORMAL LOW (ref >90)
Glucose, Bld: 98 mg/dL (ref 70–99)
Potassium: 3.6 mEq/L — ABNORMAL LOW (ref 3.7–5.3)
Sodium: 144 mEq/L (ref 137–147)

## 2013-05-07 LAB — TSH: TSH: 2.964 u[IU]/mL (ref 0.350–4.500)

## 2013-05-07 LAB — FOLATE: Folate: >20 ng/mL

## 2013-05-07 LAB — VITAMIN B12: VITAMIN B 12: 683 pg/mL (ref 211–911)

## 2013-05-07 MED ORDER — POTASSIUM CHLORIDE CRYS ER 20 MEQ PO TBCR
40.0000 meq | EXTENDED_RELEASE_TABLET | Freq: Once | ORAL | Status: AC
  Administered 2013-05-07: 40 meq via ORAL
  Filled 2013-05-07: qty 2

## 2013-05-07 NOTE — Progress Notes (Signed)
EEG Completed; Results Pending  

## 2013-05-07 NOTE — Progress Notes (Signed)
Subjective: Patient with sister in the room.  She reports that she is still amnestic of yesterday but feels she is back to baseline today.  Her sister agrees.  She feels that her memory started to return as of last evening.    Objective: Current vital signs: BP 132/82  Pulse 57  Temp(Src) 98.4 F (36.9 C) (Oral)  Resp 18  Ht 5\' 3"  (1.6 m)  Wt 74.526 kg (164 lb 4.8 oz)  BMI 29.11 kg/m2  SpO2 98% Vital signs in last 24 hours: Temp:  [97.7 F (36.5 C)-98.4 F (36.9 C)] 98.4 F (36.9 C) (01/15 0623) Pulse Rate:  [57-94] 57 (01/15 0623) Resp:  [13-20] 18 (01/15 0623) BP: (129-149)/(74-97) 132/82 mmHg (01/15 0623) SpO2:  [97 %-100 %] 98 % (01/15 0623) Weight:  [74.526 kg (164 lb 4.8 oz)] 74.526 kg (164 lb 4.8 oz) (01/14 2000)  Intake/Output from previous day:   Intake/Output this shift:   Nutritional status: General  Neurologic Exam: Mental Status:  Alert, oriented.  Thought content appropriate.  Speech fluent without evidence of aphasia. Able to follow 3 step commands without difficulty.  Cranial Nerves:  II: Discs flat bilaterally; Visual fields grossly normal, pupils equal, round, reactive to light and accommodation  III,IV, VI: ptosis not present, extra-ocular motions intact bilaterally, bilateral inferior blepharospasm noted  V,VII: smile symmetric, facial light touch sensation normal bilaterally  VIII: hearing normal bilaterally  IX,X: gag reflex present  XI: bilateral shoulder shrug  XII: midline tongue extension without atrophy or fasciculations  Motor:  5/5 throughout Sensory: Pinprick and light touch intact throughout, bilaterally  Deep Tendon Reflexes:  2+ throughout with absent AJ's bilaterally Plantars:  Right: downgoing   Left: downgoing  Cerebellar:  normal finger-to-nose, normal heel-to-shin test   Lab Results: Basic Metabolic Panel:  Recent Labs Lab 05/06/13 1500 05/07/13 0510  NA 140 144  K 4.1 3.6*  CL 104 110  CO2 23 19  GLUCOSE 113* 98   BUN 19 16  CREATININE 1.08 0.93  CALCIUM 9.5 8.6    Liver Function Tests:  Recent Labs Lab 05/06/13 1500  AST 15  ALT 20  ALKPHOS 72  BILITOT 0.3  PROT 7.1  ALBUMIN 4.1   No results found for this basename: LIPASE, AMYLASE,  in the last 168 hours No results found for this basename: AMMONIA,  in the last 168 hours  CBC:  Recent Labs Lab 05/06/13 1500 05/07/13 0510  WBC 7.4 6.1  NEUTROABS 4.4  --   HGB 13.7 12.8  HCT 39.2 37.8  MCV 90.5 90.9  PLT 277 249    Cardiac Enzymes: No results found for this basename: CKTOTAL, CKMB, CKMBINDEX, TROPONINI,  in the last 168 hours  Lipid Panel: No results found for this basename: CHOL, TRIG, HDL, CHOLHDL, VLDL, LDLCALC,  in the last 168 hours  CBG: No results found for this basename: GLUCAP,  in the last 168 hours  Microbiology: Results for orders placed in visit on 04/23/11  WET PREP BY MOLECULAR PROBE     Status: None   Collection Time    04/23/11 12:00 AM      Result Value Range Status   Candida species NEG  Negative Final   Trichomonas vaginosis NEG  Negative Final   Gardnerella vaginalis NEG  Negative Final   Comment:       Performed at:  [facility]                    [address], [place] [address]                    [id number]    Coagulation Studies: No results found for this basename: LABPROT, INR,  in the last 72 hours  Imaging: Ct Head Wo Contrast  05/06/2013   CLINICAL DATA:  Mental status change  EXAM: CT HEAD WITHOUT CONTRAST  TECHNIQUE: Contiguous axial images were obtained from the base of the skull through the vertex without intravenous contrast.  COMPARISON:  None.  FINDINGS: The ventricles are normal in size and position. There is no intracranial hemorrhage or intracranial mass effect. There is no evidence of an evolving ischemic infarction. The cerebellum and brainstem are normal in density.  At bone  window settings the observed portions of the paranasal sinuses and mastoid air cells are clear. There is no evidence of an acute skull fracture.  IMPRESSION: There is no acute intracranial abnormality. Specifically there are no findings to suggest intracranial mass effect nor acute ischemic or hemorrhagic events.   Electronically Signed   By: David  Martinique   On: 05/06/2013 14:56   Mr Brain W Wo Contrast  05/06/2013   CLINICAL DATA:  Acute memory loss.  Amnesia.  Altered mental status.  EXAM: MRI HEAD WITHOUT AND WITH CONTRAST  TECHNIQUE: Multiplanar, multiecho pulse sequences of the brain and surrounding structures were obtained without and with intravenous contrast.  CONTRAST:  74mL MULTIHANCE GADOBENATE DIMEGLUMINE 529 MG/ML IV SOLN  COMPARISON:  CT head from the same day.  FINDINGS: The diffusion-weighted images demonstrate no evidence for acute or subacute infarction. No hemorrhage or mass lesion is present. There is no significant white matter disease. Ventricles are of normal size. No significant extra-axial fluid collection is present.  Flow is present in the major intracranial arteries. The globes and orbits are intact. Paranasal sinuses and mastoid air cells are clear.  The postcontrast images demonstrate no pathologic enhancement.  IMPRESSION: Normal MRI of the brain without and with contrast.   Electronically Signed   By: Lawrence Santiago M.D.   On: 05/06/2013 17:51    Medications:  I have reviewed the patient's current medications. Scheduled: . aspirin EC  81 mg Oral Daily  . atorvastatin  40 mg Oral q morning - 10a  . enoxaparin (LOVENOX) injection  40 mg Subcutaneous Q24H  . sodium chloride  3 mL Intravenous Q12H  . topiramate  25 mg Oral Daily    Assessment/Plan: Patient back to baseline today as expected with TGA.  MRI of the brain reviewed and unremarkable.  EEG pending.    Recommendations: 1.  EEG today.  Department contacted.   2.  Once EEG reviewed patient stable for discharge  today.  Case discussed with Dr. Grandville Silos   LOS: 1 day   Alexis Goodell, MD [facility] [phone] 05/07/2013  9:57 AM

## 2013-05-07 NOTE — Procedures (Signed)
ELECTROENCEPHALOGRAM REPORT   Patient: Megan Booth       Room #: 8M57 EEG No. ID: [id number] Age: 68 y.o.        Sex: female Referring Physician: Grandville Silos Report Date:  05/07/2013        Interpreting Physician: Alexis Goodell D  History: [name] is an 67 y.o. female with an episode of amnesia  Medications:  Scheduled: . aspirin EC  81 mg Oral Daily  . atorvastatin  40 mg Oral q morning - 10a  . enoxaparin (LOVENOX) injection  40 mg Subcutaneous Q24H  . sodium chloride  3 mL Intravenous Q12H  . topiramate  25 mg Oral Daily    Conditions of Recording:  This is a 16 channel EEG carried out with the patient in the awake and drowsy states.  Description:  The waking background activity consists of a low voltage, symmetrical, fairly well organized, 10 Hz alpha activity, seen from the parieto-occipital and posterior temporal regions.  Low voltage fast activity, poorly organized, is seen anteriorly and is at times superimposed on more posterior regions.  A mixture of theta and alpha rhythms are seen from the central and temporal regions. The patient drowses with slowing to irregular, low voltage theta and beta activity.   Stage II sleep is not obtained. Hyperventilation was not performed.  Intermittent photic stimulation was performed and elicits a symmetrical driving response but fails to elicit any abnormalities.  IMPRESSION: Normal electroencephalogram, awake, drowsy and with activation procedures. There are no focal lateralizing or epileptiform features.   Alexis Goodell, MD [facility] [phone] 05/07/2013, 12:56 PM

## 2013-05-07 NOTE — Discharge Summary (Signed)
Physician Discharge Summary  Megan Booth NWG:[id number] DOB: 18-Apr-1946 DOA: 05/06/2013  PCP: Chancy Hurter, MD  Admit date: 05/06/2013 Discharge date: 05/07/2013  Time spent: 60 minutes  Recommendations for Outpatient Follow-up:  1. Followup with Chancy Hurter, MD in 1 week.  Discharge Diagnoses:  Principal Problem:   TGA (transient global amnesia) Active Problems:   HYPOTHYROIDISM   HYPERLIPIDEMIA   GERD (gastroesophageal reflux disease)   Hypothyroidism   Transient global amnesia   Amnesia   Discharge Condition: Stable and improved  Diet recommendation: Regular diet  Filed Weights   05/06/13 2000  Weight: 74.526 kg (164 lb 4.8 oz)    History of present illness:  Megan Booth is a 68 y.o. female with a past medical history of dyslipidemia, hypothyroidism, who was in her usual state health until approximately 10 AM her husband noted her to be acting differently. She asked him multiple times what day of the week it was which was unusual for her. He did not note slurred speech, facial droop, unilateral weakness, falls or seizure-like activity. She was able to recognize family members, remember her birthday, as well as other important events. It appeared that she could remember long-term events however had difficulties recalling short-term events. While in the emergency department she asked her husband multiple times while she was here. She has not had recent head trauma, nor has there been reports of headache, nausea, vomiting, blurry vision, fevers, chills, cough, sputum production or shortness of breath. A CT scan of brain without contrast performed in the emergency department showed no acute intracranial abnormality. Patient was seen and evaluated by neurology who recommended obtaining an MRI of brain, EEG, overnight stay with telemetry   Hospital Course:  #1 transient global amnesia Patient was admitted with short-term memory loss. Patient did have memory of  more remote events such as her marriage and her dress. Neurological exam which was done was unremarkable. Patient underwent a CT of the head which was negative. Patient underwent MRI of the head which was also negative. Patient subsequently underwent an EEG which was also negative. Patient improved clinically and was back to her baseline within 24 hours. Patient did not have any further episodes of amnesia. Patient be discharged home in stable and improved condition to followup with PCP as outpatient.  #2 hypothyroidism TSH which was done during the hospitalization was within normal limits at 2.964. Patient was maintained on a home regimen of Synthroid.  #3 hyperlipidemia Patient was maintained on a home regimen of a statin.  Procedures:  EEG 05/07/2013  CT head 05/06/2013  MRI head 05/06/2013  Consultations:  Neurology: Dr. Doy Mince 05/06/2013  Discharge Exam: Filed Vitals:   05/07/13 0623  BP: 132/82  Pulse: 57  Temp: 98.4 F (36.9 C)  Resp: 18    General: NAD Cardiovascular: RRR Respiratory: CTAB  Discharge Instructions      Discharge Orders   Future Appointments Provider Department Dept Phone   09/11/2013 8:15 AM [facility] [place] [facility] [place]   09/21/2013 8:15 AM Lisabeth Pick, MD [place] at [place]   Future Orders Complete By Expires   Diet general  As directed    Discharge instructions  As directed    Comments:     Follow up with Chancy Hurter, MD in 1 week.   Increase activity slowly  As directed        Medication List         atorvastatin 40 MG tablet  Commonly known  as:  LIPITOR  Take 40 mg by mouth daily at 6 PM.     esomeprazole 40 MG capsule  Commonly known as:  NEXIUM  Take 1 capsule (40 mg total) by mouth daily before breakfast.     furosemide 20 MG tablet  Commonly known as:  LASIX  Take 20 mg by mouth daily as needed for fluid or edema.     NONFORMULARY OR COMPOUNDED ITEM  Take  1 capsule by mouth daily. "Progesterone, Estradiol, and Estriol" capsule     pneumococcal 13-valent conjugate vaccine Susp injection  Commonly known as:  PREVNAR 13  Inject 0.5 mLs into the muscle once.     zolpidem 10 MG tablet  Commonly known as:  AMBIEN  Take 1 tablet (10 mg total) by mouth at bedtime as needed for sleep.       No Known Allergies Follow-up Information   Follow up with Chancy Hurter, MD. Schedule an appointment as soon as possible for a visit in 1 week.   Specialties:  Internal Medicine, Radiology   Contact information:   [place] [id number] [phone]        The results of significant diagnostics from this hospitalization (including imaging, microbiology, ancillary and laboratory) are listed below for reference.    Significant Diagnostic Studies: Ct Head Wo Contrast  05/06/2013   CLINICAL DATA:  Mental status change  EXAM: CT HEAD WITHOUT CONTRAST  TECHNIQUE: Contiguous axial images were obtained from the base of the skull through the vertex without intravenous contrast.  COMPARISON:  None.  FINDINGS: The ventricles are normal in size and position. There is no intracranial hemorrhage or intracranial mass effect. There is no evidence of an evolving ischemic infarction. The cerebellum and brainstem are normal in density.  At bone window settings the observed portions of the paranasal sinuses and mastoid air cells are clear. There is no evidence of an acute skull fracture.  IMPRESSION: There is no acute intracranial abnormality. Specifically there are no findings to suggest intracranial mass effect nor acute ischemic or hemorrhagic events.   Electronically Signed   By: David  Martinique   On: 05/06/2013 14:56   Mr Brain W Wo Contrast  05/06/2013   CLINICAL DATA:  Acute memory loss.  Amnesia.  Altered mental status.  EXAM: MRI HEAD WITHOUT AND WITH CONTRAST  TECHNIQUE: Multiplanar, multiecho pulse sequences of the brain and surrounding  structures were obtained without and with intravenous contrast.  CONTRAST:  40mL MULTIHANCE GADOBENATE DIMEGLUMINE 529 MG/ML IV SOLN  COMPARISON:  CT head from the same day.  FINDINGS: The diffusion-weighted images demonstrate no evidence for acute or subacute infarction. No hemorrhage or mass lesion is present. There is no significant white matter disease. Ventricles are of normal size. No significant extra-axial fluid collection is present.  Flow is present in the major intracranial arteries. The globes and orbits are intact. Paranasal sinuses and mastoid air cells are clear.  The postcontrast images demonstrate no pathologic enhancement.  IMPRESSION: Normal MRI of the brain without and with contrast.   Electronically Signed   By: Lawrence Santiago M.D.   On: 05/06/2013 17:51    Microbiology: No results found for this or any previous visit (from the past 240 hour(s)).   Labs: Basic Metabolic Panel:  Recent Labs Lab 05/06/13 1500 05/07/13 0510  NA 140 144  K 4.1 3.6*  CL 104 110  CO2 23 19  GLUCOSE 113* 98  BUN 19 16  CREATININE 1.08 0.93  CALCIUM 9.5 8.6   Liver Function Tests:  Recent Labs Lab 05/06/13 1500  AST 15  ALT 20  ALKPHOS 72  BILITOT 0.3  PROT 7.1  ALBUMIN 4.1   No results found for this basename: LIPASE, AMYLASE,  in the last 168 hours No results found for this basename: AMMONIA,  in the last 168 hours CBC:  Recent Labs Lab 05/06/13 1500 05/07/13 0510  WBC 7.4 6.1  NEUTROABS 4.4  --   HGB 13.7 12.8  HCT 39.2 37.8  MCV 90.5 90.9  PLT 277 249   Cardiac Enzymes: No results found for this basename: CKTOTAL, CKMB, CKMBINDEX, TROPONINI,  in the last 168 hours BNP: BNP (last 3 results) No results found for this basename: PROBNP,  in the last 8760 hours CBG: No results found for this basename: GLUCAP,  in the last 168 hours     Signed:  [facility] MD [facility] 05/07/2013, 1:57 PM

## 2013-05-07 NOTE — Progress Notes (Signed)
Patient d/ced home this afternoon, assessments remain unchanged as at now.

## 2013-05-11 ENCOUNTER — Ambulatory Visit (INDEPENDENT_AMBULATORY_CARE_PROVIDER_SITE_OTHER): Payer: Federal, State, Local not specified - PPO | Admitting: Family

## 2013-05-11 ENCOUNTER — Encounter: Payer: Self-pay | Admitting: Family

## 2013-05-11 VITALS — BP 98/62 | HR 82 | Wt 165.0 lb

## 2013-05-11 DIAGNOSIS — G454 Transient global amnesia: Secondary | ICD-10-CM

## 2013-05-11 DIAGNOSIS — F411 Generalized anxiety disorder: Secondary | ICD-10-CM

## 2013-05-11 MED ORDER — DIAZEPAM 2 MG PO TABS: 2.0000 mg | ORAL_TABLET | Freq: Two times a day (BID) | ORAL | Status: DC | PRN

## 2013-05-11 MED ORDER — CITALOPRAM HYDROBROMIDE 10 MG PO TABS: 10.0000 mg | ORAL_TABLET | Freq: Every day | ORAL | Status: DC

## 2013-05-11 NOTE — Patient Instructions (Signed)
Panic Attacks  Panic attacks are sudden, short-lived surges of severe anxiety, fear, or discomfort. They may occur for no reason when you are relaxed, when you are anxious, or when you are sleeping. Panic attacks may occur for a number of reasons:   · Healthy people occasionally have panic attacks in extreme, life-threatening situations, such as war or natural disasters. Normal anxiety is a protective mechanism of the body that helps us react to danger (fight or flight response).  · Panic attacks are often seen with anxiety disorders, such as panic disorder, social anxiety disorder, generalized anxiety disorder, and phobias. Anxiety disorders cause excessive or uncontrollable anxiety. They may interfere with your relationships or other life activities.  · Panic attacks are sometimes seen with other mental illnesses such as depression and posttraumatic stress disorder.  · Certain medical conditions, prescription medicines, and drugs of abuse can cause panic attacks.  SYMPTOMS   Panic attacks start suddenly, peak within 20 minutes, and are accompanied by four or more of the following symptoms:  · Pounding heart or fast heart rate (palpitations).  · Sweating.  · Trembling or shaking.  · Shortness of breath or feeling smothered.  · Feeling choked.  · Chest pain or discomfort.  · Nausea or strange feeling in your stomach.  · Dizziness, lightheadedness, or feeling like you will faint.  · Chills or hot flushes.  · Numbness or tingling in your lips or hands and feet.  · Feeling that things are not real or feeling that you are not yourself.  · Fear of losing control or going crazy.  · Fear of dying.  Some of these symptoms can mimic serious medical conditions. For example, you may think you are having a heart attack. Although panic attacks can be very scary, they are not life threatening.  DIAGNOSIS   Panic attacks are diagnosed through an assessment by your health care provider. Your health care provider will ask questions  about your symptoms, such as where and when they occurred. Your health care provider will also ask about your medical history and use of alcohol and drugs, including prescription medicines. Your health care provider may order blood tests or other studies to rule out a serious medical condition. Your health care provider may refer you to a mental health professional for further evaluation.  TREATMENT   · Most healthy people who have one or two panic attacks in an extreme, life-threatening situation will not require treatment.  · The treatment for panic attacks associated with anxiety disorders or other mental illness typically involves counseling with a mental health professional, medicine, or a combination of both. Your health care provider will help determine what treatment is best for you.  · Panic attacks due to physical illness usually goes away with treatment of the illness. If prescription medicine is causing panic attacks, talk with your health care provider about stopping the medicine, decreasing the dose, or substituting another medicine.  · Panic attacks due to alcohol or drug abuse goes away with abstinence. Some adults need professional help in order to stop drinking or using drugs.  HOME CARE INSTRUCTIONS   · Take all your medicines as prescribed.    · Check with your health care provider before starting new prescription or over-the-counter medicines.  · Keep all follow up appointments with your health care provider.  SEEK MEDICAL CARE IF:  · You are not able to take your medicines as prescribed.  · Your symptoms do not improve or get worse.  SEEK IMMEDIATE   MEDICAL CARE IF:   · You experience panic attack symptoms that are different than your usual symptoms.  · You have serious thoughts about hurting yourself or others.  · You are taking medicine for panic attacks and have a serious side effect.  MAKE SURE YOU:  · Understand these instructions.  · Will watch your condition.  · Will get help right away  if you are not doing well or get worse.  Document Released: 04/09/2005 Document Revised: 01/28/2013 Document Reviewed: 11/21/2012  ExitCare® Patient Information ©2014 [employer].

## 2013-05-11 NOTE — Progress Notes (Signed)
Subjective:    Patient ID: Megan Booth, female    DOB: 10/04/1945, 68 y.o.   MRN: [id number]  HPI 68 year old caucasian female, nonsmoker presents for follow-up after Emergency visit on 05/06/13.  She was confused, had short term memory loss, and was acting differently per her husband.No history of head injury or trauma.  MRI and CT scan of head was performed during ER visit and was normal. Multiple labs obtained and were all normal.  Patient has been under a lot of stress over the last year.  She recently was forced  to retire and she is taking care of mother in assisted  Living. Reports feeling anxious and frustrated at times.     Review of Systems  Constitutional: Negative.   HENT: Negative.   Eyes: Negative.   Respiratory: Negative.   Cardiovascular: Negative.   Gastrointestinal: Negative.   Endocrine: Negative.   Genitourinary: Negative.   Musculoskeletal: Negative.   Skin: Negative.   Neurological: Negative.   Hematological: Negative.   Psychiatric/Behavioral: Positive for confusion and agitation. The patient is nervous/anxious.    Past Medical History  Diagnosis Date  . Hyperlipidemia   . GERD (gastroesophageal reflux disease)   . Hypothyroidism   . Diverticulosis   . Internal hemorrhoid   . Barrett esophagus 2003    Dr. Lajoyce Corners  . Sleep apnea   . Hx of colonic polyps     Dr. Lajoyce Corners   . Leukoplakia, vulva   . Anxiety     History   Social History  . Marital Status: Married    Spouse Name: N/A    Number of Children: 0  . 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 Smoking status: Never Smoker   . Smokeless tobacco: Never Used  . Alcohol Use: 0.6 oz/week    1 Glasses of wine per week  . Drug Use: No  . Sexual Activity: No     Comment: BTL   Other Topics Concern  . Not on file   Social History Narrative   0 caffeine drinks     Past Surgical History  Procedure Laterality Date  . Lasik    .  Breast enhancement surgery    . Dilation and curettage of uterus      x 2  . Hysteroscopy    . Tubal ligation      Family History  Problem Relation Age of Onset  . Heart disease Mother     pacemaker  . Hypertension Mother   . Depression Mother   . Diabetes Mother   . Dementia Mother   . Seizures Brother   . Lung cancer Father   . Cancer Father 59    lung  . Colon cancer Neg Hx     No Known Allergies  Current Outpatient Prescriptions on File Prior to Visit  Medication Sig Dispense Refill  . atorvastatin (LIPITOR) 40 MG tablet Take 40 mg by mouth daily at 6 PM.      . esomeprazole (NEXIUM) 40 MG capsule Take 1 capsule (40 mg total) by mouth daily before breakfast.  90 capsule  3  . furosemide (LASIX) 20 MG tablet Take 20 mg by mouth daily as needed for fluid or edema.      . NONFORMULARY OR COMPOUNDED ITEM Take 1 capsule by mouth daily. "Progesterone, Estradiol, and Estriol" capsule      . pneumococcal 13-valent conjugate vaccine (PREVNAR  13) SUSP injection Inject 0.5 mLs into the muscle once.      Marland Kitchen zolpidem (AMBIEN) 10 MG tablet Take 1 tablet (10 mg total) by mouth at bedtime as needed for sleep.  30 tablet  3   No current facility-administered medications on file prior to visit.    BP 98/62  Pulse 82  Wt 165 lb (74.844 kg)chart    Objective:   Physical Exam  Constitutional: She is oriented to person, place, and time. She appears well-developed and well-nourished.  Tearful   Neck: Normal range of motion. Neck supple. No thyromegaly present.  Cardiovascular: Normal rate, regular rhythm and normal heart sounds.   Pulmonary/Chest: Effort normal and breath sounds normal.  Musculoskeletal: Normal range of motion.  Neurological: She is alert and oriented to person, place, and time.  Skin: Skin is warm and dry.  Psychiatric: She has a normal mood and affect.  Appeared anxious          Assessment & Plan:  Assessment 1.Transient Global Amnesia 2.  Anxiety  Plan 1. Celexa 10 mg daily. 2.Valium 2 mg as needed.  3.Set up appointment for Carotid Doppler to evaluate amnesia.  4. Call office if problems persists or worsen. I believe the underlying cause may be stress and anxiety.

## 2013-05-13 ENCOUNTER — Telehealth: Payer: Self-pay | Admitting: Internal Medicine

## 2013-05-13 MED ORDER — CITALOPRAM HYDROBROMIDE 10 MG PO TABS
10.0000 mg | ORAL_TABLET | Freq: Every day | ORAL | Status: DC
Start: 2013-05-13 — End: 2013-09-21

## 2013-05-13 NOTE — Telephone Encounter (Signed)
Done

## 2013-05-13 NOTE — Telephone Encounter (Signed)
Pt no longer uses [facility]. Please send in new rx  Citalopram to [facility]. Pt saw NP on 05-11-13

## 2013-05-15 ENCOUNTER — Ambulatory Visit (HOSPITAL_COMMUNITY): Payer: Federal, State, Local not specified - PPO | Attending: Cardiology

## 2013-05-15 ENCOUNTER — Encounter: Payer: Self-pay | Admitting: Cardiology

## 2013-05-15 DIAGNOSIS — I658 Occlusion and stenosis of other precerebral arteries: Secondary | ICD-10-CM | POA: Insufficient documentation

## 2013-05-15 DIAGNOSIS — R4182 Altered mental status, unspecified: Secondary | ICD-10-CM | POA: Insufficient documentation

## 2013-05-15 DIAGNOSIS — I10 Essential (primary) hypertension: Secondary | ICD-10-CM | POA: Insufficient documentation

## 2013-05-15 DIAGNOSIS — I6529 Occlusion and stenosis of unspecified carotid artery: Secondary | ICD-10-CM

## 2013-05-15 DIAGNOSIS — G454 Transient global amnesia: Secondary | ICD-10-CM

## 2013-05-15 DIAGNOSIS — R413 Other amnesia: Secondary | ICD-10-CM | POA: Insufficient documentation

## 2013-05-15 DIAGNOSIS — E785 Hyperlipidemia, unspecified: Secondary | ICD-10-CM | POA: Insufficient documentation

## 2013-05-28 ENCOUNTER — Telehealth: Payer: Self-pay | Admitting: Internal Medicine

## 2013-05-28 MED ORDER — ZOLPIDEM TARTRATE 10 MG PO TABS: 10.0000 mg | ORAL_TABLET | Freq: Every evening | ORAL | Status: DC | PRN

## 2013-05-28 NOTE — Telephone Encounter (Signed)
rx called in to [facility] 

## 2013-05-28 NOTE — Telephone Encounter (Signed)
Refill: #10 / 0 refills

## 2013-05-28 NOTE — Telephone Encounter (Signed)
[facility] requesting refill of zolpidem (AMBIEN) 10 MG tablet

## 2013-05-28 NOTE — Telephone Encounter (Signed)
Dr Leanne Chang, pt had a cpx on 09/19/12 with Padonda but she has seen you since 09/21/11.  Please advise if ok to refill

## 2013-05-29 ENCOUNTER — Encounter: Payer: Self-pay | Admitting: *Deleted

## 2013-06-01 ENCOUNTER — Encounter: Payer: Self-pay | Admitting: Family

## 2013-06-01 ENCOUNTER — Ambulatory Visit (INDEPENDENT_AMBULATORY_CARE_PROVIDER_SITE_OTHER): Payer: Federal, State, Local not specified - PPO | Admitting: Family

## 2013-06-01 VITALS — BP 110/70 | HR 87 | Ht 63.0 in | Wt 165.0 lb

## 2013-06-01 DIAGNOSIS — R0982 Postnasal drip: Secondary | ICD-10-CM

## 2013-06-01 DIAGNOSIS — R413 Other amnesia: Secondary | ICD-10-CM

## 2013-06-01 DIAGNOSIS — F411 Generalized anxiety disorder: Secondary | ICD-10-CM | POA: Diagnosis not present

## 2013-06-01 NOTE — Patient Instructions (Signed)
Stress Management Stress is a state of physical or mental tension that often results from changes in your life or normal routine. Some common causes of stress are:  Death of a loved one.  Injuries or severe illnesses.  Getting fired or changing jobs.  Moving into a new home. Other causes may be:  Sexual problems.  Business or financial losses.  Taking on a large debt.  Regular conflict with someone at home or at work.  Constant tiredness from lack of sleep. It is not just bad things that are stressful. It may be stressful to:  Win the lottery.  Get married.  Buy a new car. The amount of stress that can be easily tolerated varies from person to person. Changes generally cause stress, regardless of the types of change. Too much stress can affect your health. It may lead to physical or emotional problems. Too little stress (boredom) may also become stressful. SUGGESTIONS TO REDUCE STRESS:  Talk things over with your family and friends. It often is helpful to share your concerns and worries. If you feel your problem is serious, you may want to get help from a professional counselor.  Consider your problems one at a time instead of lumping them all together. Trying to take care of everything at once may seem impossible. List all the things you need to do and then start with the most important one. Set a goal to accomplish 2 or 3 things each day. If you expect to do too many in a single day you will naturally fail, causing you to feel even more stressed.  Do not use alcohol or drugs to relieve stress. Although you may feel better for a short time, they do not remove the problems that caused the stress. They can also be habit forming.  Exercise regularly - at least 3 times per week. Physical exercise can help to relieve that "uptight" feeling and will relax you.  The shortest distance between despair and hope is often a good night's sleep.  Go to bed and get up on time allowing  yourself time for appointments without being rushed.  Take a short "time-out" period from any stressful situation that occurs during the day. Close your eyes and take some deep breaths. Starting with the muscles in your face, tense them, hold it for a few seconds, then relax. Repeat this with the muscles in your neck, shoulders, hand, stomach, back and legs.  Take good care of yourself. Eat a balanced diet and get plenty of rest.  Schedule time for having fun. Take a break from your daily routine to relax. HOME CARE INSTRUCTIONS   Call if you feel overwhelmed by your problems and feel you can no longer manage them on your own.  Return immediately if you feel like hurting yourself or someone else. Document Released: 10/03/2000 Document Revised: 07/02/2011 Document Reviewed: 12/02/2012 ExitCare Patient Information 2014 [employer].  

## 2013-06-01 NOTE — Progress Notes (Signed)
Subjective:    Patient ID: Megan Booth, female    DOB: 1946-03-15, 68 y.o.   MRN: [id number]  HPI 68 y.o. White female presents today for follow up after hospital admission for Transient Global Amnesia and then started on celexa. Pt states that she is "feeling better" denies any further memory loss or change, denies head ache, dizziness.Had doppler performed after hospital visit, results discussed with pt. Pt feels the celexa is helping with her stress and anxiety but she is still having problems sleeping. Pt also has complaint of "cough" that has been going on for 30 days. The cough is non productive and is primarily in her throat. Denies fever, malaise, chills, weight change.     Review of Systems  Constitutional: Negative.   HENT: Positive for congestion.   Eyes: Negative.   Respiratory: Positive for cough.   Cardiovascular: Negative.   Gastrointestinal: Negative.   Endocrine: Negative.   Genitourinary: Negative.   Musculoskeletal: Negative.   Skin: Negative.   Allergic/Immunologic: Negative.   Neurological: Negative.   Hematological: Negative.   Psychiatric/Behavioral: Negative.        Past Medical History  Diagnosis Date  . Hyperlipidemia   . GERD (gastroesophageal reflux disease)   . Hypothyroidism   . Diverticulosis   . Internal hemorrhoid   . Barrett esophagus 2003    Dr. Lajoyce Corners  . Sleep apnea   . Hx of colonic polyps     Dr. Lajoyce Corners   . Leukoplakia, vulva   . Anxiety     History   Social History  . Marital Status: Married    Spouse Name: N/A    Number of Children: 0  . 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 Smoking status: Never Smoker   . Smokeless tobacco: Never Used  . Alcohol Use: 0.6 oz/week    1 Glasses of wine per week  . Drug Use: No  . Sexual Activity: No     Comment: BTL   Other Topics Concern  . Not on file   Social History Narrative   0 caffeine drinks     Past  Surgical History  Procedure Laterality Date  . Lasik    . Breast enhancement surgery    . Dilation and curettage of uterus      x 2  . Hysteroscopy    . Tubal ligation      Family History  Problem Relation Age of Onset  . Heart disease Mother     pacemaker  . Hypertension Mother   . Depression Mother   . Diabetes Mother   . Dementia Mother   . Seizures Brother   . Lung cancer Father   . Cancer Father 8    lung  . Colon cancer Neg Hx     No Known Allergies  Current Outpatient Prescriptions on File Prior to Visit  Medication Sig Dispense Refill  . atorvastatin (LIPITOR) 40 MG tablet Take 40 mg by mouth daily at 6 PM.      . citalopram (CELEXA) 10 MG tablet Take 1 tablet (10 mg total) by mouth daily.  30 tablet  3  . esomeprazole (NEXIUM) 40 MG capsule Take 1 capsule (40 mg total) by mouth daily before breakfast.  90 capsule  3  . furosemide (LASIX) 20 MG tablet Take 20 mg by mouth daily as needed for fluid or edema.      . NONFORMULARY  OR COMPOUNDED ITEM Take 1 capsule by mouth daily. "Progesterone, Estradiol, and Estriol" capsule      . zolpidem (AMBIEN) 10 MG tablet Take 1 tablet (10 mg total) by mouth at bedtime as needed for sleep.  10 tablet  0  . diazepam (VALIUM) 2 MG tablet Take 1 tablet (2 mg total) by mouth every 12 (twelve) hours as needed for anxiety.  30 tablet  1  . pneumococcal 13-valent conjugate vaccine (PREVNAR 13) SUSP injection Inject 0.5 mLs into the muscle once.       No current facility-administered medications on file prior to visit.    BP 110/70  Pulse 87  Ht 5\' 3"  (1.6 m)  Wt 165 lb (74.844 kg)  BMI 29.24 kg/m2chart Objective:   Physical Exam  Constitutional: She is oriented to person, place, and time. She appears well-developed and well-nourished. She is active.  HENT:  Head: Normocephalic.  Right Ear: Tympanic membrane, external ear and ear canal normal.  Left Ear: Tympanic membrane, external ear and ear canal normal.  Nose: Rhinorrhea  present.  Mouth/Throat: Uvula is midline, oropharynx is clear and moist and mucous membranes are normal.  Cardiovascular: Normal rate, regular rhythm, S1 normal, S2 normal and normal heart sounds.   Pulmonary/Chest: Effort normal and breath sounds normal.  Abdominal: Soft. Normal appearance and bowel sounds are normal.  Neurological: She is alert and oriented to person, place, and time. She has normal strength and normal reflexes.  Skin: Skin is warm, dry and intact.  Psychiatric: She has a normal mood and affect. Her speech is normal and behavior is normal. Judgment and thought content normal. Cognition and memory are normal.          Assessment & Plan:  68 y.o. White female who presents today for follow up after Transient global amnesia, then started on Celexa and carotid doppler study results. Pt also has chief complaint of "cough" -Transient Global Amnesia: RESOLVED. Pt reports no further problems, no changes in memory or cognition.  -Educate about stress reduction - Anxiety/stress: Continue celexa 10mg  p.o. Daily.   - Pt has Diazepam to help with sleep, used prn.  -Carotid Doppler Study: Results show mild plaque build up, will repeat in 2 years.  - Cough: pt will take Claritan OTC to help with post nasal drip.   Follow up- As needed.

## 2013-06-01 NOTE — Progress Notes (Signed)
Pre visit review using our clinic review tool, if applicable. No additional management support is needed unless otherwise documented below in the visit note. 

## 2013-06-04 ENCOUNTER — Encounter: Payer: Self-pay | Admitting: Internal Medicine

## 2013-06-29 ENCOUNTER — Telehealth: Payer: Self-pay | Admitting: Internal Medicine

## 2013-06-29 MED ORDER — ESOMEPRAZOLE MAGNESIUM 40 MG PO CPDR: 40.0000 mg | DELAYED_RELEASE_CAPSULE | Freq: Every day | ORAL | Status: DC

## 2013-06-29 NOTE — Telephone Encounter (Signed)
rx sent in electroncially 

## 2013-06-29 NOTE — Telephone Encounter (Signed)
Pt request new rx refill of esomeprazole (NEXIUM) 40 MG capsule to [facility]. Pt no longer has [facility] 30 day w/ refills.

## 2013-07-15 ENCOUNTER — Ambulatory Visit (INDEPENDENT_AMBULATORY_CARE_PROVIDER_SITE_OTHER): Payer: Federal, State, Local not specified - PPO | Admitting: Internal Medicine

## 2013-07-15 DIAGNOSIS — E538 Deficiency of other specified B group vitamins: Secondary | ICD-10-CM

## 2013-07-16 MED ORDER — CYANOCOBALAMIN 1000 MCG/ML IJ SOLN
1000.0000 ug | Freq: Once | INTRAMUSCULAR | Status: AC
  Administered 2013-07-15: 1000 ug via INTRAMUSCULAR

## 2013-08-21 ENCOUNTER — Telehealth: Payer: Self-pay | Admitting: Internal Medicine

## 2013-08-21 MED ORDER — ATORVASTATIN CALCIUM 40 MG PO TABS: 40.0000 mg | ORAL_TABLET | Freq: Every day | ORAL | Status: DC

## 2013-08-21 NOTE — Telephone Encounter (Signed)
[facility], [address] Is requesting re-fill on atorvastatin (LIPITOR) 40 MG tablet

## 2013-08-21 NOTE — Telephone Encounter (Signed)
rx sent in electronically 

## 2013-09-11 ENCOUNTER — Other Ambulatory Visit: Payer: Federal, State, Local not specified - PPO

## 2013-09-15 ENCOUNTER — Other Ambulatory Visit (INDEPENDENT_AMBULATORY_CARE_PROVIDER_SITE_OTHER): Payer: Medicare Other

## 2013-09-15 ENCOUNTER — Ambulatory Visit (INDEPENDENT_AMBULATORY_CARE_PROVIDER_SITE_OTHER): Payer: Medicare Other | Admitting: *Deleted

## 2013-09-15 DIAGNOSIS — Z79899 Other long term (current) drug therapy: Secondary | ICD-10-CM | POA: Diagnosis not present

## 2013-09-15 DIAGNOSIS — E538 Deficiency of other specified B group vitamins: Secondary | ICD-10-CM | POA: Diagnosis not present

## 2013-09-15 DIAGNOSIS — Z0000 Encounter for general adult medical examination without abnormal findings: Secondary | ICD-10-CM | POA: Diagnosis not present

## 2013-09-15 LAB — POCT URINALYSIS DIPSTICK
Glucose, UA: NEGATIVE
KETONES UA: NEGATIVE
Leukocytes, UA: NEGATIVE
Nitrite, UA: NEGATIVE
Urobilinogen, UA: 1
pH, UA: 6

## 2013-09-15 LAB — HEPATIC FUNCTION PANEL
ALT: 19 U/L (ref 0–35)
AST: 17 U/L (ref 0–37)
Albumin: 4 g/dL (ref 3.5–5.2)
Alkaline Phosphatase: 51 U/L (ref 39–117)
BILIRUBIN DIRECT: 0 mg/dL (ref 0.0–0.3)
BILIRUBIN TOTAL: 0.6 mg/dL (ref 0.2–1.2)
Total Protein: 6.8 g/dL (ref 6.0–8.3)

## 2013-09-15 LAB — LIPID PANEL
CHOLESTEROL: 187 mg/dL (ref 0–200)
HDL: 54 mg/dL (ref >39.00)
LDL Cholesterol: 101 mg/dL — ABNORMAL HIGH (ref 0–99)
Total CHOL/HDL Ratio: 3
Triglycerides: 162 mg/dL — ABNORMAL HIGH (ref 0.0–149.0)
VLDL: 32.4 mg/dL (ref 0.0–40.0)

## 2013-09-15 LAB — CBC WITH DIFFERENTIAL/PLATELET
BASOS PCT: 0.8 % (ref 0.0–3.0)
Basophils Absolute: 0.1 10*3/uL (ref 0.0–0.1)
EOS ABS: 0.1 10*3/uL (ref 0.0–0.7)
Eosinophils Relative: 2.3 % (ref 0.0–5.0)
HCT: 39.8 % (ref 36.0–46.0)
Hemoglobin: 13.3 g/dL (ref 12.0–15.0)
Lymphocytes Relative: 33.4 % (ref 12.0–46.0)
Lymphs Abs: 2.2 10*3/uL (ref 0.7–4.0)
MCHC: 33.5 g/dL (ref 30.0–36.0)
MCV: 93.6 fl (ref 78.0–100.0)
MONO ABS: 0.5 10*3/uL (ref 0.1–1.0)
Monocytes Relative: 7.4 % (ref 3.0–12.0)
NEUTROS PCT: 56.1 % (ref 43.0–77.0)
Neutro Abs: 3.7 10*3/uL (ref 1.4–7.7)
Platelets: 266 10*3/uL (ref 150.0–400.0)
RBC: 4.25 Mil/uL (ref 3.87–5.11)
RDW: 13 % (ref 11.5–15.5)
WBC: 6.6 10*3/uL (ref 4.0–10.5)

## 2013-09-15 LAB — BASIC METABOLIC PANEL
BUN: 21 mg/dL (ref 6–23)
CO2: 25 mEq/L (ref 19–32)
CREATININE: 1.1 mg/dL (ref 0.4–1.2)
Calcium: 9.4 mg/dL (ref 8.4–10.5)
Chloride: 106 mEq/L (ref 96–112)
GFR: 53.4 mL/min — AB (ref >60.00)
Glucose, Bld: 95 mg/dL (ref 70–99)
POTASSIUM: 3.6 meq/L (ref 3.5–5.1)
Sodium: 140 mEq/L (ref 135–145)

## 2013-09-15 LAB — TSH: TSH: 2.78 u[IU]/mL (ref 0.35–4.50)

## 2013-09-15 MED ORDER — CYANOCOBALAMIN 1000 MCG/ML IJ SOLN
1000.0000 ug | Freq: Once | INTRAMUSCULAR | Status: AC
  Administered 2013-09-15: 1000 ug via INTRAMUSCULAR

## 2013-09-20 NOTE — Progress Notes (Signed)
cpx  Past Medical History  Diagnosis Date  . Hyperlipidemia   . GERD (gastroesophageal reflux disease)   . Hypothyroidism   . Diverticulosis   . Internal hemorrhoid   . Barrett esophagus 2003    Dr. Lajoyce Corners  . Sleep apnea   . Hx of colonic polyps     Dr. Lajoyce Corners   . Leukoplakia, vulva   . Anxiety     History   Social History  . Marital Status: Married    Spouse Name: N/A    Number of Children: 0  . 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 Smoking status: Never Smoker   . Smokeless tobacco: Never Used  . Alcohol Use: 0.6 oz/week    1 Glasses of wine per week  . Drug Use: No  . Sexual Activity: No     Comment: BTL   Other Topics Concern  . Not on file   Social History Narrative   0 caffeine drinks     Past Surgical History  Procedure Laterality Date  . Lasik    . Breast enhancement surgery    . Dilation and curettage of uterus      x 2  . Hysteroscopy    . Tubal ligation      Family History  Problem Relation Age of Onset  . Heart disease Mother     pacemaker  . Hypertension Mother   . Depression Mother   . Diabetes Mother   . Dementia Mother   . Seizures Brother   . Lung cancer Father   . Cancer Father 81    lung  . Colon cancer Neg Hx     No Known Allergies  Current Outpatient Prescriptions on File Prior to Visit  Medication Sig Dispense Refill  . atorvastatin (LIPITOR) 40 MG tablet Take 1 tablet (40 mg total) by mouth daily at 6 PM.  90 tablet  0  . citalopram (CELEXA) 10 MG tablet Take 1 tablet (10 mg total) by mouth daily.  30 tablet  3  . diazepam (VALIUM) 2 MG tablet Take 1 tablet (2 mg total) by mouth every 12 (twelve) hours as needed for anxiety.  30 tablet  1  . esomeprazole (NEXIUM) 40 MG capsule Take 1 capsule (40 mg total) by mouth daily before breakfast.  90 capsule  3  . furosemide (LASIX) 20 MG tablet Take 20 mg by mouth daily as needed for fluid or edema.      .  NONFORMULARY OR COMPOUNDED ITEM Take 1 capsule by mouth daily. "Progesterone, Estradiol, and Estriol" capsule      . pneumococcal 13-valent conjugate vaccine (PREVNAR 13) SUSP injection Inject 0.5 mLs into the muscle once.      Marland Kitchen zolpidem (AMBIEN) 10 MG tablet Take 1 tablet (10 mg total) by mouth at bedtime as needed for sleep.  10 tablet  0   No current facility-administered medications on file prior to visit.     patient denies chest pain, shortness of breath, orthopnea. Denies lower extremity edema, abdominal pain, change in appetite, change in bowel movements. Patient denies rashes, musculoskeletal complaints. No other specific complaints in a complete review of systems.   Reviewed vitals   Well-developed well-nourished female in no acute distress. HEENT exam atraumatic, normocephalic, extraocular muscles are intact. Neck is supple. No jugular venous distention no thyromegaly. Chest clear to auscultation without increased work of breathing. Cardiac exam S1 and S2  are regular. Abdominal exam active bowel sounds, soft, nontender. Extremities no edema. Neurologic exam she is alert without any motor sensory deficits. Gait is normal.   Well visit- health maint utd  Note episode of TGA-- she has had no known recurrence - unclear cause. She was taking topiramate for weight loss- encouraged to stop topirimate  She admits to anxiety- uses celexa and ambien prn  Weight- she needs to lose weight. . Note 15 pounds weight gain since 5/14.

## 2013-09-21 ENCOUNTER — Encounter: Payer: Self-pay | Admitting: Internal Medicine

## 2013-09-21 ENCOUNTER — Ambulatory Visit (INDEPENDENT_AMBULATORY_CARE_PROVIDER_SITE_OTHER): Payer: Medicare Other | Admitting: Internal Medicine

## 2013-09-21 VITALS — BP 130/74 | HR 68 | Temp 98.0°F | Ht 63.0 in | Wt 168.0 lb

## 2013-09-21 DIAGNOSIS — K2270 Barrett's esophagus without dysplasia: Secondary | ICD-10-CM | POA: Diagnosis not present

## 2013-09-21 DIAGNOSIS — E663 Overweight: Secondary | ICD-10-CM | POA: Diagnosis not present

## 2013-09-21 DIAGNOSIS — Z0000 Encounter for general adult medical examination without abnormal findings: Secondary | ICD-10-CM | POA: Diagnosis not present

## 2013-09-21 DIAGNOSIS — E039 Hypothyroidism, unspecified: Secondary | ICD-10-CM | POA: Diagnosis not present

## 2013-09-21 MED ORDER — ESOMEPRAZOLE MAGNESIUM 40 MG PO CPDR: 40.0000 mg | DELAYED_RELEASE_CAPSULE | Freq: Every day | ORAL | Status: DC

## 2013-09-21 MED ORDER — ZOLPIDEM TARTRATE 10 MG PO TABS: 10.0000 mg | ORAL_TABLET | Freq: Every evening | ORAL | Status: DC | PRN

## 2013-09-21 MED ORDER — ATORVASTATIN CALCIUM 40 MG PO TABS: 40.0000 mg | ORAL_TABLET | Freq: Every day | ORAL | Status: DC

## 2013-09-21 MED ORDER — FUROSEMIDE 20 MG PO TABS: 20.0000 mg | ORAL_TABLET | Freq: Every day | ORAL | Status: DC | PRN

## 2013-09-21 MED ORDER — CITALOPRAM HYDROBROMIDE 10 MG PO TABS: 10.0000 mg | ORAL_TABLET | Freq: Every day | ORAL | Status: DC

## 2013-09-21 NOTE — Addendum Note (Signed)
Addended by: Townsend Roger D on: 09/21/2013 11:27 AM   Modules accepted: Orders

## 2013-09-21 NOTE — Progress Notes (Signed)
Pre visit review using our clinic review tool, if applicable. No additional management support is needed unless otherwise documented below in the visit note. 

## 2013-09-23 ENCOUNTER — Encounter: Payer: Self-pay | Admitting: Dietician

## 2013-09-23 ENCOUNTER — Encounter: Payer: Medicare Other | Attending: Internal Medicine | Admitting: Dietician

## 2013-09-23 DIAGNOSIS — Z713 Dietary counseling and surveillance: Secondary | ICD-10-CM | POA: Diagnosis not present

## 2013-09-23 DIAGNOSIS — R635 Abnormal weight gain: Secondary | ICD-10-CM | POA: Insufficient documentation

## 2013-09-23 NOTE — Progress Notes (Signed)
  Medical Nutrition Therapy:  Appt start time: 1500 end time:  0940   Assessment:  Primary concerns today: Megan Booth states that she is here because she gained 15 pounds in the last year. She reports that the past year has been stressful. She was recently hospitalized for Transient Global Amnesia and is having a lot of anxiety as a result. She has become an emotional eater; she has anxiety about going to sleep and snacks late at night. She is also recently retired from [employer] and is having difficulty establishing a routine. She lives with her husband. Megan Booth reports trying many diets and pills in the past 20 years.   Preferred Learning Style:   No preference indicated   Learning Readiness:   Contemplating  Ready   MEDICATIONS: see list   DIETARY INTAKE:  Avoided foods include caffeine.    24-hr recall:  B ( AM): skips  Snk ( AM): none  L (12-2 PM): 1/2 a sandwich with chips Snk ( PM): none D ( PM): salmon on salad with sweet potato; steak once every 2 weeks Snk ( PM): 100-calorie ice cream bar, M&Ms  Beverages: water, unsweetened tea, occasional caffeine free diet coke, decaf coffee, wine once a week  Usual physical activity: 10,000 steps a day (about 3 miles in the morning since April)  Estimated energy needs: 1600-1800 calories   Progress Towards Goal(s):  No progress.   Nutritional Diagnosis:  [place]-3.4 Unintentional weight gain As related to excessive energy intake and stress.  As evidenced by 15 lb weight gain in the past year.    Intervention:  Nutrition counseling provided.  Teaching Method Utilized:  Auditory  Handouts given during visit include:  15g CHO + protein snacks  Barriers to learning/adherence to lifestyle change: anxiety  Demonstrated degree of understanding via:  Teach Back   Monitoring/Evaluation:  Dietary intake, exercise, and body weight prn.

## 2013-09-23 NOTE — Patient Instructions (Addendum)
-  Work on finding something to keep busy at night instead of snacking -Have something for breakfast - fruit -Find some healthy snacks that you like  -Continue to avoid keeping "trigger foods" in the house  -Avoid going to the grocery store hungry -Increase water intake -Pre-portion foods like crackers and sweets  -Try measuring out foods -Try eating off of smaller plates

## 2013-09-24 ENCOUNTER — Telehealth: Payer: Self-pay | Admitting: *Deleted

## 2013-09-24 ENCOUNTER — Other Ambulatory Visit (INDEPENDENT_AMBULATORY_CARE_PROVIDER_SITE_OTHER): Payer: Medicare Other

## 2013-09-24 DIAGNOSIS — E538 Deficiency of other specified B group vitamins: Secondary | ICD-10-CM

## 2013-09-24 LAB — VITAMIN B12: VITAMIN B 12: 481 pg/mL (ref 211–911)

## 2013-09-24 NOTE — Telephone Encounter (Signed)
Spoke with patient and she will come for lab. 

## 2013-09-24 NOTE — Telephone Encounter (Signed)
Left a message for patient to call me. 

## 2013-09-24 NOTE — Telephone Encounter (Signed)
Message copied by Hulan Saas on Thu Sep 24, 2013  9:24 AM ------      Message from: Hulan Saas      Created: Thu Mar 26, 2013  8:42 AM       Call and remind patient due for B12 level for DB on 09/28/13 ------

## 2013-09-25 ENCOUNTER — Other Ambulatory Visit: Payer: Self-pay | Admitting: *Deleted

## 2013-09-25 ENCOUNTER — Telehealth: Payer: Self-pay | Admitting: Internal Medicine

## 2013-09-25 DIAGNOSIS — E538 Deficiency of other specified B group vitamins: Secondary | ICD-10-CM

## 2013-09-25 NOTE — Telephone Encounter (Signed)
See results note. 

## 2013-09-30 ENCOUNTER — Encounter: Payer: Self-pay | Admitting: Internal Medicine

## 2013-10-15 DIAGNOSIS — Z1231 Encounter for screening mammogram for malignant neoplasm of breast: Secondary | ICD-10-CM | POA: Diagnosis not present

## 2013-10-16 DIAGNOSIS — Z124 Encounter for screening for malignant neoplasm of cervix: Secondary | ICD-10-CM | POA: Diagnosis not present

## 2013-10-16 DIAGNOSIS — Z01419 Encounter for gynecological examination (general) (routine) without abnormal findings: Secondary | ICD-10-CM | POA: Diagnosis not present

## 2013-10-28 ENCOUNTER — Encounter: Payer: Self-pay | Admitting: Family

## 2013-11-03 ENCOUNTER — Ambulatory Visit (INDEPENDENT_AMBULATORY_CARE_PROVIDER_SITE_OTHER): Payer: Medicare Other | Admitting: Internal Medicine

## 2013-11-03 ENCOUNTER — Encounter: Payer: Self-pay | Admitting: Internal Medicine

## 2013-11-03 ENCOUNTER — Ambulatory Visit: Payer: Medicare Other | Admitting: Internal Medicine

## 2013-11-03 VITALS — BP 122/74 | HR 66 | Ht 63.0 in | Wt 170.0 lb

## 2013-11-03 DIAGNOSIS — R1319 Other dysphagia: Secondary | ICD-10-CM

## 2013-11-03 DIAGNOSIS — K2270 Barrett's esophagus without dysplasia: Secondary | ICD-10-CM | POA: Diagnosis not present

## 2013-11-03 NOTE — Patient Instructions (Addendum)
You have been scheduled for an endoscopy. Please follow written instructions given to you at your visit today. If you use inhalers (even only as needed), please bring them with you on the day of your procedure. Your physician has requested that you go to www.startemmi.com and enter the access code given to you at your visit today. This web site gives a general overview about your procedure. However, you should still follow specific instructions given to you by our office regarding your preparation for the procedure.  Please continue Nexium 40 mg every morning and Pepcid 20 mg every night. You may add Gaviscon 2 chewable tablets as needed for regurgitation (this may be purchased over the counter).  Please decrease your Aleve dose!  Dr Leanne Chang

## 2013-11-03 NOTE — Progress Notes (Signed)
Megan Booth 07-05-1945 [id number]  Note: This dictation was prepared with Dragon digital system. Any transcriptional errors that result from this procedure are unintentional.   History of Present Illness: This is a 68 year old white female with chronic gastroesophageal reflux last upper endoscopy in 2006. History of Barrett's esophagus previously followed by Dr.Orr. She has been having breakthrough symptoms on Nexium 40 mg every morning and Pepcid 20 mg at night which she takes when necessary regurgitation and reflux. She wakes up at night with choking spells. She has also noticed dysphagia to solid solids as well as liquids as if she had a spasm in her throat. The food always seemed to pass through. She has been taking Aleve 2 tablets twice a day for back pain. She is up-to-date on her colonoscopy which was done in 2004 and again in May 2011 and showed mild diverticulosis.     Past Medical History  Diagnosis Date  . Hyperlipidemia   . GERD (gastroesophageal reflux disease)   . Hypothyroidism   . Diverticulosis   . Internal hemorrhoid   . Barrett esophagus 2003    Dr. Lajoyce Corners  . Sleep apnea   . Hx of colonic polyps     Dr. Lajoyce Corners   . Leukoplakia, vulva   . Anxiety   . Amnesia, global, transient 2015    see hospital notes    Past Surgical History  Procedure Laterality Date  . Lasik    . Breast enhancement surgery    . Dilation and curettage of uterus      x 2  . Hysteroscopy    . Tubal ligation      No Known Allergies  Family history and social history have been reviewed.  Review of Systems:   The remainder of the 10 point ROS is negative except as outlined in the H&P  Physical Exam: General Appearance Well developed, in no distress Psychological Normal mood and affect  Assessment and Plan:   68 year old white female with the gastroesophageal reflux refractory to  acid suppressing regimen. She will call reduce her Aleve or even stop it also possibly pill esophagitis  and gastritis related to NSAID's. Her dysphagia sounds more like a dysmotility problem or possibly a polyp esophagitis related to reflux causing motility dysfunction. She will continue on Nexium and Pepcid and we will schedule her for upper endoscopy to rule out Barrett's esophagus. Possible dilation if she has a stricture. If no stricture found and her dysphagia continues we'll decide barium swallow may be helpful.    Delfin Edis 11/03/2013

## 2013-11-18 ENCOUNTER — Encounter: Payer: Self-pay | Admitting: Internal Medicine

## 2013-12-01 ENCOUNTER — Other Ambulatory Visit: Payer: Self-pay | Admitting: Internal Medicine

## 2013-12-02 MED ORDER — ZOLPIDEM TARTRATE 10 MG PO TABS: 5.0000 mg | ORAL_TABLET | Freq: Every evening | ORAL | Status: DC | PRN

## 2014-01-04 ENCOUNTER — Encounter: Payer: Self-pay | Admitting: Internal Medicine

## 2014-01-04 ENCOUNTER — Ambulatory Visit (AMBULATORY_SURGERY_CENTER): Payer: Medicare Other | Admitting: Internal Medicine

## 2014-01-04 VITALS — BP 124/81 | HR 62 | Temp 98.0°F | Resp 16 | Ht 63.0 in | Wt 170.0 lb

## 2014-01-04 DIAGNOSIS — K2270 Barrett's esophagus without dysplasia: Secondary | ICD-10-CM

## 2014-01-04 DIAGNOSIS — G4733 Obstructive sleep apnea (adult) (pediatric): Secondary | ICD-10-CM | POA: Diagnosis not present

## 2014-01-04 DIAGNOSIS — K219 Gastro-esophageal reflux disease without esophagitis: Secondary | ICD-10-CM | POA: Diagnosis not present

## 2014-01-04 DIAGNOSIS — K222 Esophageal obstruction: Secondary | ICD-10-CM | POA: Diagnosis not present

## 2014-01-04 DIAGNOSIS — R1310 Dysphagia, unspecified: Secondary | ICD-10-CM | POA: Diagnosis not present

## 2014-01-04 MED ORDER — SODIUM CHLORIDE 0.9 % IV SOLN: 500.0000 mL | INTRAVENOUS | Status: DC

## 2014-01-04 NOTE — Progress Notes (Signed)
A/ox3 pleased with MAC, report to Karen RN 

## 2014-01-04 NOTE — Patient Instructions (Signed)
YOU HAD AN ENDOSCOPIC PROCEDURE TODAY AT [facility]: Refer to the procedure report that was given to you for any specific questions about what was found during the examination.  If the procedure report does not answer your questions, please call your gastroenterologist to clarify.  If you requested that your care partner not be given the details of your procedure findings, then the procedure report has been included in a sealed envelope for you to review at your convenience later.  YOU SHOULD EXPECT: Some feelings of bloating in the abdomen. Passage of more gas than usual.  Walking can help get rid of the air that was put into your GI tract during the procedure and reduce the bloating. If you had a lower endoscopy (such as a colonoscopy or flexible sigmoidoscopy) you may notice spotting of blood in your stool or on the toilet paper. If you underwent a bowel prep for your procedure, then you may not have a normal bowel movement for a few days.  DIET:  NOTHING TO EAT OR DRINK UNTIL 10:00. 10:00 UNTIL 11:00 ONLY CLEAR LIQUIDS. AFTER 11:00 ONLY SOFT FOODS. RESUME YOUR DIET IN AM ACTIVITY: Your care partner should take you home directly after the procedure.  You should plan to take it easy, moving slowly for the rest of the day.  You can resume normal activity the day after the procedure however you should NOT DRIVE or use heavy machinery for 24 hours (because of the sedation medicines used during the test).    SYMPTOMS TO REPORT IMMEDIATELY: A gastroenterologist can be reached at any hour.  During normal business hours, 8:30 AM to 5:00 PM Monday through Friday, call [phone].  After hours and on weekends, please call the GI answering service at [phone] who will take a message and have the physician on call contact you.   Following upper endoscopy (EGD)  Vomiting of blood or coffee ground material  New chest pain or pain under the shoulder blades  Painful or persistently  difficult swallowing  New shortness of breath  Fever of 100F or higher  Black, tarry-looking stools  FOLLOW UP: If any biopsies were taken you will be contacted by phone or by letter within the next 1-3 weeks.  Call your gastroenterologist if you have not heard about the biopsies in 3 weeks.  Our staff will call the home number listed on your records the next business day following your procedure to check on you and address any questions or concerns that you may have at that time regarding the information given to you following your procedure. This is a courtesy call and so if there is no answer at the home number and we have not heard from you through the emergency physician on call, we will assume that you have returned to your regular daily activities without incident.  SIGNATURES/CONFIDENTIALITY: You and/or your care partner have signed paperwork which will be entered into your electronic medical record.  These signatures attest to the fact that that the information above on your After Visit Summary has been reviewed and is understood.  Full responsibility of the confidentiality of this discharge information lies with you and/or your care-partner.

## 2014-01-04 NOTE — Op Note (Signed)
[address]  [address] [place], [phone]   ENDOSCOPY PROCEDURE REPORT  PATIENT: Megan Booth, Megan Booth  MR#: [id number] BIRTHDATE: October 30, 1945 , 76  yrs. old GENDER: Female ENDOSCOPIST: Lafayette Dragon, MD REFERRED BY:  Phoebe Sharps, M.D. PROCEDURE DATE:  01/04/2014 PROCEDURE:  EGD w/ biopsy and Savary dilation of esophagus ASA CLASS:     Class II INDICATIONS:  Dysphagia.   last upper endoscopy in 2006 showed Barrett's esophagus.  She has been on Nexium 40 mg and Pepcid 20 mg.  Symptoms have been controlled as long as she takes medications. MEDICATIONS: MAC sedation, administered by CRNA and Propofol (Diprivan) 180 mg IV TOPICAL ANESTHETIC: none  DESCRIPTION OF PROCEDURE: After the risks benefits and alternatives of the procedure were thoroughly explained, informed consent was obtained.  The [id number] endoscope was introduced through the mouth and advanced to the second portion of the duodenum. Without limitations.  The instrument was slowly withdrawn as the mucosa was fully examined.      Esophagjus: [proximal and mid esophageal mucosa were normal. There was a mild benign-appearing distal esophageal stricture at the level of 35 cm from the incisors. It allowed the endoscope to traverse into hiatal hernia. There was no active esophagitis. And from the squamocolumnar junction which appeared slightly irregular. Stomach: the gastric folds were normal. There were multiple fundic gland polyps in the gastric body. Gastric antral and pyloric outlet was normal. Retroflexion of the endoscope confirmed presence of hiatal herniaDuodenum: duodenal bulb and descending duodenum was normalSavary dilator 1516 and 17 mm size over a guidewire the esophageal stricture without significant resistance. There was small amount of blood on the dilators.          The scope was then withdrawn from the patient and the procedure completed.  COMPLICATIONS: There were no  complications. ENDOSCOPIC IMPRESSION: Barrett's esophagus. Status post biopsy from squamocolumnar junction 3 cm nonreducible hiatal hernia Mild distal esophageal stricture. Dilated with 15,16 and 17 mm Savary dilators RECOMMENDATIONS: 1.  Await pathology results 2.  Anti-reflux regimen to be follow 3.  Continue current medications  REPEAT EXAM: for EGD pending biopsy results.  eSigned:  Lafayette Dragon, MD 01/04/2014 9:00 AM   CC:

## 2014-01-04 NOTE — Progress Notes (Signed)
Called to room to assist during endoscopic procedure.  Patient ID and intended procedure confirmed with present staff. Received instructions for my participation in the procedure from the performing physician.  

## 2014-01-05 ENCOUNTER — Telehealth: Payer: Self-pay | Admitting: *Deleted

## 2014-01-05 NOTE — Telephone Encounter (Signed)
  Follow up Call-  Call back number 01/04/2014  Post procedure Call Back phone  # [phone]  Permission to leave phone message Yes     Patient questions:  Do you have a fever, pain , or abdominal swelling? No. Pain Score  0 *  Have you tolerated food without any problems? Yes.    Have you been able to return to your normal activities? Yes.    Do you have any questions about your discharge instructions: Diet   No. Medications  No. Follow up visit  No.  Do you have questions or concerns about your Care? No.  Actions: * If pain score is 4 or above: No action needed, pain <4.

## 2014-01-06 ENCOUNTER — Encounter: Payer: Medicare Other | Admitting: Internal Medicine

## 2014-01-07 ENCOUNTER — Encounter: Payer: Self-pay | Admitting: Internal Medicine

## 2014-01-19 ENCOUNTER — Ambulatory Visit (INDEPENDENT_AMBULATORY_CARE_PROVIDER_SITE_OTHER): Payer: Medicare Other | Admitting: Family

## 2014-01-19 ENCOUNTER — Encounter: Payer: Self-pay | Admitting: Family

## 2014-01-19 VITALS — BP 122/78 | HR 85 | Ht 63.0 in | Wt 173.7 lb

## 2014-01-19 DIAGNOSIS — K2100 Gastro-esophageal reflux disease with esophagitis, without bleeding: Secondary | ICD-10-CM | POA: Diagnosis not present

## 2014-01-19 DIAGNOSIS — Z23 Encounter for immunization: Secondary | ICD-10-CM | POA: Diagnosis not present

## 2014-01-19 DIAGNOSIS — F329 Major depressive disorder, single episode, unspecified: Secondary | ICD-10-CM

## 2014-01-19 DIAGNOSIS — F3289 Other specified depressive episodes: Secondary | ICD-10-CM

## 2014-01-19 DIAGNOSIS — E7800 Pure hypercholesterolemia, unspecified: Secondary | ICD-10-CM

## 2014-01-19 DIAGNOSIS — G4700 Insomnia, unspecified: Secondary | ICD-10-CM | POA: Diagnosis not present

## 2014-01-19 DIAGNOSIS — N951 Menopausal and female climacteric states: Secondary | ICD-10-CM | POA: Diagnosis not present

## 2014-01-19 DIAGNOSIS — E039 Hypothyroidism, unspecified: Secondary | ICD-10-CM | POA: Diagnosis not present

## 2014-01-19 DIAGNOSIS — F32A Depression, unspecified: Secondary | ICD-10-CM

## 2014-01-19 LAB — CBC WITH DIFFERENTIAL/PLATELET
BASOS PCT: 0.4 % (ref 0.0–3.0)
Basophils Absolute: 0 10*3/uL (ref 0.0–0.1)
EOS ABS: 0.1 10*3/uL (ref 0.0–0.7)
EOS PCT: 1.6 % (ref 0.0–5.0)
HEMATOCRIT: 38.9 % (ref 36.0–46.0)
Hemoglobin: 13.2 g/dL (ref 12.0–15.0)
LYMPHS ABS: 2 10*3/uL (ref 0.7–4.0)
Lymphocytes Relative: 34.4 % (ref 12.0–46.0)
MCHC: 33.9 g/dL (ref 30.0–36.0)
MCV: 92.7 fl (ref 78.0–100.0)
MONO ABS: 0.5 10*3/uL (ref 0.1–1.0)
Monocytes Relative: 8.2 % (ref 3.0–12.0)
Neutro Abs: 3.1 10*3/uL (ref 1.4–7.7)
Neutrophils Relative %: 55.4 % (ref 43.0–77.0)
Platelets: 245 10*3/uL (ref 150.0–400.0)
RBC: 4.2 Mil/uL (ref 3.87–5.11)
RDW: 13.6 % (ref 11.5–15.5)
WBC: 5.7 10*3/uL (ref 4.0–10.5)

## 2014-01-19 LAB — HEPATIC FUNCTION PANEL
ALBUMIN: 3.8 g/dL (ref 3.5–5.2)
ALK PHOS: 57 U/L (ref 39–117)
ALT: 20 U/L (ref 0–35)
AST: 20 U/L (ref 0–37)
Bilirubin, Direct: 0 mg/dL (ref 0.0–0.3)
TOTAL PROTEIN: 6.9 g/dL (ref 6.0–8.3)
Total Bilirubin: 0.4 mg/dL (ref 0.2–1.2)

## 2014-01-19 LAB — BASIC METABOLIC PANEL
BUN: 15 mg/dL (ref 6–23)
CHLORIDE: 109 meq/L (ref 96–112)
CO2: 25 meq/L (ref 19–32)
Calcium: 9.1 mg/dL (ref 8.4–10.5)
Creatinine, Ser: 1 mg/dL (ref 0.4–1.2)
GFR: 56.94 mL/min — ABNORMAL LOW (ref >60.00)
GLUCOSE: 94 mg/dL (ref 70–99)
POTASSIUM: 4.3 meq/L (ref 3.5–5.1)
SODIUM: 139 meq/L (ref 135–145)

## 2014-01-19 LAB — TSH: TSH: 2.5 u[IU]/mL (ref 0.35–4.50)

## 2014-01-19 MED ORDER — VENLAFAXINE HCL ER 37.5 MG PO CP24: 37.5000 mg | ORAL_CAPSULE | Freq: Every day | ORAL | Status: DC

## 2014-01-19 NOTE — Progress Notes (Signed)
Pre visit review using our clinic review tool, if applicable. No additional management support is needed unless otherwise documented below in the visit note. 

## 2014-01-19 NOTE — Progress Notes (Signed)
Subjective:    Patient ID: Megan Booth, female    DOB: 08/23/1945, 68 y.o.   MRN: [id number]  HPI 68 year old white female, nonsmoker with a history of hyperlipidemia, there is esophagitis, anxiety, GERD, insomnia, hypothyroidism, menopausal symptoms and in today for recheck. She has concerns uncontrolled hot flashes. Has been on compound medication through gynecology has been ineffective as well as hormone replacement therapy. Is currently on Celexa 10 mg once daily for anxiety. Takes Ambien 5 mg at bedtime to help with sleep. Dr. Leanne Chang recently decreased her dosage of Ambien likely due to her age and history of sleep apnea. She expressed concern today. However, 5 mg does help her sleep.  Also has concerns of weight gain. Reports an approximately 16 pounds over the last 2 years. Reports walking 2-4 miles 2-3 times per week.   Review of Systems  Constitutional: Positive for unexpected weight change. Negative for appetite change.  HENT: Negative.   Respiratory: Negative.   Cardiovascular: Negative.   Gastrointestinal: Negative.   Genitourinary: Negative.   Musculoskeletal: Negative.   Skin: Negative.   Allergic/Immunologic: Negative.   Neurological: Negative.   Hematological: Negative.   Psychiatric/Behavioral: Positive for sleep disturbance. The patient is nervous/anxious.    Past Medical History  Diagnosis Date  . Hyperlipidemia   . GERD (gastroesophageal reflux disease)   . Hypothyroidism   . Diverticulosis   . Internal hemorrhoid   . Barrett esophagus 2003    Dr. Lajoyce Corners  . Sleep apnea   . Hx of colonic polyps     Dr. Lajoyce Corners   . Leukoplakia, vulva   . Anxiety   . Amnesia, global, transient 2015    see hospital notes    History   Social History  . Marital Status: Married    Spouse Name: N/A    Number of Children: 0  . 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 Smoking status: Never Smoker   .  Smokeless tobacco: Never Used  . Alcohol Use: 0.6 oz/week    1 Glasses of wine per week  . Drug Use: No  . Sexual Activity: No     Comment: BTL   Other Topics Concern  . Not on file   Social History Narrative   0 caffeine drinks     Past Surgical History  Procedure Laterality Date  . Lasik    . Breast enhancement surgery    . Dilation and curettage of uterus      x 2  . Hysteroscopy    . Tubal ligation      Family History  Problem Relation Age of Onset  . Heart disease Mother     pacemaker  . Hypertension Mother   . Depression Mother   . Diabetes Mother   . Dementia Mother   . Seizures Brother   . Lung cancer Father   . Cancer Father 37    lung  . Colon cancer Neg Hx   . Esophageal cancer Neg Hx   . Stomach cancer Neg Hx     No Known Allergies  Current Outpatient Prescriptions on File Prior to Visit  Medication Sig Dispense Refill  . atorvastatin (LIPITOR) 40 MG tablet Take 1 tablet (40 mg total) by mouth daily at 6 PM.  90 tablet  0  . esomeprazole (NEXIUM) 40 MG capsule Take 1 capsule (40 mg total) by mouth daily before breakfast.  90 capsule  3  . Estradiol-Estriol-Progesterone (BIEST/PROGESTERONE TD) Take 1 tablet by mouth 2 (two) times daily.      . famotidine (PEPCID) 20 MG tablet Take 20 mg by mouth as needed.       . furosemide (LASIX) 20 MG tablet Take 1 tablet (20 mg total) by mouth daily as needed for fluid or edema.  90 tablet  1  . naproxen sodium (ANAPROX) 220 MG tablet Take 440 mg by mouth 2 (two) times daily with a meal.       . zolpidem (AMBIEN) 10 MG tablet Take 0.5 tablets (5 mg total) by mouth at bedtime as needed for sleep.  15 tablet  3   No current facility-administered medications on file prior to visit.    BP 122/78  Pulse 85  Ht 5\' 3"  (1.6 m)  Wt 173 lb 11.2 oz (78.79 kg)  BMI 30.78 kg/m2chart    Objective:   Physical Exam  Constitutional: She is oriented to person, place, and time. She appears well-developed and  well-nourished.  HENT:  Right Ear: External ear normal.  Left Ear: External ear normal.  Nose: Nose normal.  Mouth/Throat: Oropharynx is clear and moist.  Neck: Normal range of motion. Neck supple. No thyromegaly present.  Cardiovascular: Normal rate, regular rhythm and normal heart sounds.   Pulmonary/Chest: Effort normal and breath sounds normal.  Abdominal: Soft. Bowel sounds are normal.  Musculoskeletal: Normal range of motion.  Neurological: She is alert and oriented to person, place, and time.  Skin: Skin is warm and dry.  Psychiatric: She has a normal mood and affect.          Assessment & Plan:  Megan Booth was seen today for follow-up.  Diagnoses and associated orders for this visit:  Pure hypercholesterolemia - CBC with Differential - Basic Metabolic Panel  Need for prophylactic vaccination and inoculation against influenza - Flu Vaccine QUAD 36+ mos PF IM (Fluarix Quad PF)  Depression - CBC with Differential  Gastroesophageal reflux disease with esophagitis  Insomnia - Hepatic Function Panel  Unspecified hypothyroidism - TSH - CBC with Differential - Hepatic Function Panel  Menopausal syndrome (hot flashes)  Other Orders - venlafaxine XR (EFFEXOR XR) 37.5 MG 24 hr capsule; Take 1 capsule (37.5 mg total) by mouth daily with breakfast.    Total menopausal status, DC Celexa 10 mg and start Effexor 37-1/2 mg once daily with hopes of titrating up to 75 mg. Continue current medications. Has concerns about her weight and would like to try an appetite suppressant. Labs obtained today. We'll consider Belviq. She has been on phentermine in the past that she feels no longer works for her. Recheck in 3 weeks.

## 2014-01-19 NOTE — Patient Instructions (Signed)
1. Consider Belviq    Exercise to Lose Weight Exercise and a healthy diet may help you lose weight. Your doctor may suggest specific exercises. EXERCISE IDEAS AND TIPS  Choose low-cost things you enjoy doing, such as walking, bicycling, or exercising to workout videos.  Take stairs instead of the elevator.  Walk during your lunch break.  Park your car further away from work or school.  Go to a gym or an exercise class.  Start with 5 to 10 minutes of exercise each day. Build up to 30 minutes of exercise 4 to 6 days a week.  Wear shoes with good support and comfortable clothes.  Stretch before and after working out.  Work out until you breathe harder and your heart beats faster.  Drink extra water when you exercise.  Do not do so much that you hurt yourself, feel dizzy, or get very short of breath. Exercises that burn about 150 calories:  Running 1  miles in 15 minutes.  Playing volleyball for 45 to 60 minutes.  Washing and waxing a car for 45 to 60 minutes.  Playing touch football for 45 minutes.  Walking 1  miles in 35 minutes.  Pushing a stroller 1  miles in 30 minutes.  Playing basketball for 30 minutes.  Raking leaves for 30 minutes.  Bicycling 5 miles in 30 minutes.  Walking 2 miles in 30 minutes.  Dancing for 30 minutes.  Shoveling snow for 15 minutes.  Swimming laps for 20 minutes.  Walking up stairs for 15 minutes.  Bicycling 4 miles in 15 minutes.  Gardening for 30 to 45 minutes.  Jumping rope for 15 minutes.  Washing windows or floors for 45 to 60 minutes. Document Released: 05/12/2010 Document Revised: 07/02/2011 Document Reviewed: 05/12/2010 [facility] Patient Information 2015 [place], [place]. This information is not intended to replace advice given to you by your health care provider. Make sure you discuss any questions you have with your health care provider.

## 2014-01-22 ENCOUNTER — Other Ambulatory Visit: Payer: Self-pay | Admitting: Internal Medicine

## 2014-01-22 MED ORDER — ATORVASTATIN CALCIUM 40 MG PO TABS
40.0000 mg | ORAL_TABLET | Freq: Every day | ORAL | Status: DC
Start: 2014-01-22 — End: 2014-10-24

## 2014-02-16 ENCOUNTER — Ambulatory Visit (INDEPENDENT_AMBULATORY_CARE_PROVIDER_SITE_OTHER): Payer: Medicare Other | Admitting: Family

## 2014-02-16 ENCOUNTER — Encounter: Payer: Self-pay | Admitting: Family

## 2014-02-16 VITALS — BP 110/70 | HR 81 | Wt 175.1 lb

## 2014-02-16 DIAGNOSIS — E669 Obesity, unspecified: Secondary | ICD-10-CM | POA: Diagnosis not present

## 2014-02-16 DIAGNOSIS — N951 Menopausal and female climacteric states: Secondary | ICD-10-CM

## 2014-02-16 DIAGNOSIS — F32A Depression, unspecified: Secondary | ICD-10-CM

## 2014-02-16 DIAGNOSIS — F329 Major depressive disorder, single episode, unspecified: Secondary | ICD-10-CM | POA: Diagnosis not present

## 2014-02-16 MED ORDER — LORCASERIN HCL 10 MG PO TABS: 10.0000 mg | ORAL_TABLET | Freq: Two times a day (BID) | ORAL | Status: DC

## 2014-02-16 NOTE — Progress Notes (Signed)
Pre visit review using our clinic review tool, if applicable. No additional management support is needed unless otherwise documented below in the visit note. 

## 2014-02-16 NOTE — Patient Instructions (Signed)
Exercise to Lose Weight Exercise and a healthy diet may help you lose weight. Your doctor may suggest specific exercises. EXERCISE IDEAS AND TIPS  Choose low-cost things you enjoy doing, such as walking, bicycling, or exercising to workout videos.  Take stairs instead of the elevator.  Walk during your lunch break.  Park your car further away from work or school.  Go to a gym or an exercise class.  Start with 5 to 10 minutes of exercise each day. Build up to 30 minutes of exercise 4 to 6 days a week.  Wear shoes with good support and comfortable clothes.  Stretch before and after working out.  Work out until you breathe harder and your heart beats faster.  Drink extra water when you exercise.  Do not do so much that you hurt yourself, feel dizzy, or get very short of breath. Exercises that burn about 150 calories:  Running 1  miles in 15 minutes.  Playing volleyball for 45 to 60 minutes.  Washing and waxing a car for 45 to 60 minutes.  Playing touch football for 45 minutes.  Walking 1  miles in 35 minutes.  Pushing a stroller 1  miles in 30 minutes.  Playing basketball for 30 minutes.  Raking leaves for 30 minutes.  Bicycling 5 miles in 30 minutes.  Walking 2 miles in 30 minutes.  Dancing for 30 minutes.  Shoveling snow for 15 minutes.  Swimming laps for 20 minutes.  Walking up stairs for 15 minutes.  Bicycling 4 miles in 15 minutes.  Gardening for 30 to 45 minutes.  Jumping rope for 15 minutes.  Washing windows or floors for 45 to 60 minutes. Document Released: 05/12/2010 Document Revised: 07/02/2011 Document Reviewed: 05/12/2010 [employer] Patient Information 2015 [employer]. This information is not intended to replace advice given to you by your health care provider. Make sure you discuss any questions you have with your health care provider.  

## 2014-02-16 NOTE — Progress Notes (Signed)
Subjective:    Patient ID: Megan Booth, female    DOB: [birth year], 68 y.o.   MRN: [id number]  HPI 68 year old white female, nonsmoker is in today for a recheck of Depression, Menopausal Symptoms, and Obesity. She has resumed her compound hormone replacement that has helped to decrease hot flashes. She is ready to start Belviq to assist with weight reduction. She is walking 3-4 days a week. Has taken Phentermine in the past that helped temporarily.    Review of Systems  Constitutional: Negative.   HENT: Negative.   Respiratory: Negative.   Cardiovascular: Negative.   Gastrointestinal: Negative.   Endocrine: Negative.   Genitourinary: Negative.   Musculoskeletal: Negative.   Skin: Negative.   Allergic/Immunologic: Negative.   Neurological: Negative.   Hematological: Negative.   Psychiatric/Behavioral: Negative.    Past Medical History  Diagnosis Date  . Hyperlipidemia   . GERD (gastroesophageal reflux disease)   . Hypothyroidism   . Diverticulosis   . Internal hemorrhoid   . Barrett esophagus 2003    Dr. Lajoyce Corners  . Sleep apnea   . Hx of colonic polyps     Dr. Lajoyce Corners   . Leukoplakia, vulva   . Anxiety   . Amnesia, global, transient 2015    see hospital notes    History   Social History  . Marital Status: Married    Spouse Name: N/A    Number of Children: 0  . 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 Smoking status: Never Smoker   . Smokeless tobacco: Never Used  . Alcohol Use: 0.6 oz/week    1 Glasses of wine per week  . Drug Use: No  . Sexual Activity: No     Comment: BTL   Other Topics Concern  . Not on file   Social History Narrative   0 caffeine drinks     Past Surgical History  Procedure Laterality Date  . Lasik    . Breast enhancement surgery    . Dilation and curettage of uterus      x 2  . Hysteroscopy    . Tubal ligation      Family History  Problem Relation Age of Onset    . Heart disease Mother     pacemaker  . Hypertension Mother   . Depression Mother   . Diabetes Mother   . Dementia Mother   . Seizures Brother   . Lung cancer Father   . Cancer Father 18    lung  . Colon cancer Neg Hx   . Esophageal cancer Neg Hx   . Stomach cancer Neg Hx     No Known Allergies  Current Outpatient Prescriptions on File Prior to Visit  Medication Sig Dispense Refill  . atorvastatin (LIPITOR) 40 MG tablet Take 1 tablet (40 mg total) by mouth daily at 6 PM.  90 tablet  2  . esomeprazole (NEXIUM) 40 MG capsule Take 1 capsule (40 mg total) by mouth daily before breakfast.  90 capsule  3  . Estradiol-Estriol-Progesterone (BIEST/PROGESTERONE TD) Take 1 tablet by mouth 2 (two) times daily.      . famotidine (PEPCID) 20 MG tablet Take 20 mg by mouth as needed.       . furosemide (LASIX) 20 MG tablet Take 1 tablet (20 mg total) by mouth daily as needed for fluid or edema.  90 tablet  1  . naproxen  sodium (ANAPROX) 220 MG tablet Take 440 mg by mouth 2 (two) times daily with a meal.       . venlafaxine XR (EFFEXOR XR) 37.5 MG 24 hr capsule Take 1 capsule (37.5 mg total) by mouth daily with breakfast.  30 capsule  2  . zolpidem (AMBIEN) 10 MG tablet Take 0.5 tablets (5 mg total) by mouth at bedtime as needed for sleep.  15 tablet  3   No current facility-administered medications on file prior to visit.    BP 110/70  Pulse 81  Wt 175 lb 1.6 oz (79.425 kg)chart    Objective:   Physical Exam  Constitutional: She is oriented to person, place, and time. She appears well-developed and well-nourished.  HENT:  Right Ear: External ear normal.  Left Ear: External ear normal.  Nose: Nose normal.  Mouth/Throat: Oropharynx is clear and moist.  Neck: Normal range of motion. Neck supple.  Cardiovascular: Normal rate, regular rhythm and normal heart sounds.   Pulmonary/Chest: Effort normal and breath sounds normal.  Abdominal: Soft. Bowel sounds are normal.  Musculoskeletal:  Normal range of motion.  Neurological: She is alert and oriented to person, place, and time.  Skin: Skin is warm and dry.  Psychiatric: She has a normal mood and affect.          Assessment & Plan:  Kimberley was seen today for follow-up.  Diagnoses and associated orders for this visit:  Obesity  Menopausal symptoms  Depression  Other Orders - Lorcaserin HCl (BELVIQ) 10 MG TABS; Take 10 mg by mouth 2 (two) times daily.   Recheck in 4 weeks. Call the office with any questions or concerns.

## 2014-03-16 ENCOUNTER — Ambulatory Visit: Payer: Medicare Other | Admitting: Family

## 2014-03-26 ENCOUNTER — Other Ambulatory Visit: Payer: Self-pay | Admitting: Internal Medicine

## 2014-04-25 ENCOUNTER — Telehealth: Payer: Self-pay | Admitting: Family

## 2014-04-25 DIAGNOSIS — R059 Cough, unspecified: Secondary | ICD-10-CM

## 2014-04-25 DIAGNOSIS — J069 Acute upper respiratory infection, unspecified: Secondary | ICD-10-CM

## 2014-04-25 DIAGNOSIS — R05 Cough: Secondary | ICD-10-CM

## 2014-04-25 MED ORDER — AZITHROMYCIN 250 MG PO TABS: ORAL_TABLET | ORAL | Status: DC

## 2014-04-25 MED ORDER — BENZONATATE 200 MG PO CAPS: 200.0000 mg | ORAL_CAPSULE | Freq: Three times a day (TID) | ORAL | Status: DC | PRN

## 2014-04-25 NOTE — Progress Notes (Signed)
We are sorry that you are not feeling well.  Here is how we plan to help!  Based on what you have shared with me it looks like you have upper respiratory tract inflammation that has resulted in a signification cough.  Inflammation and infection in the upper respiratory tract is commonly called bronchitis and has four common causes:  Allergies, Viral Infections, Acid Reflux and Bacterial Infections.  Allergies, viruses and acid reflux are treated by controlling symptoms or eliminating the cause. An example might be a cough caused by taking certain blood pressure medications. You stop the cough by changing the medication. Another example might be a cough caused by acid reflux. Controlling the reflux helps control the cough.  Based on your presentation I believe you most likely have A cough due to bacteria.  When patients have a fever and a productive cough with a change in color or increased sputum production, we are concerned about bacterial bronchitis.  If left untreated it can progress to pneumonia.  If your symptoms do not improve with your treatment plan it is important that you contact your provider.   I have prescribed Azithromyin 250 mg: two tables now and then one tablet daily for 4 additonal days   In addition you may use A non-prescription cough medication called Robitussin DAC. Take 2 teaspoons every 8 hours or Delsym: take 2 teaspoons every 12 hours., A non-prescription cough medication called Mucinex DM: take 2 tablets every 12 hours. and A prescription cough medication called Tessalon Perles 100mg. You may take 1-2 capsules every 8 hours as needed for your cough.    HOME CARE . Only take medications as instructed by your medical team. . Complete the entire course of an antibiotic. . Drink plenty of fluids and get plenty of rest. . Avoid close contacts especially the very young and the elderly . Cover your mouth if you cough or cough into your sleeve. . Always remember to wash your  hands . A steam or ultrasonic humidifier can help congestion.    GET HELP RIGHT AWAY IF: . You develop worsening fever. . You become short of breath . You cough up blood. . Your symptoms persist after you have completed your treatment plan MAKE SURE YOU   Understand these instructions.  Will watch your condition.  Will get help right away if you are not doing well or get worse.  Your e-visit answers were reviewed by a board certified advanced clinical practitioner to complete your personal care plan.  Depending on the condition, your plan could have included both over the counter or prescription medications.  If there is a problem please reply  once you have received a response from your provider.  Your safety is important to us.  If you have drug allergies check your prescription carefully.    You can use MyChart to ask questions about today's visit, request a non-urgent call back, or ask for a work or school excuse.  You will get an e-mail in the next two days asking about your experience.  I hope that your e-visit has been valuable and will speed your recovery. Thank you for using e-visits.   

## 2014-05-11 DIAGNOSIS — D2271 Melanocytic nevi of right lower limb, including hip: Secondary | ICD-10-CM | POA: Diagnosis not present

## 2014-05-11 DIAGNOSIS — L814 Other melanin hyperpigmentation: Secondary | ICD-10-CM | POA: Diagnosis not present

## 2014-05-11 DIAGNOSIS — D2262 Melanocytic nevi of left upper limb, including shoulder: Secondary | ICD-10-CM | POA: Diagnosis not present

## 2014-05-11 DIAGNOSIS — Z85828 Personal history of other malignant neoplasm of skin: Secondary | ICD-10-CM | POA: Diagnosis not present

## 2014-05-11 DIAGNOSIS — D225 Melanocytic nevi of trunk: Secondary | ICD-10-CM | POA: Diagnosis not present

## 2014-05-11 DIAGNOSIS — L821 Other seborrheic keratosis: Secondary | ICD-10-CM | POA: Diagnosis not present

## 2014-05-11 DIAGNOSIS — L820 Inflamed seborrheic keratosis: Secondary | ICD-10-CM | POA: Diagnosis not present

## 2014-05-17 ENCOUNTER — Encounter (HOSPITAL_COMMUNITY): Payer: Medicare Other

## 2014-05-19 DIAGNOSIS — E669 Obesity, unspecified: Secondary | ICD-10-CM | POA: Diagnosis not present

## 2014-06-18 DIAGNOSIS — E663 Overweight: Secondary | ICD-10-CM | POA: Diagnosis not present

## 2014-06-22 ENCOUNTER — Other Ambulatory Visit: Payer: Self-pay | Admitting: Family

## 2014-06-22 NOTE — Telephone Encounter (Signed)
Pt rx request for zolpidem tartrate 10mg  tab qty: 15 tab  Pharmacy: [facility], [place]

## 2014-06-22 NOTE — Telephone Encounter (Signed)
Swords pt to est with [facility]. Message routed to [facility]

## 2014-06-23 ENCOUNTER — Other Ambulatory Visit: Payer: Self-pay | Admitting: Internal Medicine

## 2014-06-23 NOTE — Telephone Encounter (Signed)
Pls advise.  

## 2014-06-23 NOTE — Telephone Encounter (Signed)
Left a message for pt to return call 

## 2014-06-23 NOTE — Telephone Encounter (Signed)
5mg  is max dose for women and for people >65. You can rx her 5mg  if she is seeing me within 6months and give her enough until then.

## 2014-06-24 MED ORDER — ZOLPIDEM TARTRATE 5 MG PO TABS: 5.0000 mg | ORAL_TABLET | Freq: Every evening | ORAL | Status: DC | PRN

## 2014-06-24 NOTE — Telephone Encounter (Signed)
Called and spoke with pt and pt is aware of the recommendations.  Pt states she will take the Ambien 5 mg tablets.  Verified pharmacy as [facility].   Rx sent to pharmacy.

## 2014-07-14 DIAGNOSIS — E663 Overweight: Secondary | ICD-10-CM | POA: Diagnosis not present

## 2014-08-11 DIAGNOSIS — E663 Overweight: Secondary | ICD-10-CM | POA: Diagnosis not present

## 2014-08-12 ENCOUNTER — Encounter: Payer: Self-pay | Admitting: Family Medicine

## 2014-08-12 ENCOUNTER — Ambulatory Visit (INDEPENDENT_AMBULATORY_CARE_PROVIDER_SITE_OTHER): Payer: Medicare Other | Admitting: Family Medicine

## 2014-08-12 VITALS — BP 108/82 | HR 94 | Temp 97.7°F | Wt 157.0 lb

## 2014-08-12 DIAGNOSIS — E538 Deficiency of other specified B group vitamins: Secondary | ICD-10-CM | POA: Diagnosis not present

## 2014-08-12 DIAGNOSIS — Z23 Encounter for immunization: Secondary | ICD-10-CM

## 2014-08-12 DIAGNOSIS — F419 Anxiety disorder, unspecified: Secondary | ICD-10-CM

## 2014-08-12 DIAGNOSIS — E785 Hyperlipidemia, unspecified: Secondary | ICD-10-CM | POA: Diagnosis not present

## 2014-08-12 DIAGNOSIS — G454 Transient global amnesia: Secondary | ICD-10-CM

## 2014-08-12 DIAGNOSIS — E039 Hypothyroidism, unspecified: Secondary | ICD-10-CM | POA: Diagnosis not present

## 2014-08-12 DIAGNOSIS — G4700 Insomnia, unspecified: Secondary | ICD-10-CM

## 2014-08-12 DIAGNOSIS — R609 Edema, unspecified: Secondary | ICD-10-CM | POA: Insufficient documentation

## 2014-08-12 DIAGNOSIS — Z79890 Hormone replacement therapy (postmenopausal): Secondary | ICD-10-CM | POA: Insufficient documentation

## 2014-08-12 LAB — COMPREHENSIVE METABOLIC PANEL
ALBUMIN: 4.2 g/dL (ref 3.5–5.2)
ALT: 21 U/L (ref 0–35)
AST: 19 U/L (ref 0–37)
Alkaline Phosphatase: 58 U/L (ref 39–117)
BUN: 20 mg/dL (ref 6–23)
CALCIUM: 9.8 mg/dL (ref 8.4–10.5)
CO2: 24 mEq/L (ref 19–32)
CREATININE: 1.19 mg/dL (ref 0.40–1.20)
Chloride: 108 mEq/L (ref 96–112)
GFR: 48.12 mL/min — ABNORMAL LOW (ref >60.00)
Glucose, Bld: 101 mg/dL — ABNORMAL HIGH (ref 70–99)
POTASSIUM: 4.5 meq/L (ref 3.5–5.1)
Sodium: 138 mEq/L (ref 135–145)
Total Bilirubin: 0.5 mg/dL (ref 0.2–1.2)
Total Protein: 7 g/dL (ref 6.0–8.3)

## 2014-08-12 LAB — LDL CHOLESTEROL, DIRECT: LDL DIRECT: 83 mg/dL

## 2014-08-12 LAB — TSH: TSH: 2.93 u[IU]/mL (ref 0.35–4.50)

## 2014-08-12 LAB — VITAMIN B12: Vitamin B-12: 339 pg/mL (ref 211–911)

## 2014-08-12 MED ORDER — ZOLPIDEM TARTRATE 10 MG PO TABS: 5.0000 mg | ORAL_TABLET | Freq: Every evening | ORAL | Status: DC | PRN

## 2014-08-12 NOTE — Assessment & Plan Note (Signed)
B12 at normal level today. Check every 1-2 years

## 2014-08-12 NOTE — Assessment & Plan Note (Signed)
S: hot flashes without HRT. Discussed benefits and risks.  A/P: Patient would like to continue, though I do not prescribe this did want to counsel of increased risks including CAD, breast cancer, stroke, PE, DVT. Does have some decreased risk of fracture.

## 2014-08-12 NOTE — Assessment & Plan Note (Signed)
S: previous mild poor control A/P: on Atorvastatin 40mg , LDL 83 today. Continue current rx as at goal.

## 2014-08-12 NOTE — Assessment & Plan Note (Signed)
S: occurred January 2015. Hospitalized at time and complete workup unrevealing. No recurrence. Has some anxiety worried may occur again.  A/P: no recurrence, continue to follow. Anxiety understandable given situation. Continue celexa

## 2014-08-12 NOTE — Progress Notes (Signed)
Garret Reddish, MD Phone: [phone]  Subjective:  Patient presents today to establish care with me as their new primary care provider. Patient was formerly a patient of Dr. Leanne Chang. Chief complaint-noted.   See problem oriented charting -down 18 lbs using isagenix diet-shake in AM, protein bar later in day. Fruits/nuts with lunch. Regular meal at dinner. Walking 2 miles 3-4 days a week.  ROS-No hair or nail changes. No heat/cold intolerance. No constipation or diarrhea. Denies shakiness or anxiety. No chest pain or shortness of breath. No SI/HI  The following were reviewed and entered/updated in epic: Past Medical History  Diagnosis Date  . Hyperlipidemia   . GERD (gastroesophageal reflux disease)   . Hypothyroidism   . Diverticulosis   . Internal hemorrhoid   . Barrett esophagus 2003    Dr. Lajoyce Corners  . Sleep apnea   . Hx of colonic polyps     Dr. Lajoyce Corners   . Leukoplakia, vulva   . Anxiety   . Amnesia, global, transient 2015    see hospital notes   Patient Active Problem List   Diagnosis Date Noted  . Amnesia, global, transient     Priority: High  . Barrett esophagus 09/05/2010    Priority: High  . Hormone replacement therapy 08/12/2014    Priority: Medium  . Insomnia 08/12/2014    Priority: Medium  . Hyperlipidemia     Priority: Medium  . Sleep apnea     Priority: Medium  . Anxiety     Priority: Medium  . Edema 08/12/2014    Priority: Low  . GERD (gastroesophageal reflux disease)     Priority: Low  . Hx of colonic polyps     Priority: Low  . Leukoplakia, vulva     Priority: Low  . B12 deficiency 08/03/2011    Priority: Low   Past Surgical History  Procedure Laterality Date  . Lasik      eye  . Breast enhancement surgery    . Dilation and curettage of uterus      x 2  . Hysteroscopy    . Tubal ligation      Family History  Problem Relation Age of Onset  . Heart disease Mother     pacemaker  . Hypertension Mother   . Depression Mother   . Diabetes  Mother   . Dementia Mother   . Seizures Brother   . Cancer Father 30    lung, smoker  . Colon cancer Neg Hx   . Esophageal cancer Neg Hx   . Stomach cancer Neg Hx     Medications- reviewed and updated Current Outpatient Prescriptions  Medication Sig Dispense Refill  . atorvastatin (LIPITOR) 40 MG tablet Take 1 tablet (40 mg total) by mouth daily at 6 PM. 90 tablet 2  . citalopram (CELEXA) 10 MG tablet TAKE 1 TABLET ONCE DAILY. 30 tablet 5  . esomeprazole (NEXIUM) 40 MG capsule Take 1 capsule (40 mg total) by mouth daily before breakfast. 90 capsule 3  . Estradiol-Estriol-Progesterone (BIEST/PROGESTERONE TD) Take 1 tablet by mouth 2 (two) times daily.    . famotidine (PEPCID) 20 MG tablet Take 20 mg by mouth as needed.     . furosemide (LASIX) 20 MG tablet Take 1 tablet (20 mg total) by mouth daily as needed for fluid or edema. (Patient not taking: Reported on 08/12/2014) 90 tablet 1  . zolpidem (AMBIEN) 10 MG tablet Take 0.5 tablets (5 mg total) by mouth at bedtime as needed for sleep. 15 tablet  5   No current facility-administered medications for this visit.   Allergies-reviewed and updated No Known Allergies  History   Social History  . Marital Status: Married    Spouse Name: N/A  . Number of Children: 0  . 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 Smoking status: Never Smoker   . Smokeless tobacco: Never Used  . Alcohol Use: 0.6 oz/week    1 Glasses of wine per week  . Drug Use: No  . Sexual Activity: No     Comment: BTL   Other Topics Concern  . None   Social History Narrative   Married (Husband with Dr. Linna Darner), no children.       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 No rebound or guarding.  Ext: no edema Skin: warm, dry, no rash  Neuro: grossly normal, moves all extremities, PERRLA   Assessment/Plan:  Amnesia, global, transient S: occurred January 2015. Hospitalized at time and complete workup unrevealing. No recurrence. Has some anxiety worried may occur again.  A/P: no recurrence, continue to follow. Anxiety understandable given situation. Continue celexa    Hypothyroidism S: history of hypothyroidism reported as borderline.  A/P: my review of chart does not substantiate this. TSH today normal. Will resolve issue.     Hyperlipidemia S: previous mild poor control A/P: on Atorvastatin 40mg , LDL 83 today. Continue current rx as at goal.     Anxiety S: no history of panic attacks or GAD. Primarily anxiety now resolved aroudn amnestic event. Controlled on celexa A/P: continue current rx.     Hormone replacement therapy S: hot flashes without HRT. Discussed benefits and risks.  A/P: Patient would like to continue, though I do not prescribe this did want to counsel of increased risks including CAD, breast cancer, stroke, PE, DVT. Does have some decreased risk of fracture.     Insomnia S: odd issue where Feels woozy on 5mg  ambien but not on 1/2 of a 10mg  pill.  A/P: I refilled the 10mg  pill and instructed specifically to take 5mg  only as she has been doing.     B12 deficiency B12 at normal level today. Check every 1-2 years   6-12 month. AWV 12 months.   Orders Placed This Encounter  Procedures  . Pneumococcal polysaccharide vaccine 23-valent greater than or equal to 2yo subcutaneous/IM  . LDL cholesterol, direct    [place]  . TSH    [place]  . Vitamin B12  . Comprehensive metabolic panel    [place]    Meds ordered this encounter  Medications  . zolpidem (AMBIEN) 10 MG tablet    Sig: Take 0.5 tablets (5 mg total)  by mouth at bedtime as needed for sleep.    Dispense:  15 tablet    Refill:  5

## 2014-08-12 NOTE — Assessment & Plan Note (Signed)
S: no history of panic attacks or GAD. Primarily anxiety now resolved aroudn amnestic event. Controlled on celexa A/P: continue current rx.

## 2014-08-12 NOTE — Assessment & Plan Note (Signed)
S: history of hypothyroidism reported as borderline.  A/P: my review of chart does not substantiate this. TSH today normal. Will resolve issue.

## 2014-08-12 NOTE — Patient Instructions (Addendum)
Received final Pneumonia shot today.  Update labs today  Cholesterol has been just a hair high-suspect with weight loss its at goal now.   Schedule 1 year annual wellness visit, happy to see you in 6 months to check in if you want as well.

## 2014-08-12 NOTE — Assessment & Plan Note (Signed)
S: odd issue where Feels woozy on 5mg  ambien but not on 1/2 of a 10mg  pill.  A/P: I refilled the 10mg  pill and instructed specifically to take 5mg  only as she has been doing.

## 2014-08-16 ENCOUNTER — Encounter: Payer: Self-pay | Admitting: Family Medicine

## 2014-09-07 DIAGNOSIS — Z6827 Body mass index (BMI) 27.0-27.9, adult: Secondary | ICD-10-CM | POA: Diagnosis not present

## 2014-09-07 DIAGNOSIS — M545 Low back pain: Secondary | ICD-10-CM | POA: Diagnosis not present

## 2014-09-07 DIAGNOSIS — L900 Lichen sclerosus et atrophicus: Secondary | ICD-10-CM | POA: Diagnosis not present

## 2014-09-07 DIAGNOSIS — N951 Menopausal and female climacteric states: Secondary | ICD-10-CM | POA: Diagnosis not present

## 2014-09-27 ENCOUNTER — Other Ambulatory Visit: Payer: Self-pay | Admitting: Internal Medicine

## 2014-10-11 ENCOUNTER — Other Ambulatory Visit: Payer: Self-pay | Admitting: Internal Medicine

## 2014-10-22 DIAGNOSIS — Z1231 Encounter for screening mammogram for malignant neoplasm of breast: Secondary | ICD-10-CM | POA: Diagnosis not present

## 2014-10-22 DIAGNOSIS — Z803 Family history of malignant neoplasm of breast: Secondary | ICD-10-CM | POA: Diagnosis not present

## 2014-10-22 LAB — HM MAMMOGRAPHY: HM MAMMO: NORMAL

## 2014-10-24 ENCOUNTER — Other Ambulatory Visit: Payer: Self-pay | Admitting: Internal Medicine

## 2014-10-26 ENCOUNTER — Encounter: Payer: Self-pay | Admitting: Family Medicine

## 2015-01-03 ENCOUNTER — Telehealth: Payer: Self-pay | Admitting: Family Medicine

## 2015-01-03 DIAGNOSIS — Z1159 Encounter for screening for other viral diseases: Secondary | ICD-10-CM

## 2015-01-03 NOTE — Telephone Encounter (Signed)
Lab visit is fine

## 2015-01-03 NOTE — Telephone Encounter (Signed)
Pt sent a mychart message and said after speaking with Dr Yong Channel she decided to have the hep c screening and flu shot. Before I email her back I would like to know if she need to see the doctor or just a lab appt.

## 2015-01-03 NOTE — Telephone Encounter (Signed)
Ok thanks will need an order please

## 2015-01-05 NOTE — Telephone Encounter (Signed)
Lab order entered.

## 2015-01-05 NOTE — Telephone Encounter (Signed)
Pt is scheduled for her hep c lab on 01/11/15 and we will need an order please

## 2015-01-11 ENCOUNTER — Other Ambulatory Visit (INDEPENDENT_AMBULATORY_CARE_PROVIDER_SITE_OTHER): Payer: Medicare Other

## 2015-01-11 DIAGNOSIS — Z1159 Encounter for screening for other viral diseases: Secondary | ICD-10-CM

## 2015-01-12 LAB — HEPATITIS C ANTIBODY: HCV Ab: NEGATIVE

## 2015-01-26 ENCOUNTER — Other Ambulatory Visit: Payer: Self-pay | Admitting: Family Medicine

## 2015-01-27 ENCOUNTER — Ambulatory Visit: Payer: Medicare Other

## 2015-02-04 ENCOUNTER — Ambulatory Visit (INDEPENDENT_AMBULATORY_CARE_PROVIDER_SITE_OTHER): Payer: Medicare Other

## 2015-02-04 DIAGNOSIS — Z23 Encounter for immunization: Secondary | ICD-10-CM | POA: Diagnosis not present

## 2015-03-06 ENCOUNTER — Other Ambulatory Visit: Payer: Self-pay | Admitting: Family Medicine

## 2015-03-26 ENCOUNTER — Encounter: Payer: Self-pay | Admitting: Family Medicine

## 2015-03-28 ENCOUNTER — Encounter: Payer: Self-pay | Admitting: Family Medicine

## 2015-03-28 ENCOUNTER — Other Ambulatory Visit: Payer: Self-pay | Admitting: Family Medicine

## 2015-03-28 NOTE — Telephone Encounter (Signed)
You can provide verbal order when this comes through [facility]- or written one I can sign for-whatever they need. Please let patietn know we got this message.

## 2015-04-11 DIAGNOSIS — M85851 Other specified disorders of bone density and structure, right thigh: Secondary | ICD-10-CM | POA: Diagnosis not present

## 2015-04-11 LAB — HM DEXA SCAN

## 2015-04-15 ENCOUNTER — Encounter: Payer: Self-pay | Admitting: Family Medicine

## 2015-04-15 DIAGNOSIS — M8580 Other specified disorders of bone density and structure, unspecified site: Secondary | ICD-10-CM | POA: Insufficient documentation

## 2015-04-24 ENCOUNTER — Encounter: Payer: Self-pay | Admitting: Family Medicine

## 2015-04-25 ENCOUNTER — Encounter: Payer: Self-pay | Admitting: Family Medicine

## 2015-04-26 ENCOUNTER — Other Ambulatory Visit: Payer: Self-pay | Admitting: Family

## 2015-04-27 ENCOUNTER — Encounter: Payer: Self-pay | Admitting: Family Medicine

## 2015-04-27 ENCOUNTER — Ambulatory Visit (INDEPENDENT_AMBULATORY_CARE_PROVIDER_SITE_OTHER): Payer: Medicare Other | Admitting: Family Medicine

## 2015-04-27 VITALS — BP 123/79 | HR 74 | Temp 98.6°F | Resp 20 | Wt 163.0 lb

## 2015-04-27 DIAGNOSIS — R059 Cough, unspecified: Secondary | ICD-10-CM

## 2015-04-27 DIAGNOSIS — J029 Acute pharyngitis, unspecified: Secondary | ICD-10-CM | POA: Diagnosis not present

## 2015-04-27 DIAGNOSIS — J0100 Acute maxillary sinusitis, unspecified: Secondary | ICD-10-CM | POA: Diagnosis not present

## 2015-04-27 DIAGNOSIS — R05 Cough: Secondary | ICD-10-CM

## 2015-04-27 LAB — POCT RAPID STREP A (OFFICE): RAPID STREP A SCREEN: NEGATIVE

## 2015-04-27 MED ORDER — AMOXICILLIN-POT CLAVULANATE 875-125 MG PO TABS: 1.0000 | ORAL_TABLET | Freq: Two times a day (BID) | ORAL | Status: DC

## 2015-04-27 MED ORDER — BENZONATATE 200 MG PO CAPS: 200.0000 mg | ORAL_CAPSULE | Freq: Three times a day (TID) | ORAL | Status: DC | PRN

## 2015-04-27 MED ORDER — FLUTICASONE PROPIONATE 50 MCG/ACT NA SUSP: 2.0000 | Freq: Every day | NASAL | Status: DC

## 2015-04-27 MED ORDER — FLUCONAZOLE 150 MG PO TABS: 150.0000 mg | ORAL_TABLET | Freq: Once | ORAL | Status: AC

## 2015-04-27 NOTE — Patient Instructions (Addendum)
Sinusitis, Adult Sinusitis is redness, soreness, and inflammation of the paranasal sinuses. Paranasal sinuses are air pockets within the bones of your face. They are located beneath your eyes, in the middle of your forehead, and above your eyes. In healthy paranasal sinuses, mucus is able to drain out, and air is able to circulate through them by way of your nose. However, when your paranasal sinuses are inflamed, mucus and air can become trapped. This can allow bacteria and other germs to grow and cause infection. Sinusitis can develop quickly and last only a short time (acute) or continue over a long period (chronic). Sinusitis that lasts for more than 12 weeks is considered chronic. CAUSES Causes of sinusitis include:  Allergies.  Structural abnormalities, such as displacement of the cartilage that separates your nostrils (deviated septum), which can decrease the air flow through your nose and sinuses and affect sinus drainage.  Functional abnormalities, such as when the small hairs (cilia) that line your sinuses and help remove mucus do not work properly or are not present. SIGNS AND SYMPTOMS Symptoms of acute and chronic sinusitis are the same. The primary symptoms are pain and pressure around the affected sinuses. Other symptoms include:  Upper toothache.  Earache.  Headache.  Bad breath.  Decreased sense of smell and taste.  A cough, which worsens when you are lying flat.  Fatigue.  Fever.  Thick drainage from your nose, which often is green and may contain pus (purulent).  Swelling and warmth over the affected sinuses. DIAGNOSIS Your health care provider will perform a physical exam. During your exam, your health care provider may perform any of the following to help determine if you have acute sinusitis or chronic sinusitis:  Look in your nose for signs of abnormal growths in your nostrils (nasal polyps).  Tap over the affected sinus to check for signs of  infection.  View the inside of your sinuses using an imaging device that has a light attached (endoscope). If your health care provider suspects that you have chronic sinusitis, one or more of the following tests may be recommended:  Allergy tests.  Nasal culture. A sample of mucus is taken from your nose, sent to a lab, and screened for bacteria.  Nasal cytology. A sample of mucus is taken from your nose and examined by your health care provider to determine if your sinusitis is related to an allergy. TREATMENT Most cases of acute sinusitis are related to a viral infection and will resolve on their own within 10 days. Sometimes, medicines are prescribed to help relieve symptoms of both acute and chronic sinusitis. These may include pain medicines, decongestants, nasal steroid sprays, or saline sprays. However, for sinusitis related to a bacterial infection, your health care provider will prescribe antibiotic medicines. These are medicines that will help kill the bacteria causing the infection. Rarely, sinusitis is caused by a fungal infection. In these cases, your health care provider will prescribe antifungal medicine. For some cases of chronic sinusitis, surgery is needed. Generally, these are cases in which sinusitis recurs more than 3 times per year, despite other treatments. HOME CARE INSTRUCTIONS  Drink plenty of water. Water helps thin the mucus so your sinuses can drain more easily.  Use a humidifier.  Inhale steam 3-4 times a day (for example, sit in the bathroom with the shower running).  Apply a warm, moist washcloth to your face 3-4 times a day, or as directed by your health care provider.  Use saline nasal sprays to help   moisten and clean your sinuses.  Take medicines only as directed by your health care provider.  If you were prescribed either an antibiotic or antifungal medicine, finish it all even if you start to feel better. SEEK IMMEDIATE MEDICAL CARE IF:  You have  increasing pain or severe headaches.  You have nausea, vomiting, or drowsiness.  You have swelling around your face.  You have vision problems.  You have a stiff neck.  You have difficulty breathing.   This information is not intended to replace advice given to you by your health care provider. Make sure you discuss any questions you have with your health care provider.   Document Released: 04/09/2005 Document Revised: 04/30/2014 Document Reviewed: 04/24/2011 Elsevier Interactive Patient Education 2016 [employer].   - Augmentin, flonase, tessalon perles.  - rest, hydrate, humidifier. Nasal saline

## 2015-04-27 NOTE — Telephone Encounter (Signed)
E-chart visit not sure what to do

## 2015-04-27 NOTE — Progress Notes (Signed)
Patient ID: Megan Booth, female   DOB: 10-21-1945, 70 y.o.   MRN: [id number]    Subjective:    Patient ID: Megan Booth  DOB: 06/26/1945, 70 y.o.    MRN: [id number]  HPI  URI: Pt states since new years Eve she has experienced a  sore throat , cough , headache, Ringing in the ears,Nasal congestion, fatigue, dizziness, rhinorrhea, hoarseness . She denies fever or chills, nausea, vomit. She states she been in bed for over a day trying to rest. She states her throat seems to be improving.  OTC: hydrate, tea with lemon. Tylenol, halls cough drops, cough syrup, old tessalon perles.  No asthma or COPD history.   Past Medical History  Diagnosis Date  . Hyperlipidemia   . GERD (gastroesophageal reflux disease)   . Hypothyroidism   . Diverticulosis   . Internal hemorrhoid   . Barrett esophagus 2003    Dr. Lajoyce Corners  . Sleep apnea   . Hx of colonic polyps     Dr. Lajoyce Corners   . Leukoplakia, vulva   . Anxiety   . Amnesia, global, transient 2015    see hospital notes   No Known Allergies Past Surgical History  Procedure Laterality Date  . Lasik      eye  . Breast enhancement surgery    . Dilation and curettage of uterus      x 2  . Hysteroscopy    . Tubal ligation     Social History  Substance Use Topics  . Smoking status: Never Smoker   . Smokeless tobacco: Never Used  . Alcohol Use: 0.6 oz/week    1 Glasses of wine per week    Review of Systems Negative, with the exception of above mentioned in HPI     Objective:   Physical Exam BP 123/79 mmHg  Pulse 74  Temp(Src) 98.6 F (37 C)  Resp 20  Wt 163 lb (73.936 kg)  SpO2 97% Body mass index is 28.88 kg/(m^2). Gen: Afebrile. No acute distress. Nontoxic in appearance. Well developed, well nourished, pleasant caucasian female.  HENT: AT. [place]. Bilateral TM visualized, full/no erythema or bulging. MMM, no oral lesions. Bilateral nares with erythema and swelling. Throat no erythema, No exudates. Cough present, Hoarseness present, TTP  max sinus. Eyes:Pupils Equal Round Reactive to light, Extraocular movements intact,  Conjunctiva without redness, discharge or icterus. Neck/lymp/endocrine: Supple, shotty  lymphadenopathy CV: RRR  Chest: CTAB, no wheeze or crackles. NL resp effort. Good air movement.  Abd: Soft. NTND. BS present.     Assessment & Plan:  Megan Booth is a 70 y.o. present for acute OV with maxillary sinusitis.  1. Sore throat - POCT rapid strep A--> negative.   2. Cough - benzonatate (TESSALON) 200 MG capsule; Take 1 capsule (200 mg total) by mouth 3 (three) times daily as needed.  Dispense: 30 capsule; Refill: 0  3. Acute maxillary sinusitis, recurrence not specified - Rest, hydrate, abx, tessalon perles, flonase, nasal saline. - start using humidifier nightly.  - amoxicillin-clavulanate (AUGMENTIN) 875-125 MG tablet; Take 1 tablet by mouth 2 (two) times daily.  Dispense: 20 tablet; Refill: 0 - fluticasone (FLONASE) 50 MCG/ACT nasal spray; Place 2 sprays into both nostrils daily.  Dispense: 16 g; Refill: 0 F/u PRN

## 2015-06-13 ENCOUNTER — Telehealth: Payer: Self-pay

## 2015-06-13 MED ORDER — ZOLPIDEM TARTRATE 10 MG PO TABS: ORAL_TABLET | ORAL | Status: DC

## 2015-06-13 NOTE — Telephone Encounter (Signed)
Yes thanks, remind her to only take 5mg . She should still be seen at least yearly so make sure she schedules this. Finally, difficult to assess the ant crawling feeling over phone. Make sure no arm weakness or numbness. If this develops or worsening symptoms happy to see her

## 2015-06-13 NOTE — Telephone Encounter (Signed)
Pt notified of refill and transferred to [facility] to schedule OV.

## 2015-06-13 NOTE — Telephone Encounter (Signed)
Pt called stating that she needs her Ambien refilled. Ok to refill? Pt also states she has a sensation on the left side of her neck that has been going on for about a month now that feels like ants crawling up her neck. Pt wanted to know if that is something she should be concerned about.

## 2015-06-16 ENCOUNTER — Telehealth: Payer: Self-pay | Admitting: Family Medicine

## 2015-06-16 NOTE — Telephone Encounter (Signed)
Fax received on 06/15/15 from [facility] stating prior authorization for patient's generic Ambien 10mg  is approved from 04/15/15 -06/13/16.  Attempted to contact patient, no answer.

## 2015-06-17 DIAGNOSIS — Z85828 Personal history of other malignant neoplasm of skin: Secondary | ICD-10-CM | POA: Diagnosis not present

## 2015-06-17 DIAGNOSIS — D1801 Hemangioma of skin and subcutaneous tissue: Secondary | ICD-10-CM | POA: Diagnosis not present

## 2015-06-17 DIAGNOSIS — D2271 Melanocytic nevi of right lower limb, including hip: Secondary | ICD-10-CM | POA: Diagnosis not present

## 2015-06-17 DIAGNOSIS — L821 Other seborrheic keratosis: Secondary | ICD-10-CM | POA: Diagnosis not present

## 2015-06-17 DIAGNOSIS — D225 Melanocytic nevi of trunk: Secondary | ICD-10-CM | POA: Diagnosis not present

## 2015-07-12 ENCOUNTER — Encounter: Payer: Self-pay | Admitting: Family Medicine

## 2015-07-12 ENCOUNTER — Ambulatory Visit (INDEPENDENT_AMBULATORY_CARE_PROVIDER_SITE_OTHER): Payer: Medicare Other | Admitting: Family Medicine

## 2015-07-12 VITALS — BP 132/68 | HR 77 | Temp 98.8°F | Wt 170.0 lb

## 2015-07-12 DIAGNOSIS — H9191 Unspecified hearing loss, right ear: Secondary | ICD-10-CM

## 2015-07-12 DIAGNOSIS — H6091 Unspecified otitis externa, right ear: Secondary | ICD-10-CM

## 2015-07-12 DIAGNOSIS — H9201 Otalgia, right ear: Secondary | ICD-10-CM | POA: Diagnosis not present

## 2015-07-12 DIAGNOSIS — H9311 Tinnitus, right ear: Secondary | ICD-10-CM | POA: Diagnosis not present

## 2015-07-12 DIAGNOSIS — H6121 Impacted cerumen, right ear: Secondary | ICD-10-CM | POA: Diagnosis not present

## 2015-07-12 MED ORDER — NEOMYCIN-POLYMYXIN-HC 3.5-10000-1 OT SUSP: 4.0000 [drp] | Freq: Four times a day (QID) | OTIC | Status: DC

## 2015-07-12 NOTE — Patient Instructions (Signed)
Use ear drops 4x a day. Allow medicine to sit in ear for about 30 seconds and then drain. Use for full 7 days. At end of 7 days- if improving but not fully resolved you can continue until bottle runs out but no more than 14 days total. Return to care if worsening symptoms or not improving by Thursday compared to before visit  Otitis Externa Otitis externa is a bacterial or fungal infection of the outer ear canal. This is the area from the eardrum to the outside of the ear. Otitis externa is sometimes called "swimmer's ear." CAUSES  Possible causes of infection include:  Swimming in dirty water.  Moisture remaining in the ear after swimming or bathing.  Mild injury (trauma) to the ear.  Objects stuck in the ear (foreign body).  Cuts or scrapes (abrasions) on the outside of the ear. SIGNS AND SYMPTOMS  The first symptom of infection is often itching in the ear canal. Later signs and symptoms may include swelling and redness of the ear canal, ear pain, and yellowish-white fluid (pus) coming from the ear. The ear pain may be worse when pulling on the earlobe. DIAGNOSIS  Your health care provider will perform a physical exam. A sample of fluid may be taken from the ear and examined for bacteria or fungi. TREATMENT  Antibiotic ear drops are often given for 10 to 14 days. Treatment may also include pain medicine or corticosteroids to reduce itching and swelling. HOME CARE INSTRUCTIONS   Apply antibiotic ear drops to the ear canal as prescribed by your health care provider.  Take medicines only as directed by your health care provider.  If you have diabetes, follow any additional treatment instructions from your health care provider.  Keep all follow-up visits as directed by your health care provider. PREVENTION   Keep your ear dry. Use the corner of a towel to absorb water out of the ear canal after swimming or bathing.  Avoid scratching or putting objects inside your ear. This can damage  the ear canal or remove the protective wax that lines the canal. This makes it easier for bacteria and fungi to grow.  Avoid swimming in lakes, polluted water, or poorly chlorinated pools.  You may use ear drops made of rubbing alcohol and vinegar after swimming. Combine equal parts of white vinegar and alcohol in a bottle. Put 3 or 4 drops into each ear after swimming. SEEK MEDICAL CARE IF:   You have a fever.  Your ear is still red, swollen, painful, or draining pus after 3 days.  Your redness, swelling, or pain gets worse.  You have a severe headache.  You have redness, swelling, pain, or tenderness in the area behind your ear. MAKE SURE YOU:   Understand these instructions.  Will watch your condition.  Will get help right away if you are not doing well or get worse.   This information is not intended to replace advice given to you by your health care provider. Make sure you discuss any questions you have with your health care provider.   Document Released: 04/09/2005 Document Revised: 04/30/2014 Document Reviewed: 04/26/2011 Elsevier Interactive Patient Education [employer].

## 2015-07-12 NOTE — Progress Notes (Signed)
Garret Reddish, MD  Subjective:  Megan Booth is a 70 y.o. year old very pleasant female patient who presents for/with See problem oriented charting ROS- see ROS below  Past Medical History-  Patient Active Problem List   Diagnosis Date Noted  . Amnesia, global, transient     Priority: High  . Barrett esophagus 09/05/2010    Priority: High  . Osteopenia 04/15/2015    Priority: Medium  . Hormone replacement therapy 08/12/2014    Priority: Medium  . Insomnia 08/12/2014    Priority: Medium  . Hyperlipidemia     Priority: Medium  . Sleep apnea     Priority: Medium  . Anxiety     Priority: Medium  . Edema 08/12/2014    Priority: Low  . GERD (gastroesophageal reflux disease)     Priority: Low  . Hx of colonic polyps     Priority: Low  . Leukoplakia, vulva     Priority: Low  . B12 deficiency 08/03/2011    Priority: Low  . Maxillary sinusitis, acute 04/27/2015    Medications- reviewed and updated Current Outpatient Prescriptions  Medication Sig Dispense Refill  . atorvastatin (LIPITOR) 40 MG tablet TAKE 1 TABLET ONCE DAILY AT 6 PM. 90 tablet 3  . citalopram (CELEXA) 10 MG tablet TAKE 1 TABLET ONCE DAILY. 30 tablet 6  . esomeprazole (NEXIUM) 40 MG capsule TAKE 1 CAPSULE ONCE DAILY BEFORE BREAKFAST. 90 capsule 1  . Estradiol-Estriol-Progesterone (BIEST/PROGESTERONE TD) Take 1 tablet by mouth 2 (two) times daily.    . famotidine (PEPCID) 20 MG tablet Take 20 mg by mouth as needed.     . furosemide (LASIX) 20 MG tablet Take 1 tablet (20 mg total) by mouth daily as needed for fluid or edema. 90 tablet 1  . nystatin-triamcinolone (MYCOLOG II) cream     . topiramate (TOPAMAX) 100 MG tablet     . benzonatate (TESSALON) 200 MG capsule Take 1 capsule (200 mg total) by mouth 3 (three) times daily as needed. (Patient not taking: Reported on 07/12/2015) 30 capsule 0  . fluticasone (FLONASE) 50 MCG/ACT nasal spray Place 2 sprays into both nostrils daily. (Patient not taking: Reported  on 07/12/2015) 16 g 0  . neomycin-polymyxin-hydrocortisone (CORTISPORIN) 3.5-10000-1 otic suspension Place 4 drops into the right ear 4 (four) times daily. 10 mL 0  . zolpidem (AMBIEN) 10 MG tablet TAKE 1/2 TABLET AT BEDTIME AS NEEDED FOR SLEEP. (Patient not taking: Reported on 07/12/2015) 15 tablet 2   No current facility-administered medications for this visit.    Objective: BP 132/68 mmHg  Pulse 77  Temp(Src) 98.8 F (37.1 C)  Wt 170 lb (77.111 kg) Gen: NAD, resting comfortably TM normal on left, on right obscured by cerumen. After irrigation, canal is swollen and erythema is noted.  CV: RRR no murmurs rubs or gallops Lungs: CTAB no crackles, wheeze, rhonchi Ext: no edema Skin: warm, dry Neuro: grossly normal, moves all extremities, normal gait, stable with standing  Assessment/Plan:  Right ear pain, hearing loss, tinnitus S: Patient noted on Saturday that she was having some fullness in right ear. She tried some sweet oil and cotton ball- symptoms progressed. Developed hearing loss and ringing in her right ear with worsening pain. Pain rated a moderate ache. This has never happened to her before Ros- no vertigo, no fever, chills, nausea, vomiting, no headache A/P: Cerumen impaction s/p irrigation by [place]. Hearing loss and tinnitus resolved but pain remained. Otitis externa on exam. Treat with cortisporin for  7 days. Keep ear dry. Follow up precautions given. We discussed if she is not noting improvement by Thursday to consider reeavluation or ENT referral.   Return precautions advised.   Meds ordered this encounter  Medications  . neomycin-polymyxin-hydrocortisone (CORTISPORIN) 3.5-10000-1 otic suspension    Sig: Place 4 drops into the right ear 4 (four) times daily.    Dispense:  10 mL    Refill:  0

## 2015-07-25 DIAGNOSIS — M7701 Medial epicondylitis, right elbow: Secondary | ICD-10-CM | POA: Diagnosis not present

## 2015-08-09 ENCOUNTER — Other Ambulatory Visit (INDEPENDENT_AMBULATORY_CARE_PROVIDER_SITE_OTHER): Payer: Medicare Other

## 2015-08-09 DIAGNOSIS — Z0000 Encounter for general adult medical examination without abnormal findings: Secondary | ICD-10-CM | POA: Diagnosis not present

## 2015-08-09 DIAGNOSIS — R7989 Other specified abnormal findings of blood chemistry: Secondary | ICD-10-CM

## 2015-08-09 LAB — CBC WITH DIFFERENTIAL/PLATELET
BASOS ABS: 0.1 10*3/uL (ref 0.0–0.1)
Basophils Relative: 0.6 % (ref 0.0–3.0)
EOS ABS: 0.3 10*3/uL (ref 0.0–0.7)
Eosinophils Relative: 2.9 % (ref 0.0–5.0)
HCT: 40.6 % (ref 36.0–46.0)
Hemoglobin: 13.9 g/dL (ref 12.0–15.0)
LYMPHS ABS: 3.1 10*3/uL (ref 0.7–4.0)
Lymphocytes Relative: 35 % (ref 12.0–46.0)
MCHC: 34.1 g/dL (ref 30.0–36.0)
MCV: 92.7 fl (ref 78.0–100.0)
Monocytes Absolute: 0.6 10*3/uL (ref 0.1–1.0)
Monocytes Relative: 7.2 % (ref 3.0–12.0)
NEUTROS ABS: 4.8 10*3/uL (ref 1.4–7.7)
NEUTROS PCT: 54.3 % (ref 43.0–77.0)
PLATELETS: 224 10*3/uL (ref 150.0–400.0)
RBC: 4.38 Mil/uL (ref 3.87–5.11)
RDW: 13.3 % (ref 11.5–15.5)
WBC: 8.9 10*3/uL (ref 4.0–10.5)

## 2015-08-09 LAB — BASIC METABOLIC PANEL
BUN: 17 mg/dL (ref 6–23)
CALCIUM: 8.8 mg/dL (ref 8.4–10.5)
CO2: 28 meq/L (ref 19–32)
CREATININE: 1.05 mg/dL (ref 0.40–1.20)
Chloride: 107 mEq/L (ref 96–112)
GFR: 55.43 mL/min — ABNORMAL LOW (ref >60.00)
GLUCOSE: 94 mg/dL (ref 70–99)
Potassium: 4.1 mEq/L (ref 3.5–5.1)
Sodium: 140 mEq/L (ref 135–145)

## 2015-08-09 LAB — HEPATIC FUNCTION PANEL
ALK PHOS: 55 U/L (ref 39–117)
ALT: 21 U/L (ref 0–35)
AST: 14 U/L (ref 0–37)
Albumin: 3.8 g/dL (ref 3.5–5.2)
BILIRUBIN DIRECT: 0.1 mg/dL (ref 0.0–0.3)
Total Bilirubin: 0.4 mg/dL (ref 0.2–1.2)
Total Protein: 6.5 g/dL (ref 6.0–8.3)

## 2015-08-09 LAB — URINALYSIS, MICROSCOPIC ONLY: RBC / HPF: NONE SEEN (ref 0–?)

## 2015-08-09 LAB — POC URINALSYSI DIPSTICK (AUTOMATED)
BILIRUBIN UA: NEGATIVE
GLUCOSE UA: NEGATIVE
Ketones, UA: NEGATIVE
Leukocytes, UA: NEGATIVE
Nitrite, UA: NEGATIVE
PH UA: 5.5
Protein, UA: NEGATIVE
SPEC GRAV UA: 1.02
UROBILINOGEN UA: 0.2

## 2015-08-09 LAB — LIPID PANEL
CHOL/HDL RATIO: 4
Cholesterol: 203 mg/dL — ABNORMAL HIGH (ref 0–200)
HDL: 47.6 mg/dL (ref >39.00)
NONHDL: 154.92
TRIGLYCERIDES: 271 mg/dL — AB (ref 0.0–149.0)
VLDL: 54.2 mg/dL — ABNORMAL HIGH (ref 0.0–40.0)

## 2015-08-09 LAB — TSH: TSH: 7.05 u[IU]/mL — AB (ref 0.35–4.50)

## 2015-08-09 LAB — LDL CHOLESTEROL, DIRECT: Direct LDL: 112 mg/dL

## 2015-08-15 ENCOUNTER — Ambulatory Visit (INDEPENDENT_AMBULATORY_CARE_PROVIDER_SITE_OTHER): Payer: Medicare Other | Admitting: Family Medicine

## 2015-08-15 ENCOUNTER — Encounter: Payer: Self-pay | Admitting: Family Medicine

## 2015-08-15 VITALS — BP 128/64 | HR 74 | Temp 98.2°F | Ht 63.0 in | Wt 174.0 lb

## 2015-08-15 DIAGNOSIS — R946 Abnormal results of thyroid function studies: Secondary | ICD-10-CM

## 2015-08-15 DIAGNOSIS — Z0001 Encounter for general adult medical examination with abnormal findings: Secondary | ICD-10-CM

## 2015-08-15 DIAGNOSIS — R6889 Other general symptoms and signs: Secondary | ICD-10-CM | POA: Diagnosis not present

## 2015-08-15 DIAGNOSIS — R7989 Other specified abnormal findings of blood chemistry: Secondary | ICD-10-CM

## 2015-08-15 DIAGNOSIS — E785 Hyperlipidemia, unspecified: Secondary | ICD-10-CM

## 2015-08-15 MED ORDER — CITALOPRAM HYDROBROMIDE 10 MG PO TABS: 10.0000 mg | ORAL_TABLET | Freq: Every day | ORAL | Status: DC

## 2015-08-15 MED ORDER — ESOMEPRAZOLE MAGNESIUM 40 MG PO CPDR: DELAYED_RELEASE_CAPSULE | ORAL | Status: DC

## 2015-08-15 MED ORDER — FUROSEMIDE 20 MG PO TABS: 20.0000 mg | ORAL_TABLET | Freq: Every day | ORAL | Status: DC | PRN

## 2015-08-15 MED ORDER — ATORVASTATIN CALCIUM 40 MG PO TABS: ORAL_TABLET | ORAL | Status: DC

## 2015-08-15 MED ORDER — ZOLPIDEM TARTRATE 10 MG PO TABS: ORAL_TABLET | ORAL | Status: DC

## 2015-08-15 NOTE — Assessment & Plan Note (Signed)
S: worsened control despite atorvastatin 40mg  (last LDL was 83). No myalgias.  Lab Results  Component Value Date   CHOL 203* 08/09/2015   HDL 47.60 08/09/2015   LDLCALC 101* 09/15/2013   LDLDIRECT 112.0 08/09/2015   TRIG 271.0* 08/09/2015   CHOLHDL 4 08/09/2015   A/P: wonder if this could be related to thyroid, repeat TSH and T4 in 2 weeks.

## 2015-08-15 NOTE — Patient Instructions (Addendum)
  Megan Booth , Thank you for taking time to come for your Medicare Wellness Visit. I appreciate your ongoing commitment to your health goals. Please review the following plan we discussed and let me know if I can assist you in the future.   These are the goals we discussed: 1. Start vitamin D [phone] units a day 2. Return for repeat thyroid testing in 2 weeks, I will include an additional test as well (t4). Schedule this at check out 3. I think your cholesterol numbers and weight may be caused by the thyroid at least partially- that's why I want to follow up so closely.    This is a list of the screening recommended for you and due dates:  Health Maintenance  Topic Date Due  . Flu Shot  11/22/2015  . Tetanus Vaccine  05/28/2016  . Mammogram  10/21/2016  . Colon Cancer Screening  09/03/2019  . DEXA scan (bone density measurement)  Completed  . Shingles Vaccine  Completed  .  Hepatitis C: One time screening is recommended by Center for Disease Control  (CDC) for  adults born from [birth year] through 1963.   Completed  . Pneumonia vaccines  Completed

## 2015-08-15 NOTE — Progress Notes (Signed)
Phone: [phone]  Subjective:  Patient presents today for their annual wellness visit.    Preventive Screening-Counseling & Management  Smoking Status: Never Smoker Second Hand Smoking status: No smokers in home  Risk Factors Regular exercise: recently started 1.5 miles working up to 5 miles doing 4 days a week Diet: some weight gain despite stable food intake. Was on prednisone some.   Fall Risk: None   Cardiac risk factors:  advanced age (older than 28 for men, 78 for women)  Hyperlipidemia - high this visit but on atorvastatin 40mg  and could be thyroid related Lab Results  Component Value Date   CHOL 203* 08/09/2015   HDL 47.60 08/09/2015   LDLCALC 101* 09/15/2013   LDLDIRECT 112.0 08/09/2015   TRIG 271.0* 08/09/2015   CHOLHDL 4 08/09/2015  No hypertension No diabetes.  Family History: none in immediate family CAD or CVA  Depression Screen None. PHQ2 0. Also on celexa  Activities of Daily Living Independent ADLs and IADLs   Hearing Difficulties: -patient declines  Cognitive Testing No reported trouble.   Normal 3 word recall   List the Names of Other Physician/Practitioners you currently use: -Dr. Olevia Perches of Gi in past -[facility] -Also sees optho- will bring list to next years visit - Dr. Cay Schillings [facility].   Immunization History  Administered Date(s) Administered  . Influenza Split 01/25/2012  . Influenza, High Dose Seasonal PF 02/04/2015  . Influenza,inj,Quad PF,36+ Mos 01/19/2014  . Influenza-Unspecified 02/21/2013  . Pneumococcal Conjugate-13 04/14/2013  . Pneumococcal Polysaccharide-23 08/12/2014  . Td 05/28/2006  . Zoster 09/21/2011   Required Immunizations needed today  Immunization History  Administered Date(s) Administered  . Influenza Split 01/25/2012  . Influenza, High Dose Seasonal PF 02/04/2015  . Influenza,inj,Quad PF,36+ Mos 01/19/2014  . Influenza-Unspecified 02/21/2013  . Pneumococcal Conjugate-13  04/14/2013  . Pneumococcal Polysaccharide-23 08/12/2014  . Td 05/28/2006  . Zoster 09/21/2011   Screening tests- up to date, bone density done recently  ROS- No pertinent positives discovered in course of AWV Pertinent- does have some weight gain, constipation, thinning fingernails No chest pain or shortness of breath. No headache or blurry vision.   The following were reviewed and entered/updated in epic: Past Medical History  Diagnosis Date  . Hyperlipidemia   . GERD (gastroesophageal reflux disease)   . Hypothyroidism   . Diverticulosis   . Internal hemorrhoid   . Barrett esophagus 2003    Dr. Lajoyce Corners  . Sleep apnea   . Hx of colonic polyps     Dr. Lajoyce Corners   . Leukoplakia, vulva   . Anxiety   . Amnesia, global, transient 2015    see hospital notes   Patient Active Problem List   Diagnosis Date Noted  . Amnesia, global, transient     Priority: High  . Barrett esophagus 09/05/2010    Priority: High  . Osteopenia 04/15/2015    Priority: Medium  . Hormone replacement therapy 08/12/2014    Priority: Medium  . Insomnia 08/12/2014    Priority: Medium  . Hyperlipidemia     Priority: Medium  . Sleep apnea     Priority: Medium  . Anxiety     Priority: Medium  . Edema 08/12/2014    Priority: Low  . GERD (gastroesophageal reflux disease)     Priority: Low  . Hx of colonic polyps     Priority: Low  . Leukoplakia, vulva     Priority: Low  . B12 deficiency 08/03/2011    Priority: Low  .  Maxillary sinusitis, acute 04/27/2015   Past Surgical History  Procedure Laterality Date  . Lasik      eye  . Breast enhancement surgery    . Dilation and curettage of uterus      x 2  . Hysteroscopy    . Tubal ligation      Family History  Problem Relation Age of Onset  . Heart disease Mother     pacemaker  . Hypertension Mother   . Depression Mother   . Diabetes Mother   . Dementia Mother   . Seizures Brother   . Cancer Father 69    lung, smoker  . Colon cancer Neg Hx     . Esophageal cancer Neg Hx   . Stomach cancer Neg Hx     Medications- reviewed and updated Current Outpatient Prescriptions  Medication Sig Dispense Refill  . atorvastatin (LIPITOR) 40 MG tablet TAKE 1 TABLET ONCE DAILY AT 6 PM. 90 tablet 3  . citalopram (CELEXA) 10 MG tablet TAKE 1 TABLET ONCE DAILY. 30 tablet 6  . esomeprazole (NEXIUM) 40 MG capsule TAKE 1 CAPSULE ONCE DAILY BEFORE BREAKFAST. 90 capsule 1  . Estradiol-Estriol-Progesterone (BIEST/PROGESTERONE TD) Take 1 tablet by mouth 2 (two) times daily.    . famotidine (PEPCID) 20 MG tablet Take 20 mg by mouth as needed.     . furosemide (LASIX) 20 MG tablet Take 1 tablet (20 mg total) by mouth daily as needed for fluid or edema. 90 tablet 1  . nystatin-triamcinolone (MYCOLOG II) cream     . zolpidem (AMBIEN) 10 MG tablet TAKE 1/2 TABLET AT BEDTIME AS NEEDED FOR SLEEP. (Patient not taking: Reported on 07/12/2015) 15 tablet 2   Allergies-reviewed and updated No Known Allergies  Social History   Social History  . Marital Status: Married    Spouse Name: N/A  . Number of Children: 0  . 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 Smoking status: Never Smoker   . Smokeless tobacco: Never Used  . Alcohol Use: 0.6 oz/week    1 Glasses of wine per week  . Drug Use: No  . Sexual Activity: No     Comment: BTL   Other Topics Concern  . None   Social History Narrative   Married (Husband with Dr. Linna Darner), no children.       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 Oropharynx normal Neck: no thyromegaly CV: RRR no murmurs rubs or gallops Lungs: CTAB no crackles, wheeze, rhonchi Abdomen: soft/nontender/nondistended/normal  bowel sounds. No rebound or guarding.  Ext: no edema Skin: warm, dry, no rash Neuro: grossly normal, moves all extremities, PERRLA  Assessment/Plan:  AWV completed- discussed recommended screenings anddocumented any personalized health advice and referrals for preventive counseling. See AVS as well which was given to patient.   Elevated TSH - Plan: TSH, T4, free  Hyperlipidemia S: despite no significant changes in diet has been gaining weight. Has started walking 4 days a week recently. She does have some constipation as well as some thinning nails. No cold intolerance or changes to hair Wt Readings from Last 3 Encounters:  08/15/15 174 lb (78.926 kg)  07/12/15 170 lb (77.111 kg)  04/27/15 163 lb (73.936 kg)  A/P: repeat TSH and T4 in a few weeks   Hyperlipidemia S: worsened control despite atorvastatin 40mg  (last LDL was 83). No myalgias.  Lab Results  Component Value Date   CHOL 203* 08/09/2015   HDL 47.60 08/09/2015   LDLCALC 101* 09/15/2013   LDLDIRECT 112.0 08/09/2015   TRIG 271.0* 08/09/2015   CHOLHDL 4 08/09/2015   A/P: wonder if this could be related to thyroid, repeat TSH and T4 in 2 weeks.      Return in about 1 year (around 08/14/2016) for annual wellness visit. Return precautions advised.   Orders Placed This Encounter  Procedures  . TSH    [place]    Standing Status: Future     Number of Occurrences:      Standing Expiration Date: 08/14/2016  . T4, free    [place]    Standing Status: Future     Number of Occurrences:      Standing Expiration Date: 08/14/2016    Meds ordered this encounter  Medications  . esomeprazole (NEXIUM) 40 MG capsule    Sig: TAKE 1 CAPSULE ONCE DAILY BEFORE BREAKFAST.    Dispense:  90 capsule    Refill:  3  . citalopram (CELEXA) 10 MG tablet    Sig: Take 1 tablet (10 mg total) by mouth daily.    Dispense:  90 tablet    Refill:  3  . atorvastatin (LIPITOR) 40 MG tablet    Sig: TAKE 1 TABLET ONCE DAILY AT 6 PM.     Dispense:  90 tablet    Refill:  3  . furosemide (LASIX) 20 MG tablet    Sig: Take 1 tablet (20 mg total) by mouth daily as needed for fluid or edema.    Dispense:  90 tablet    Refill:  1  . zolpidem (AMBIEN) 10 MG tablet    Sig: TAKE 1/2 TABLET AT BEDTIME AS NEEDED FOR SLEEP.    Dispense:  15 tablet    Refill:  5    Garret Reddish, MD

## 2015-08-29 ENCOUNTER — Other Ambulatory Visit (INDEPENDENT_AMBULATORY_CARE_PROVIDER_SITE_OTHER): Payer: Medicare Other

## 2015-08-29 ENCOUNTER — Other Ambulatory Visit: Payer: Self-pay | Admitting: Family Medicine

## 2015-08-29 DIAGNOSIS — R946 Abnormal results of thyroid function studies: Secondary | ICD-10-CM | POA: Diagnosis not present

## 2015-08-29 DIAGNOSIS — R7989 Other specified abnormal findings of blood chemistry: Secondary | ICD-10-CM

## 2015-08-29 LAB — T4, FREE: Free T4: 0.53 ng/dL — ABNORMAL LOW (ref 0.60–1.60)

## 2015-08-29 LAB — TSH: TSH: 6.41 u[IU]/mL — ABNORMAL HIGH (ref 0.35–4.50)

## 2015-09-02 DIAGNOSIS — E669 Obesity, unspecified: Secondary | ICD-10-CM | POA: Diagnosis not present

## 2015-09-21 DIAGNOSIS — N898 Other specified noninflammatory disorders of vagina: Secondary | ICD-10-CM | POA: Diagnosis not present

## 2015-09-21 DIAGNOSIS — N951 Menopausal and female climacteric states: Secondary | ICD-10-CM | POA: Diagnosis not present

## 2015-09-21 DIAGNOSIS — Z01419 Encounter for gynecological examination (general) (routine) without abnormal findings: Secondary | ICD-10-CM | POA: Diagnosis not present

## 2015-09-23 ENCOUNTER — Encounter: Payer: Self-pay | Admitting: Family Medicine

## 2015-09-23 MED ORDER — LEVOTHYROXINE SODIUM 50 MCG PO TABS: 50.0000 ug | ORAL_TABLET | Freq: Every day | ORAL | Status: DC

## 2015-10-24 DIAGNOSIS — Z1231 Encounter for screening mammogram for malignant neoplasm of breast: Secondary | ICD-10-CM | POA: Diagnosis not present

## 2015-10-24 LAB — HM MAMMOGRAPHY

## 2015-10-27 ENCOUNTER — Encounter: Payer: Self-pay | Admitting: Family Medicine

## 2015-11-03 DIAGNOSIS — E669 Obesity, unspecified: Secondary | ICD-10-CM | POA: Diagnosis not present

## 2015-11-04 ENCOUNTER — Encounter: Payer: Self-pay | Admitting: Family Medicine

## 2015-11-16 ENCOUNTER — Other Ambulatory Visit (INDEPENDENT_AMBULATORY_CARE_PROVIDER_SITE_OTHER): Payer: Medicare Other

## 2015-11-16 DIAGNOSIS — R7989 Other specified abnormal findings of blood chemistry: Secondary | ICD-10-CM | POA: Diagnosis not present

## 2015-11-16 DIAGNOSIS — R946 Abnormal results of thyroid function studies: Secondary | ICD-10-CM

## 2015-11-16 DIAGNOSIS — Z0000 Encounter for general adult medical examination without abnormal findings: Secondary | ICD-10-CM | POA: Diagnosis not present

## 2015-11-16 LAB — CBC WITH DIFFERENTIAL/PLATELET
BASOS ABS: 0 10*3/uL (ref 0.0–0.1)
BASOS PCT: 0.4 % (ref 0.0–3.0)
Eosinophils Absolute: 0.1 10*3/uL (ref 0.0–0.7)
Eosinophils Relative: 1.6 % (ref 0.0–5.0)
HCT: 40.5 % (ref 36.0–46.0)
Hemoglobin: 13.5 g/dL (ref 12.0–15.0)
LYMPHS ABS: 2.7 10*3/uL (ref 0.7–4.0)
Lymphocytes Relative: 40.5 % (ref 12.0–46.0)
MCHC: 33.4 g/dL (ref 30.0–36.0)
MCV: 93.7 fl (ref 78.0–100.0)
MONOS PCT: 6.1 % (ref 3.0–12.0)
Monocytes Absolute: 0.4 10*3/uL (ref 0.1–1.0)
NEUTROS ABS: 3.4 10*3/uL (ref 1.4–7.7)
Neutrophils Relative %: 51.4 % (ref 43.0–77.0)
PLATELETS: 265 10*3/uL (ref 150.0–400.0)
RBC: 4.33 Mil/uL (ref 3.87–5.11)
RDW: 12.8 % (ref 11.5–15.5)
WBC: 6.6 10*3/uL (ref 4.0–10.5)

## 2015-11-16 LAB — POC URINALSYSI DIPSTICK (AUTOMATED)
BILIRUBIN UA: NEGATIVE
GLUCOSE UA: NEGATIVE
Ketones, UA: NEGATIVE
LEUKOCYTES UA: NEGATIVE
NITRITE UA: NEGATIVE
Protein, UA: NEGATIVE
RBC UA: NEGATIVE
Spec Grav, UA: 1.01
UROBILINOGEN UA: 0.2
pH, UA: 7

## 2015-11-16 LAB — LIPID PANEL
CHOLESTEROL: 168 mg/dL (ref 0–200)
HDL: 45.6 mg/dL (ref >39.00)
NonHDL: 122.67
TRIGLYCERIDES: 202 mg/dL — AB (ref 0.0–149.0)
Total CHOL/HDL Ratio: 4
VLDL: 40.4 mg/dL — ABNORMAL HIGH (ref 0.0–40.0)

## 2015-11-16 LAB — HEPATIC FUNCTION PANEL
ALBUMIN: 4 g/dL (ref 3.5–5.2)
ALT: 16 U/L (ref 0–35)
AST: 14 U/L (ref 0–37)
Alkaline Phosphatase: 56 U/L (ref 39–117)
Bilirubin, Direct: 0.1 mg/dL (ref 0.0–0.3)
TOTAL PROTEIN: 6.4 g/dL (ref 6.0–8.3)
Total Bilirubin: 0.5 mg/dL (ref 0.2–1.2)

## 2015-11-16 LAB — TSH: TSH: 2.42 u[IU]/mL (ref 0.35–4.50)

## 2015-11-16 LAB — BASIC METABOLIC PANEL
BUN: 16 mg/dL (ref 6–23)
CALCIUM: 8.9 mg/dL (ref 8.4–10.5)
CHLORIDE: 109 meq/L (ref 96–112)
CO2: 21 meq/L (ref 19–32)
CREATININE: 1.04 mg/dL (ref 0.40–1.20)
GFR: 56 mL/min — ABNORMAL LOW (ref >60.00)
GLUCOSE: 96 mg/dL (ref 70–99)
Potassium: 3.8 mEq/L (ref 3.5–5.1)
SODIUM: 138 meq/L (ref 135–145)

## 2015-11-16 LAB — LDL CHOLESTEROL, DIRECT: LDL DIRECT: 90 mg/dL

## 2015-11-17 ENCOUNTER — Encounter: Payer: Self-pay | Admitting: Family Medicine

## 2016-01-05 DIAGNOSIS — E663 Overweight: Secondary | ICD-10-CM | POA: Diagnosis not present

## 2016-01-12 ENCOUNTER — Ambulatory Visit (INDEPENDENT_AMBULATORY_CARE_PROVIDER_SITE_OTHER): Payer: Medicare Other

## 2016-01-12 DIAGNOSIS — Z23 Encounter for immunization: Secondary | ICD-10-CM

## 2016-02-07 ENCOUNTER — Other Ambulatory Visit: Payer: Self-pay | Admitting: Family Medicine

## 2016-02-14 ENCOUNTER — Telehealth: Payer: Self-pay | Admitting: Internal Medicine

## 2016-02-15 NOTE — Telephone Encounter (Signed)
Dr Jacobs is this ok? 

## 2016-02-15 NOTE — Telephone Encounter (Signed)
i'm happy to see her.  Can you book her for next open rov with me, not extender.  Thanks

## 2016-02-15 NOTE — Telephone Encounter (Signed)
Left message on machine to call back  

## 2016-02-21 NOTE — Telephone Encounter (Signed)
Left message on machine to call back  

## 2016-02-22 NOTE — Telephone Encounter (Signed)
Left message for the pt to return call to set up appt with Dr Ardis Hughs.

## 2016-02-24 ENCOUNTER — Telehealth: Payer: Self-pay

## 2016-02-24 NOTE — Telephone Encounter (Signed)
-----   Message from Irven Baltimore sent at 02/24/2016  8:05 AM EDT ----- Patient returned phone call. Best # [phone]

## 2016-02-26 ENCOUNTER — Encounter: Payer: Self-pay | Admitting: Gastroenterology

## 2016-02-27 NOTE — Telephone Encounter (Signed)
Pt appt has been moved up to 03/12/16, she is aware and will call with any further questions

## 2016-03-01 ENCOUNTER — Other Ambulatory Visit: Payer: Self-pay | Admitting: Family Medicine

## 2016-03-12 ENCOUNTER — Ambulatory Visit (INDEPENDENT_AMBULATORY_CARE_PROVIDER_SITE_OTHER): Payer: Medicare Other | Admitting: Gastroenterology

## 2016-03-12 ENCOUNTER — Encounter: Payer: Self-pay | Admitting: Gastroenterology

## 2016-03-12 VITALS — BP 136/72 | HR 84 | Ht 63.0 in | Wt 167.0 lb

## 2016-03-12 DIAGNOSIS — K219 Gastro-esophageal reflux disease without esophagitis: Secondary | ICD-10-CM | POA: Diagnosis not present

## 2016-03-12 DIAGNOSIS — H04123 Dry eye syndrome of bilateral lacrimal glands: Secondary | ICD-10-CM | POA: Diagnosis not present

## 2016-03-12 DIAGNOSIS — R1310 Dysphagia, unspecified: Secondary | ICD-10-CM

## 2016-03-12 DIAGNOSIS — H52223 Regular astigmatism, bilateral: Secondary | ICD-10-CM | POA: Diagnosis not present

## 2016-03-12 DIAGNOSIS — H5213 Myopia, bilateral: Secondary | ICD-10-CM | POA: Diagnosis not present

## 2016-03-12 DIAGNOSIS — H524 Presbyopia: Secondary | ICD-10-CM | POA: Diagnosis not present

## 2016-03-12 NOTE — Progress Notes (Signed)
HPI: This is a very pleasant 70 year old woman whom I am meeting for the first time today  Chief complaint is recurrent dysphasia   22 egd with dilation did help for about a year and a half. Symptoms came back several months ago.  Has been cutting her foods smaller.    She will have intermittent sensation of dysphagia with solid foods at times. She has been trying to chew her best.  Taking nexium in AM, pepcid at bedtime, rolaids tweice per week.  Waking at times in middle of night.  nexium is in AM, eats a few hours later.   EGD, Dr. Delfin Edis, September 2015. Done for "dysphasia and history of Barrett's esophagus". She described what looks like a Schatzki's ring which was dilated with Savary dilators.. This was above a small hiatal hernia. She described Barrett's appearing mucosa, however my review of the pictures I cannot see any Barrett's appearing mucosa. Pathology showed no intestinal metaplasia. Looking back on her records from Dr. Jim Desanctis, an office note in 2009, he stated that she did not have a history of Barrett's esophagus.  Colonoscopy, Dr. Delfin Edis, 2011. Done for routine screening. This showed left sided diverticulosis but was otherwise normal. She was recommended to have repeat colonoscopy for routine risk colon cancer screening at 10 year interval.   ROS: complete GI ROS as described in HPI.  Constitutional:  No unintentional weight loss   Past Medical History:  Diagnosis Date  . Amnesia, global, transient 2015   see hospital notes  . Anxiety   . Barrett esophagus    Dr. Lajoyce Corners  . Diverticulosis   . GERD (gastroesophageal reflux disease)   . Hx of colonic polyps    Dr. Lajoyce Corners   . Hyperlipidemia   . Hypothyroidism   . Internal hemorrhoid   . Leukoplakia, vulva   . Sleep apnea     Past Surgical History:  Procedure Laterality Date  . BREAST ENHANCEMENT SURGERY    . DILATION AND CURETTAGE OF UTERUS     x 2  . HYSTEROSCOPY    . LASIK     eye  . TUBAL  LIGATION      Current Outpatient Prescriptions  Medication Sig Dispense Refill  . atorvastatin (LIPITOR) 40 MG tablet TAKE 1 TABLET ONCE DAILY AT 6 PM. 90 tablet 3  . citalopram (CELEXA) 10 MG tablet Take 1 tablet (10 mg total) by mouth daily. 90 tablet 3  . esomeprazole (NEXIUM) 40 MG capsule TAKE 1 CAPSULE ONCE DAILY BEFORE BREAKFAST. 90 capsule 3  . estradiol (EVAMIST) 1.53 MG/SPRAY transdermal spray Place 1 spray onto the skin daily.    . Estradiol-Estriol-Progesterone (BIEST/PROGESTERONE TD) Take 1 tablet by mouth 2 (two) times daily.    . famotidine (PEPCID) 20 MG tablet Take 20 mg by mouth as needed.     . fluticasone (FLONASE) 50 MCG/ACT nasal spray USE 2 SPRAYS IN EACH NOSTRIL ONCE A DAY. 16 g 2  . furosemide (LASIX) 20 MG tablet Take 1 tablet (20 mg total) by mouth daily as needed for fluid or edema. 90 tablet 1  . levothyroxine (SYNTHROID, LEVOTHROID) 50 MCG tablet TAKE 1 TABLET ONCE DAILY. 30 tablet 5  . nystatin-triamcinolone (MYCOLOG II) cream     . zolpidem (AMBIEN) 10 MG tablet TAKE 1/2 TABLET AT BEDTIME AS NEEDED FOR SLEEP. 15 tablet 5   No current facility-administered medications for this visit.     Allergies as of 03/12/2016  . (No Known Allergies)  Family History  Problem Relation Age of Onset  . Heart disease Mother     pacemaker  . Hypertension Mother   . Depression Mother   . Diabetes Mother   . Dementia Mother   . Cancer Father 6    lung, smoker  . Seizures Brother   . Colon cancer Neg Hx   . Esophageal cancer Neg Hx   . Stomach cancer Neg Hx     Social History   Social History  . Marital status: Married    Spouse name: N/A  . Number of children: 0  . Years of education: N/A   Occupational History  . Judicial Assistant  [employer]   Social History Main Topics  . Smoking status: Never Smoker  . Smokeless tobacco: Never Used  . Alcohol use 0.6 oz/week    1 Glasses of wine per week  . Drug use: No  . Sexual activity: No      Comment: BTL   Other Topics Concern  . Not on file   Social History Narrative   Married (Husband with Dr. Linna Darner), no children.       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 female with GERD, recurrent intermittent solid food dysphagia that is likely GERD related.  I recommended she change the way she is taking her proton pump inhibitor so it is shortly before her dinner meal. She does not eat breakfast. She will continue H2 blocker at bedtime nightly. I recommended we proceed with EGD and dilation at her soonest convenience. She did have good relief from dilation of Schatzki's ring about 2-1/2 years ago.   Owens Loffler, MD [facility] 03/12/2016, 1:59 PM

## 2016-03-12 NOTE — Patient Instructions (Signed)
You should change the way you are taking your antiacid medicine (nexium) so that you are taking it 20-30 minutes prior to a decent meal as that is the way the pill is designed to work most effectively.  Before your dinner meal. Pepcid at bedtime every night. You will be set up for an upper endoscopy with dilation for your dysphagia.

## 2016-03-23 ENCOUNTER — Encounter: Payer: Self-pay | Admitting: Gastroenterology

## 2016-03-23 ENCOUNTER — Ambulatory Visit (AMBULATORY_SURGERY_CENTER): Payer: Medicare Other | Admitting: Gastroenterology

## 2016-03-23 VITALS — BP 113/71 | HR 67 | Temp 97.1°F | Resp 18 | Ht 63.0 in | Wt 167.0 lb

## 2016-03-23 DIAGNOSIS — K449 Diaphragmatic hernia without obstruction or gangrene: Secondary | ICD-10-CM | POA: Diagnosis not present

## 2016-03-23 DIAGNOSIS — G4730 Sleep apnea, unspecified: Secondary | ICD-10-CM | POA: Diagnosis not present

## 2016-03-23 DIAGNOSIS — R1310 Dysphagia, unspecified: Secondary | ICD-10-CM

## 2016-03-23 DIAGNOSIS — K222 Esophageal obstruction: Secondary | ICD-10-CM

## 2016-03-23 DIAGNOSIS — E039 Hypothyroidism, unspecified: Secondary | ICD-10-CM | POA: Diagnosis not present

## 2016-03-23 MED ORDER — SODIUM CHLORIDE 0.9 % IV SOLN: 500.0000 mL | INTRAVENOUS | Status: DC

## 2016-03-23 NOTE — Op Note (Signed)
[facility] Patient Name: Megan Booth Procedure Date: 03/23/2016 7:28 AM MRN: [id number] Endoscopist: Milus Banister , MD Age: 70 Referring MD:  Date of Birth: [birth year] Gender: Female Account #: [id number] Procedure:                Upper GI endoscopy Indications:              Dysphagia, Heartburn Medicines:                Monitored Anesthesia Care Procedure:                Pre-Anesthesia Assessment:                           - Prior to the procedure, a History and Physical                            was performed, and patient medications and                            allergies were reviewed. The patient's tolerance of                            previous anesthesia was also reviewed. The risks                            and benefits of the procedure and the sedation                            options and risks were discussed with the patient.                            All questions were answered, and informed consent                            was obtained. Prior Anticoagulants: The patient has                            taken no previous anticoagulant or antiplatelet                            agents. ASA Grade Assessment: II - A patient with                            mild systemic disease. After reviewing the risks                            and benefits, the patient was deemed in                            satisfactory condition to undergo the procedure.                           After obtaining informed consent, the endoscope was  passed under direct vision. Throughout the                            procedure, the patient's blood pressure, pulse, and                            oxygen saturations were monitored continuously. The                            Model GIF-HQ190 [id number]) scope was introduced                            through the mouth, and advanced to the second part                            of duodenum. The upper GI  endoscopy was                            accomplished without difficulty. The patient                            tolerated the procedure well. Scope In: Scope Out: Findings:                 One mild benign-appearing, intrinsic stenosis                            (Schatzki's ring vs focal peptic stricture) was                            found at the gastroesophageal junction. A TTS                            dilator was passed through the scope. Dilation with                            an 18-19-20 mm balloon dilator was performed to 20                            mm.                           A small hiatal hernia was present.                           The examined duodenum was normal. Complications:            No immediate complications. Estimated blood loss:                            None. Estimated Blood Loss:     Estimated blood loss: none. Impression:               - Mild schatzki's ring above a small hiatal hernia.  Dilated with CRE balloon.                           - Small hiatal hernia.                           - Normal examined duodenum.                           - No specimens collected. Recommendation:           - Patient has a contact number available for                            emergencies. The signs and symptoms of potential                            delayed complications were discussed with the                            patient. Return to normal activities tomorrow.                            Written discharge instructions were provided to the                            patient.                           - Resume previous diet.                           - Continue present medications. PPI before dinner                            and H2 blocker at bedtime nightly.                           - Repeat upper endoscopy PRN for retreatment. Milus Banister, MD 03/23/2016 7:42:02 AM This report has been signed electronically.

## 2016-03-23 NOTE — Progress Notes (Signed)
Called to room to assist during endoscopic procedure.  Patient ID and intended procedure confirmed with present staff. Received instructions for my participation in the procedure from the performing physician.  

## 2016-03-23 NOTE — Progress Notes (Signed)
To recovery, report to Mir[place], RN, VSS. 

## 2016-03-23 NOTE — Patient Instructions (Signed)
YOU HAD AN ENDOSCOPIC PROCEDURE TODAY AT [facility]:   Refer to the procedure report that was given to you for any specific questions about what was found during the examination.  If the procedure report does not answer your questions, please call your gastroenterologist to clarify.  If you requested that your care partner not be given the details of your procedure findings, then the procedure report has been included in a sealed envelope for you to review at your convenience later.  YOU SHOULD EXPECT: Some feelings of bloating in the abdomen. Passage of more gas than usual.  Walking can help get rid of the air that was put into your GI tract during the procedure and reduce the bloating. If you had a lower endoscopy (such as a colonoscopy or flexible sigmoidoscopy) you may notice spotting of blood in your stool or on the toilet paper. If you underwent a bowel prep for your procedure, you may not have a normal bowel movement for a few days.  Please Note:  You might notice some irritation and congestion in your nose or some drainage.  This is from the oxygen used during your procedure.  There is no need for concern and it should clear up in a day or so.  SYMPTOMS TO REPORT IMMEDIATELY:    Following upper endoscopy (EGD)  Vomiting of blood or coffee ground material  New chest pain or pain under the shoulder blades  Painful or persistently difficult swallowing  New shortness of breath  Fever of 100F or higher  Black, tarry-looking stools  For urgent or emergent issues, a gastroenterologist can be reached at any hour by calling [phone].   DIET:  We do recommend a small meal at first, but then you may proceed to your regular diet.  Drink plenty of fluids but you should avoid alcoholic beverages for 24 hours.  ACTIVITY:  You should plan to take it easy for the rest of today and you should NOT DRIVE or use heavy machinery until tomorrow (because of the sedation medicines used  during the test).    FOLLOW UP: Our staff will call the number listed on your records the next business day following your procedure to check on you and address any questions or concerns that you may have regarding the information given to you following your procedure. If we do not reach you, we will leave a message.  However, if you are feeling well and you are not experiencing any problems, there is no need to return our call.  We will assume that you have returned to your regular daily activities without incident.  If any biopsies were taken you will be contacted by phone or by letter within the next 1-3 weeks.  Please call us at [phone] if you have not heard about the biopsies in 3 weeks.    SIGNATURES/CONFIDENTIALITY: You and/or your care partner have signed paperwork which will be entered into your electronic medical record.  These signatures attest to the fact that that the information above on your After Visit Summary has been reviewed and is understood.  Full responsibility of the confidentiality of this discharge information lies with you and/or your care-partner.  Dilation diet, hiatal hernia, stricture-handouts given  Continue Nexium before dinner, and famotidine at bed time.  Repeat dilation as needed.

## 2016-03-26 ENCOUNTER — Telehealth: Payer: Self-pay | Admitting: *Deleted

## 2016-03-26 NOTE — Telephone Encounter (Signed)
  Follow up Call-  Call back number 03/23/2016 01/04/2014  Post procedure Call Back phone  # [phone]  Permission to leave phone message Yes Yes  Some recent data might be hidden      Patient questions:  Do you have a fever, pain , or abdominal swelling? No. Pain Score  0 *  Have you tolerated food without any problems? Yes.    Have you been able to return to your normal activities? Yes.    Do you have any questions about your discharge instructions: Diet   No. Medications  No. Follow up visit  No.  Do you have questions or concerns about your Care? No.  Actions: * If pain score is 4 or above: No action needed, pain <4.

## 2016-04-02 ENCOUNTER — Encounter: Payer: Self-pay | Admitting: Gastroenterology

## 2016-04-09 ENCOUNTER — Ambulatory Visit (INDEPENDENT_AMBULATORY_CARE_PROVIDER_SITE_OTHER): Payer: Medicare Other | Admitting: Family Medicine

## 2016-04-09 ENCOUNTER — Encounter: Payer: Self-pay | Admitting: Family Medicine

## 2016-04-09 VITALS — BP 138/86 | HR 76 | Temp 97.8°F | Wt 171.4 lb

## 2016-04-09 DIAGNOSIS — R05 Cough: Secondary | ICD-10-CM

## 2016-04-09 DIAGNOSIS — R059 Cough, unspecified: Secondary | ICD-10-CM

## 2016-04-09 DIAGNOSIS — J069 Acute upper respiratory infection, unspecified: Secondary | ICD-10-CM | POA: Diagnosis not present

## 2016-04-09 MED ORDER — BENZONATATE 100 MG PO CAPS
100.0000 mg | ORAL_CAPSULE | Freq: Three times a day (TID) | ORAL | 0 refills | Status: DC
Start: 2016-04-09 — End: 2016-04-17

## 2016-04-09 NOTE — Patient Instructions (Signed)
Your symptoms today are most likely caused by viral illness. Please drink plenty of water enough for your urine to be pale yellow or clear. You may use Tylenol 325 mg every 6 hours as needed. Mucinex DM can be used for cough. Follow-up for evaluation if your symptoms do not improve in 3-4 days, worsen, or you develop a fever greater than 100.  You can also continue Allegra and flonase for symptom relief.   Upper Respiratory Infection, Adult Most upper respiratory infections (URIs) are a viral infection of the air passages leading to the lungs. A URI affects the nose, throat, and upper air passages. The most common type of URI is nasopharyngitis and is typically referred to as "the common cold." URIs run their course and usually go away on their own. Most of the time, a URI does not require medical attention, but sometimes a bacterial infection in the upper airways can follow a viral infection. This is called a secondary infection. Sinus and middle ear infections are common types of secondary upper respiratory infections. Bacterial pneumonia can also complicate a URI. A URI can worsen asthma and chronic obstructive pulmonary disease (COPD). Sometimes, these complications can require emergency medical care and may be life threatening. What are the causes? Almost all URIs are caused by viruses. A virus is a type of germ and can spread from one person to another. What increases the risk? You may be at risk for a URI if:  You smoke.  You have chronic heart or lung disease.  You have a weakened defense (immune) system.  You are very young or very old.  You have nasal allergies or asthma.  You work in crowded or poorly ventilated areas.  You work in health care facilities or schools. What are the signs or symptoms? Symptoms typically develop 2-3 days after you come in contact with a cold virus. Most viral URIs last 7-10 days. However, viral URIs from the influenza virus (flu virus) can last  14-18 days and are typically more severe. Symptoms may include:  Runny or stuffy (congested) nose.  Sneezing.  Cough.  Sore throat.  Headache.  Fatigue.  Fever.  Loss of appetite.  Pain in your forehead, behind your eyes, and over your cheekbones (sinus pain).  Muscle aches. How is this diagnosed? Your health care provider may diagnose a URI by:  Physical exam.  Tests to check that your symptoms are not due to another condition such as:  Strep throat.  Sinusitis.  Pneumonia.  Asthma. How is this treated? A URI goes away on its own with time. It cannot be cured with medicines, but medicines may be prescribed or recommended to relieve symptoms. Medicines may help:  Reduce your fever.  Reduce your cough.  Relieve nasal congestion. Follow these instructions at home:  Take medicines only as directed by your health care provider.  Gargle warm saltwater or take cough drops to comfort your throat as directed by your health care provider.  Use a warm mist humidifier or inhale steam from a shower to increase air moisture. This may make it easier to breathe.  Drink enough fluid to keep your urine clear or pale yellow.  Eat soups and other clear broths and maintain good nutrition.  Rest as needed.  Return to work when your temperature has returned to normal or as your health care provider advises. You may need to stay home longer to avoid infecting others. You can also use a face mask and careful hand washing to  prevent spread of the virus.  Increase the usage of your inhaler if you have asthma.  Do not use any tobacco products, including cigarettes, chewing tobacco, or electronic cigarettes. If you need help quitting, ask your health care provider. How is this prevented? The best way to protect yourself from getting a cold is to practice good hygiene.  Avoid oral or hand contact with people with cold symptoms.  Wash your hands often if contact occurs. There is  no clear evidence that vitamin C, vitamin E, echinacea, or exercise reduces the chance of developing a cold. However, it is always recommended to get plenty of rest, exercise, and practice good nutrition. Contact a health care provider if:  You are getting worse rather than better.  Your symptoms are not controlled by medicine.  You have chills.  You have worsening shortness of breath.  You have brown or red mucus.  You have yellow or brown nasal discharge.  You have pain in your face, especially when you bend forward.  You have a fever.  You have swollen neck glands.  You have pain while swallowing.  You have white areas in the back of your throat. Get help right away if:  You have severe or persistent:  Headache.  Ear pain.  Sinus pain.  Chest pain.  You have chronic lung disease and any of the following:  Wheezing.  Prolonged cough.  Coughing up blood.  A change in your usual mucus.  You have a stiff neck.  You have changes in your:  Vision.  Hearing.  Thinking.  Mood. This information is not intended to replace advice given to you by your health care provider. Make sure you discuss any questions you have with your health care provider. Document Released: 10/03/2000 Document Revised: 12/11/2015 Document Reviewed: 07/15/2013 Elsevier Interactive Patient Education  2017 [employer].

## 2016-04-09 NOTE — Progress Notes (Signed)
Pre visit review using our clinic review tool, if applicable. No additional management support is needed unless otherwise documented below in the visit note. 

## 2016-04-09 NOTE — Progress Notes (Signed)
Subjective:    Patient ID: Mosetta Pigeon, female    DOB: 1945/06/02, 70 y.o.   MRN: [id number]  HPI  Ms. Krough is a 70 year old female who presents with a cough that is nonproductive that started 5 days ago.  Cough is worse and keeps patient up at night.  Associated rhinitis with yellow drainage, sinus pressure, chills, sweats, nausea. She denies vomiting/diarrhea, SOB, ear pain, or tooth pain. She is not a smoker. No history of asthma/bronchitis. She is UTD with influenza vaccine. No recent antibiotic use. Recent sick contact exposure at church.  Treatment with Allegra and OTC cold medication has provided limited beneift.  Review of Systems  Constitutional: Positive for chills. Negative for fever.  HENT: Positive for congestion, postnasal drip, rhinorrhea and sinus pressure. Negative for ear pain and sore throat.   Respiratory: Positive for cough. Negative for shortness of breath and wheezing.   Cardiovascular: Negative for chest pain and palpitations.  Gastrointestinal: Positive for nausea. Negative for abdominal pain, diarrhea and vomiting.  Musculoskeletal: Negative for myalgias.  Neurological: Negative for dizziness and light-headedness.   Past Medical History:  Diagnosis Date  . Amnesia, global, transient 2015   see hospital notes  . Anxiety   . Barrett esophagus    Dr. Lajoyce Corners  . Diverticulosis   . GERD (gastroesophageal reflux disease)   . Hx of colonic polyps    Dr. Lajoyce Corners   . Hyperlipidemia   . Hypothyroidism   . Internal hemorrhoid   . Leukoplakia, vulva   . Sleep apnea      Social History   Social History  . Marital status: Married    Spouse name: N/A  . Number of children: 0  . Years of education: N/A   Occupational History  . Judicial Assistant  [employer]   Social History Main Topics  . Smoking status: Never Smoker  . Smokeless tobacco: Never Used  . Alcohol use 0.6 oz/week    1 Glasses of wine per week  . Drug use: No  . Sexual  activity: No     Comment: BTL   Other Topics Concern  . Not on file   Social History Narrative   Married (Husband with Dr. Linna Darner), no children.       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 BREAST ENHANCEMENT SURGERY    . DILATION AND CURETTAGE OF UTERUS     x 2  . HYSTEROSCOPY    . LASIK     eye  . TUBAL LIGATION      Family History  Problem Relation Age of Onset  . Heart disease Mother     pacemaker  . Hypertension Mother   . Depression Mother   . Diabetes Mother   . Dementia Mother   . Cancer Father 69    lung, smoker  . Seizures Brother   . Colon cancer Neg Hx   . Esophageal cancer Neg Hx   . Stomach cancer Neg Hx     No Known Allergies  Current Outpatient Prescriptions on File Prior to Visit  Medication Sig Dispense Refill  . atorvastatin (LIPITOR) 40 MG tablet TAKE 1 TABLET ONCE DAILY AT 6 PM. 90 tablet 3  . citalopram (CELEXA) 10 MG tablet Take 1 tablet (10 mg total) by mouth daily. 90 tablet 3  . esomeprazole (NEXIUM) 40 MG capsule  TAKE 1 CAPSULE ONCE DAILY BEFORE BREAKFAST. 90 capsule 3  . estradiol (EVAMIST) 1.53 MG/SPRAY transdermal spray Place 1 spray onto the skin daily.    . Estradiol-Estriol-Progesterone (BIEST/PROGESTERONE TD) Take 1 tablet by mouth 2 (two) times daily.    . famotidine (PEPCID) 20 MG tablet Take 20 mg by mouth as needed.     . fluticasone (FLONASE) 50 MCG/ACT nasal spray USE 2 SPRAYS IN EACH NOSTRIL ONCE A DAY. 16 g 2  . furosemide (LASIX) 20 MG tablet Take 1 tablet (20 mg total) by mouth daily as needed for fluid or edema. 90 tablet 1  . levothyroxine (SYNTHROID, LEVOTHROID) 50 MCG tablet TAKE 1 TABLET ONCE DAILY. 30 tablet 5  . nystatin-triamcinolone (MYCOLOG II) cream     . Progesterone Micronized (PROGESTERONE, BULK,) POWD     . zolpidem (AMBIEN) 10 MG tablet TAKE 1/2 TABLET AT BEDTIME AS NEEDED FOR SLEEP. 15 tablet 5     Current Facility-Administered Medications on File Prior to Visit  Medication Dose Route Frequency Provider Last Rate Last Dose  . 0.9 %  sodium chloride infusion  500 mL Intravenous Continuous Milus Banister, MD        BP 138/86 (BP Location: Left Arm, Patient Position: Sitting, Cuff Size: Normal)   Pulse 76   Temp 97.8 F (36.6 C) (Oral)   Wt 171 lb 6.4 oz (77.7 kg)   SpO2 97%   BMI 30.36 kg/m        Objective:   Physical Exam  Constitutional: She is oriented to person, place, and time. She appears well-developed and well-nourished.  HENT:  Right Ear: Tympanic membrane normal.  Left Ear: Tympanic membrane normal.  Nose: Right sinus exhibits maxillary sinus tenderness. Right sinus exhibits no frontal sinus tenderness. Left sinus exhibits maxillary sinus tenderness. Left sinus exhibits no frontal sinus tenderness.  Mouth/Throat: No oropharyngeal exudate or posterior oropharyngeal erythema.  Eyes: Pupils are equal, round, and reactive to light. No scleral icterus.  Neck: Neck supple.  Cardiovascular: Normal rate and regular rhythm.   Pulmonary/Chest: Effort normal and breath sounds normal. She has no wheezes. She has no rales.  Abdominal: There is no rebound.  Lymphadenopathy:    She has no cervical adenopathy.  Neurological: She is alert and oriented to person, place, and time. Coordination normal.  Skin: Skin is warm and dry. No rash noted.       Assessment & Plan:  1. Acute upper respiratory infection Suspect that symptoms are viral in nature. VSS; exam reassuring. Advised patient on supportive measures:  Get rest, drink plenty of fluids, and use tylenol or as needed. Follow up if fever >10o, if symptoms worsen or if symptoms are not improved in 3 to 4  days. Patient verbalizes understanding.     2. Cough Mucinex DM for cough or benzonatate for cough that is not controlled with Mucinex. - benzonatate (TESSALON) 100 MG capsule; Take 1 capsule (100 mg total) by mouth  3 (three) times daily.  Dispense: 20 capsule; Refill: 0  Delano Metz, FNP-C

## 2016-04-17 ENCOUNTER — Other Ambulatory Visit: Payer: Self-pay | Admitting: Family Medicine

## 2016-04-17 DIAGNOSIS — R05 Cough: Secondary | ICD-10-CM

## 2016-04-17 DIAGNOSIS — R059 Cough, unspecified: Secondary | ICD-10-CM

## 2016-04-17 NOTE — Telephone Encounter (Signed)
Okay to refill? 

## 2016-04-22 NOTE — Telephone Encounter (Signed)
One refill will be provided. If symptoms are not improving, worsening, or she is developing new symptoms, advise follow up for further evaluation and treatment.

## 2016-04-30 NOTE — Telephone Encounter (Signed)
Spoke to patient verbalized understanding that medication was sent to the pharmacy and if symptoms are not improving, worsening, or develops new symptoms to follow up for further evaluation and treatment per Gregary Signs.

## 2016-05-08 ENCOUNTER — Ambulatory Visit: Payer: Medicare Other | Admitting: Gastroenterology

## 2016-05-21 DIAGNOSIS — D100 Benign neoplasm of lip: Secondary | ICD-10-CM | POA: Diagnosis not present

## 2016-05-25 DIAGNOSIS — K136 Irritative hyperplasia of oral mucosa: Secondary | ICD-10-CM | POA: Diagnosis not present

## 2016-05-30 DIAGNOSIS — K136 Irritative hyperplasia of oral mucosa: Secondary | ICD-10-CM | POA: Diagnosis not present

## 2016-06-07 ENCOUNTER — Encounter: Payer: Self-pay | Admitting: Family Medicine

## 2016-06-08 DIAGNOSIS — L814 Other melanin hyperpigmentation: Secondary | ICD-10-CM | POA: Diagnosis not present

## 2016-06-08 DIAGNOSIS — Z85828 Personal history of other malignant neoplasm of skin: Secondary | ICD-10-CM | POA: Diagnosis not present

## 2016-06-08 DIAGNOSIS — L570 Actinic keratosis: Secondary | ICD-10-CM | POA: Diagnosis not present

## 2016-06-08 DIAGNOSIS — L820 Inflamed seborrheic keratosis: Secondary | ICD-10-CM | POA: Diagnosis not present

## 2016-06-08 DIAGNOSIS — L821 Other seborrheic keratosis: Secondary | ICD-10-CM | POA: Diagnosis not present

## 2016-06-11 ENCOUNTER — Encounter: Payer: Self-pay | Admitting: Family Medicine

## 2016-06-11 ENCOUNTER — Ambulatory Visit (INDEPENDENT_AMBULATORY_CARE_PROVIDER_SITE_OTHER): Payer: Medicare Other | Admitting: Family Medicine

## 2016-06-11 DIAGNOSIS — E669 Obesity, unspecified: Secondary | ICD-10-CM

## 2016-06-11 DIAGNOSIS — E663 Overweight: Secondary | ICD-10-CM | POA: Insufficient documentation

## 2016-06-11 MED ORDER — PHENTERMINE-TOPIRAMATE ER 7.5-46 MG PO CP24: 1.0000 | ORAL_CAPSULE | Freq: Every day | ORAL | 0 refills | Status: DC

## 2016-06-11 MED ORDER — PHENTERMINE-TOPIRAMATE ER 3.75-23 MG PO CP24: 1.0000 | ORAL_CAPSULE | Freq: Every day | ORAL | 0 refills | Status: DC

## 2016-06-11 NOTE — Patient Instructions (Addendum)
Take lower dose pill to start. After 14 pills completed, take other dose.   See me back in 4-5 weeks. If doing well may write for another 2 months worth. Goal weight loss at least down to 165 in this time frame.   Toss out the junk foods. Increase exercise up to 150 or 200 minutes a week. [employer] has reasonable [employer]. Could go walk in a store such as [employer] or [employer]  Would encourage you to small even a small breakfast

## 2016-06-11 NOTE — Assessment & Plan Note (Addendum)
S:Pilates twice a week and functional fitness with this- 45 minutes, walking 0-1 day a week about 30 minutes. Had been as high as 2 miles 3 days a week with neighbor.   Diet- had done isogenics for 10 months and lost weight down to 155 in September (got tired of shakes 2x a day).   Does not breakfast (counseled on this but hard for her to get breakfast down) Most luches has [place] sandwich [employer] and a salad. Often snacks on popcorns, chips, etc after dinner.  Sweets/snacking is a big issue anything chocolate, popcorn. States does not keep sweets in the house but these are "leftover from a party"  In the past has tried weight watchers and done nutrition counseling per her report.  A/P: she strongly requests something to help her reduce cravings/appetite. I advised increased exercise and eating breakfast as well as getting "junk" out of her house. She states she will do this but also needs assist. With GFR below 60- would likely max out at 7.5 mg phentermine/46 mg topiramate daily after 10 day trial of half this dose. Agreed weight goal at least 165 (down from 175 within 3 months). If this is expensive discussed orlistat (she did not want to start with this even though this was my recommendation).   Would need to taper dose to off if needed down to the 3.75 dose again to every other day. For at least a week. Follow up 4-6 weeks so we can make sure not hypertensive on regimen.   tsh has been at goal so hyperthyroidism does not prevent use.

## 2016-06-11 NOTE — Progress Notes (Signed)
Pre visit review using our clinic review tool, if applicable. No additional management support is needed unless otherwise documented below in the visit note. 

## 2016-06-11 NOTE — Progress Notes (Signed)
Subjective:  Megan Booth is a 71 y.o. year old very pleasant female patient who presents for/with See problem oriented charting ROS- No chest pain or shortness of breath. No headache or blurry vision.  Difficulty losing weight- tsh has been normal.   Past Medical History-  Patient Active Problem List   Diagnosis Date Noted  . Amnesia, global, transient     Priority: High  . Osteopenia 04/15/2015    Priority: Medium  . Hormone replacement therapy 08/12/2014    Priority: Medium  . Insomnia 08/12/2014    Priority: Medium  . Hyperlipidemia     Priority: Medium  . Sleep apnea     Priority: Medium  . Anxiety     Priority: Medium  . Edema 08/12/2014    Priority: Low  . GERD (gastroesophageal reflux disease)     Priority: Low  . Hx of colonic polyps     Priority: Low  . Leukoplakia, vulva     Priority: Low  . B12 deficiency 08/03/2011    Priority: Low  . Obesity, Class I, BMI 30-34.9 06/11/2016  . Maxillary sinusitis, acute 04/27/2015    Medications- reviewed and updated Current Outpatient Prescriptions  Medication Sig Dispense Refill  . atorvastatin (LIPITOR) 40 MG tablet TAKE 1 TABLET ONCE DAILY AT 6 PM. 90 tablet 3  . Biotin 5000 MCG CAPS Take 1 capsule by mouth.    . Cholecalciferol (VITAMIN D3) 1000 units CAPS Take 1 capsule by mouth daily.    . citalopram (CELEXA) 10 MG tablet Take 1 tablet (10 mg total) by mouth daily. 90 tablet 3  . esomeprazole (NEXIUM) 40 MG capsule TAKE 1 CAPSULE ONCE DAILY BEFORE BREAKFAST. 90 capsule 3  . Estradiol-Estriol-Progesterone (BIEST/PROGESTERONE TD) Take 1 tablet by mouth 2 (two) times daily.    . famotidine (PEPCID) 20 MG tablet Take 20 mg by mouth as needed.     . furosemide (LASIX) 20 MG tablet Take 1 tablet (20 mg total) by mouth daily as needed for fluid or edema. 90 tablet 1  . levothyroxine (SYNTHROID, LEVOTHROID) 50 MCG tablet TAKE 1 TABLET ONCE DAILY. 30 tablet 5  . Progesterone Micronized (PROGESTERONE, BULK,) POWD     .  zolpidem (AMBIEN) 10 MG tablet TAKE 1/2 TABLET AT BEDTIME AS NEEDED FOR SLEEP. 15 tablet 1   No current facility-administered medications for this visit.     Objective: BP 124/82 (BP Location: Left Arm, Patient Position: Sitting, Cuff Size: Large)   Pulse 81   Temp 98 F (36.7 C) (Oral)   Ht 5\' 3"  (1.6 m)   Wt 174 lb 9.6 oz (79.2 kg)   SpO2 95%   BMI 30.93 kg/m  Gen: NAD, resting comfortably CV: RRR no murmurs rubs or gallops Lungs: CTAB no crackles, wheeze, rhonchi Abdomen: soft/nontender/nondistended/normal bowel sounds.obese  Ext: no edema Skin: warm, dry  Assessment/Plan:  Obesity, Class I, BMI 30-34.9 S:Pilates twice a week and functional fitness with this- 45 minutes, walking 0-1 day a week about 30 minutes. Had been as high as 2 miles 3 days a week with neighbor.   Diet- had done isogenics for 10 months and lost weight down to 155 in September (got tired of shakes 2x a day).   Does not breakfast (counseled on this but hard for her to get breakfast down) Most luches has [place] sandwich [employer] and a salad. Often snacks on popcorns, chips, etc after dinner.  Sweets/snacking is a big issue anything chocolate, popcorn. States does not keep sweets in  the house but these are "leftover from a party"  In the past has tried weight watchers and done nutrition counseling per her report.  A/P: she strongly requests something to help her reduce cravings/appetite. I advised increased exercise and eating breakfast as well as getting "junk" out of her house. She states she will do this but also needs assist. With GFR below 60- would likely max out at 7.5 mg phentermine/46 mg topiramate daily after 10 day trial of half this dose. Agreed weight goal at least 165 (down from 175 within 3 months). If this is expensive discussed orlistat (she did not want to start with this even though this was my recommendation).   Would need to taper dose to off if needed down to the 3.75 dose again to  every other day. For at least a week. Follow up 4-6 weeks so we can make sure not hypertensive on regimen.   tsh has been at goal so hyperthyroidism does not prevent use.   4-6 weeks  Meds ordered this encounter  Medications  . Biotin 5000 MCG CAPS    Sig: Take 1 capsule by mouth.  . Cholecalciferol (VITAMIN D3) 1000 units CAPS    Sig: Take 1 capsule by mouth daily.  . Phentermine-Topiramate 3.75-23 MG CP24    Sig: Take 1 capsule by mouth daily.    Dispense:  14 capsule    Refill:  0  . Phentermine-Topiramate 7.5-46 MG CP24    Sig: Take 1 capsule by mouth daily.    Dispense:  30 capsule    Refill:  0   The duration of face-to-face time during this visit was greater than 25 minutes. Greater than 50% of this time was spent in counseling, explanation of diagnosis, planning of further management, and/or coordination of care including weight loss strategies, reviewing of medication options.     Return precautions advised.  Garret Reddish, MD

## 2016-07-07 ENCOUNTER — Encounter: Payer: Self-pay | Admitting: Family Medicine

## 2016-07-23 ENCOUNTER — Ambulatory Visit: Payer: Medicare Other | Admitting: Family Medicine

## 2016-07-23 ENCOUNTER — Telehealth: Payer: Self-pay

## 2016-07-23 NOTE — Telephone Encounter (Signed)
Received PA request from [facility] for Zolpidem. PA submitted & is pending. Key: [id number]

## 2016-07-23 NOTE — Telephone Encounter (Signed)
PA approved, form faxed back to pharmacy. 

## 2016-07-30 ENCOUNTER — Encounter: Payer: Self-pay | Admitting: Family Medicine

## 2016-07-30 ENCOUNTER — Ambulatory Visit (INDEPENDENT_AMBULATORY_CARE_PROVIDER_SITE_OTHER): Payer: Medicare Other | Admitting: Family Medicine

## 2016-07-30 DIAGNOSIS — E663 Overweight: Secondary | ICD-10-CM | POA: Diagnosis not present

## 2016-07-30 MED ORDER — PHENTERMINE-TOPIRAMATE ER 7.5-46 MG PO CP24: 1.0000 | ORAL_CAPSULE | Freq: Every day | ORAL | 2 refills | Status: DC

## 2016-07-30 NOTE — Progress Notes (Signed)
Pre visit review using our clinic review tool, if applicable. No additional management support is needed unless otherwise documented below in the visit note. 

## 2016-07-30 NOTE — Patient Instructions (Signed)
Fantastic job!  Wt Readings from Last 3 Encounters:  07/30/16 164 lb 3.2 oz (74.5 kg)  06/11/16 174 lb 9.6 oz (79.2 kg)   BP ok on repeat so ok to continue. If it were to be high when you see Manuela Schwartz- don't leave until I talk with you.   Cancel visit on 08/15/16 with me and change to a 10 o clock appointment with Wynetta Fines, RN our nurse specialist for these annual wellness visits.

## 2016-07-30 NOTE — Assessment & Plan Note (Addendum)
S: Walking 3 days a week, at gym another 2 days a week Not snacking between meals.  Taking medicine about 1. Phentermine really helping taking at this time- earlier in the day did not really cut down on her cravings which are more afternoon and evening Improved diet- oatmeal, boiled egg. Roll up or half sandwich. Veggies/meat at dinner.  Wt Readings from Last 3 Encounters:  07/30/16 164 lb 3.2 oz (74.5 kg)  06/11/16 174 lb 9.6 oz (79.2 kg)  04/09/16 171 lb 6.4 oz (77.7 kg)  A/P: has moved form obesity class I to overweight with 10 lbs weight loss. Continue phentermine with 3 month repeat. Do not increase dose. Would do likely 6 months maximum. Goal another 10-15 lbs in this time frame. BP looks ok on repeat. She will see Wynetta Fines, RN for AWV on 08/15/16- if BP up I would want to recheck before she leaves. Extended counseling

## 2016-07-30 NOTE — Progress Notes (Signed)
Subjective:  Megan Booth is a 71 y.o. year old very pleasant female patient who presents for/with See problem oriented charting ROS- No chest pain or shortness of breath. No headache or blurry vision.    Past Medical History-  Patient Active Problem List   Diagnosis Date Noted  . Amnesia, global, transient     Priority: High  . Osteopenia 04/15/2015    Priority: Medium  . Hormone replacement therapy 08/12/2014    Priority: Medium  . Insomnia 08/12/2014    Priority: Medium  . Hyperlipidemia     Priority: Medium  . Sleep apnea     Priority: Medium  . Anxiety     Priority: Medium  . Edema 08/12/2014    Priority: Low  . GERD (gastroesophageal reflux disease)     Priority: Low  . Hx of colonic polyps     Priority: Low  . Leukoplakia, vulva     Priority: Low  . B12 deficiency 08/03/2011    Priority: Low  . Overweight 06/11/2016  . Maxillary sinusitis, acute 04/27/2015    Medications- reviewed and updated Current Outpatient Prescriptions  Medication Sig Dispense Refill  . atorvastatin (LIPITOR) 40 MG tablet TAKE 1 TABLET ONCE DAILY AT 6 PM. 90 tablet 3  . Biotin 5000 MCG CAPS Take 1 capsule by mouth.    . Cholecalciferol (VITAMIN D3) 1000 units CAPS Take 1 capsule by mouth daily.    . citalopram (CELEXA) 10 MG tablet Take 1 tablet (10 mg total) by mouth daily. 90 tablet 3  . esomeprazole (NEXIUM) 40 MG capsule TAKE 1 CAPSULE ONCE DAILY BEFORE BREAKFAST. 90 capsule 3  . Estradiol-Estriol-Progesterone (BIEST/PROGESTERONE TD) Take 1 tablet by mouth 2 (two) times daily.    . famotidine (PEPCID) 20 MG tablet Take 20 mg by mouth as needed.     . furosemide (LASIX) 20 MG tablet Take 1 tablet (20 mg total) by mouth daily as needed for fluid or edema. 90 tablet 1  . levothyroxine (SYNTHROID, LEVOTHROID) 50 MCG tablet TAKE 1 TABLET ONCE DAILY. 30 tablet 5  . Phentermine-Topiramate 7.5-46 MG CP24 Take 1 capsule by mouth daily. 30 capsule 2  . Progesterone Micronized (PROGESTERONE,  BULK,) POWD     . zolpidem (AMBIEN) 10 MG tablet TAKE 1/2 TABLET AT BEDTIME AS NEEDED FOR SLEEP. 15 tablet 1   No current facility-administered medications for this visit.     Objective: BP 128/90 (BP Location: Left Arm, Patient Position: Sitting, Cuff Size: Large)   Pulse 95   Temp 98.1 F (36.7 C) (Oral)   Ht 5\' 3"  (1.6 m)   Wt 164 lb 3.2 oz (74.5 kg)   SpO2 97%   BMI 29.09 kg/m  Gen: NAD, resting comfortably CV: regular rate Lungs: nonlabored Abdomen: overweight Ext: no edema  Assessment/Plan:  Overweight S: Walking 3 days a week, at gym another 2 days a week Not snacking between meals.  Taking medicine about 1. Phentermine really helping taking at this time- earlier in the day did not really cut down on her cravings which are more afternoon and evening Improved diet- oatmeal, boiled egg. Roll up or half sandwich. Veggies/meat at dinner.  Wt Readings from Last 3 Encounters:  07/30/16 164 lb 3.2 oz (74.5 kg)  06/11/16 174 lb 9.6 oz (79.2 kg)  04/09/16 171 lb 6.4 oz (77.7 kg)  A/P: has moved form obesity class I to overweight with 10 lbs weight loss. Continue phentermine with 3 month repeat. Do not increase dose. Would do likely  6 months maximum. Goal another 10-15 lbs in this time frame. BP looks ok on repeat. She will see Wynetta Fines, RN for AWV on 08/15/16- if BP up I would want to recheck before she leaves. Extended counseling  Meds ordered this encounter  Medications  . Phentermine-Topiramate 7.5-46 MG CP24    Sig: Take 1 capsule by mouth daily.    Dispense:  30 capsule    Refill:  2   The duration of face-to-face time during this visit was greater than 15 minutes. Greater than 50% of this time was spent in counseling, explanation of diagnosis, planning of further management, and/or coordination of care including discussion of need for ongoing healthy eating and exercise as well as weight loss and ways to achieve this.    Return precautions advised.  Garret Reddish,  MD

## 2016-08-02 ENCOUNTER — Encounter: Payer: Self-pay | Admitting: Family Medicine

## 2016-08-02 DIAGNOSIS — Z79890 Hormone replacement therapy (postmenopausal): Secondary | ICD-10-CM | POA: Diagnosis not present

## 2016-08-15 ENCOUNTER — Ambulatory Visit: Payer: Medicare Other | Admitting: Family Medicine

## 2016-08-15 ENCOUNTER — Ambulatory Visit: Payer: Medicare Other

## 2016-08-22 ENCOUNTER — Encounter: Payer: Self-pay | Admitting: Family Medicine

## 2016-08-22 ENCOUNTER — Ambulatory Visit (INDEPENDENT_AMBULATORY_CARE_PROVIDER_SITE_OTHER): Payer: Medicare Other | Admitting: Family Medicine

## 2016-08-22 VITALS — BP 130/90 | HR 83 | Temp 97.8°F | Ht 63.0 in | Wt 164.2 lb

## 2016-08-22 DIAGNOSIS — B029 Zoster without complications: Secondary | ICD-10-CM

## 2016-08-22 MED ORDER — VALACYCLOVIR HCL 1 G PO TABS: 1000.0000 mg | ORAL_TABLET | Freq: Three times a day (TID) | ORAL | 0 refills | Status: DC

## 2016-08-22 NOTE — Progress Notes (Signed)
Subjective:  Megan Booth is a 71 y.o. year old very pleasant female patient who presents for/with See problem oriented charting ROS- see ros embedded below ros below   Past Medical History-  Patient Active Problem List   Diagnosis Date Noted  . Amnesia, global, transient     Priority: High  . Osteopenia 04/15/2015    Priority: Medium  . Hormone replacement therapy 08/12/2014    Priority: Medium  . Insomnia 08/12/2014    Priority: Medium  . Hyperlipidemia     Priority: Medium  . Sleep apnea     Priority: Medium  . Anxiety     Priority: Medium  . Edema 08/12/2014    Priority: Low  . GERD (gastroesophageal reflux disease)     Priority: Low  . Hx of colonic polyps     Priority: Low  . Leukoplakia, vulva     Priority: Low  . B12 deficiency 08/03/2011    Priority: Low  . Overweight 06/11/2016  . Maxillary sinusitis, acute 04/27/2015    Medications- reviewed and updated Current Outpatient Prescriptions  Medication Sig Dispense Refill  . atorvastatin (LIPITOR) 40 MG tablet TAKE 1 TABLET ONCE DAILY AT 6 PM. 90 tablet 3  . Biotin 5000 MCG CAPS Take 1 capsule by mouth.    . Cholecalciferol (VITAMIN D3) 1000 units CAPS Take 1 capsule by mouth daily.    . citalopram (CELEXA) 10 MG tablet Take 1 tablet (10 mg total) by mouth daily. 90 tablet 3  . esomeprazole (NEXIUM) 40 MG capsule TAKE 1 CAPSULE ONCE DAILY BEFORE BREAKFAST. 90 capsule 3  . Estradiol-Estriol-Progesterone (BIEST/PROGESTERONE TD) Take 1 tablet by mouth 2 (two) times daily.    . famotidine (PEPCID) 20 MG tablet Take 20 mg by mouth as needed.     . furosemide (LASIX) 20 MG tablet Take 1 tablet (20 mg total) by mouth daily as needed for fluid or edema. 90 tablet 1  . levothyroxine (SYNTHROID, LEVOTHROID) 50 MCG tablet TAKE 1 TABLET ONCE DAILY. 30 tablet 5  . Phentermine-Topiramate 7.5-46 MG CP24 Take 1 capsule by mouth daily. 30 capsule 2  . Progesterone Micronized (PROGESTERONE, BULK,) POWD     . zolpidem (AMBIEN)  10 MG tablet TAKE 1/2 TABLET AT BEDTIME AS NEEDED FOR SLEEP. 15 tablet 1   Objective: BP 130/90 (BP Location: Left Arm, Patient Position: Sitting, Cuff Size: Normal)   Pulse 83   Temp 97.8 F (36.6 C) (Oral)   Ht 5\' 3"  (1.6 m)   Wt 164 lb 3.2 oz (74.5 kg)   SpO2 96%   BMI 29.09 kg/m  Gen: NAD, resting comfortably CV: RRR no murmurs rubs or gallops Lungs: CTAB no crackles, wheeze, rhonchi Abdomen: soft/nontender/nondistended/normal bowel sounds. No rebound or guarding.  On left side just above umbilicus has erythematous raised rash in dermatomal distribution wrapping to back. Does not cross midline. Patient states area very tender. There are several vesicles and none scabbed over Ext: no edema Skin: warm, dry, no rash  Assessment/Plan:  Rash S:Rash starting on sunday. Started on back and wrapped around to abdomen. Still having new spots this morning unfortunately. Pain aching/burning 4/10 pain. Has not taken any medicine yet. Heat and ice make pain worse.  ROS-not ill appearing, no fever/chills. No new medications. Not immunocompromised. No mucus membrane involvement.  A/P: Shingles based on dermatomal distribution. Treat with valtrex for 7 days. Start tylenol 650 mg three times a day spaced by about 6 hours.   Meds ordered this encounter  Medications  .  valACYclovir (VALTREX) 1000 MG tablet    Sig: Take 1 tablet (1,000 mg total) by mouth 3 (three) times daily. For 7 days for shingles.    Dispense:  21 tablet    Refill:  0   Return precautions advised.  Garret Reddish, MD

## 2016-08-22 NOTE — Patient Instructions (Signed)
Treat with valtrex for 7 days. Goal of this is to get new lesions to stop popping up but will not get rid of the old lesions- may help them heal slightly quicker  For pain- Start tylenol 650 mg three times a day spaced by about 6 hours.    Shingles Shingles, which is also known as herpes zoster, is an infection that causes a painful skin rash and fluid-filled blisters. Shingles is not related to genital herpes, which is a sexually transmitted infection. Shingles only develops in people who:  Have had chickenpox.  Have received the chickenpox vaccine. (This is rare.) What are the causes? Shingles is caused by varicella-zoster virus (VZV). This is the same virus that causes chickenpox. After exposure to VZV, the virus stays in the body in an inactive (dormant) state. Shingles develops if the virus reactivates. This can happen many years after the initial exposure to VZV. It is not known what causes this virus to reactivate. What increases the risk? People who have had chickenpox or received the chickenpox vaccine are at risk for shingles. Infection is more common in people who:  Are older than age 28.  Have a weakened defense (immune) system, such as those with HIV, AIDS, or cancer.  Are taking medicines that weaken the immune system, such as transplant medicines.  Are under great stress. What are the signs or symptoms? Early symptoms of this condition include itching, tingling, and pain in an area on your skin. Pain may be described as burning, stabbing, or throbbing. A few days or weeks after symptoms start, a painful red rash appears, usually on one side of the body in a bandlike or beltlike pattern. The rash eventually turns into fluid-filled blisters that break open, scab over, and dry up in about 2-3 weeks. At any time during the infection, you may also develop:  A fever.  Chills.  A headache.  An upset stomach. How is this diagnosed? This condition is diagnosed with a skin  exam. Sometimes, skin or fluid samples are taken from the blisters before a diagnosis is made. These samples are examined under a microscope or sent to a lab for testing. How is this treated? There is no specific cure for this condition. Your health care provider will probably prescribe medicines to help you manage pain, recover more quickly, and avoid long-term problems. Medicines may include:  Antiviral drugs.  Anti-inflammatory drugs.  Pain medicines. If the area involved is on your face, you may be referred to a specialist, such as an eye doctor (ophthalmologist) or an ear, nose, and throat (ENT) doctor to help you avoid eye problems, chronic pain, or disability. Follow these instructions at home: Medicines   Take medicines only as directed by your health care provider.  Apply an anti-itch or numbing cream to the affected area as directed by your health care provider. Blister and Rash Care   Take a cool bath or apply cool compresses to the area of the rash or blisters as directed by your health care provider. This may help with pain and itching.  Keep your rash covered with a loose bandage (dressing). Wear loose-fitting clothing to help ease the pain of material rubbing against the rash.  Keep your rash and blisters clean with mild soap and cool water or as directed by your health care provider.  Check your rash every day for signs of infection. These include redness, swelling, and pain that lasts or increases.  Do not pick your blisters.  Do not  scratch your rash. General instructions   Rest as directed by your health care provider.  Keep all follow-up visits as directed by your health care provider. This is important.  Until your blisters scab over, your infection can cause chickenpox in people who have never had it or been vaccinated against it. To prevent this from happening, avoid contact with other people, especially:  Babies.  Pregnant women.  Children who have  eczema.  Elderly people who have transplants.  People who have chronic illnesses, such as leukemia or AIDS. Contact a health care provider if:  Your pain is not relieved with prescribed medicines.  Your pain does not get better after the rash heals.  Your rash looks infected. Signs of infection include redness, swelling, and pain that lasts or increases. Get help right away if:  The rash is on your face or nose.  You have facial pain, pain around your eye area, or loss of feeling on one side of your face.  You have ear pain or you have ringing in your ear.  You have loss of taste.  Your condition gets worse. This information is not intended to replace advice given to you by your health care provider. Make sure you discuss any questions you have with your health care provider. Document Released: 04/09/2005 Document Revised: 12/04/2015 Document Reviewed: 02/18/2014 Elsevier Interactive Patient Education  2017 [employer].

## 2016-08-22 NOTE — Progress Notes (Signed)
Pre visit review using our clinic review tool, if applicable. No additional management support is needed unless otherwise documented below in the visit note. 

## 2016-08-27 DIAGNOSIS — L814 Other melanin hyperpigmentation: Secondary | ICD-10-CM | POA: Diagnosis not present

## 2016-08-27 DIAGNOSIS — L821 Other seborrheic keratosis: Secondary | ICD-10-CM | POA: Diagnosis not present

## 2016-08-27 DIAGNOSIS — Z85828 Personal history of other malignant neoplasm of skin: Secondary | ICD-10-CM | POA: Diagnosis not present

## 2016-08-27 DIAGNOSIS — D2271 Melanocytic nevi of right lower limb, including hip: Secondary | ICD-10-CM | POA: Diagnosis not present

## 2016-08-27 DIAGNOSIS — D225 Melanocytic nevi of trunk: Secondary | ICD-10-CM | POA: Diagnosis not present

## 2016-08-27 DIAGNOSIS — B029 Zoster without complications: Secondary | ICD-10-CM | POA: Diagnosis not present

## 2016-08-27 DIAGNOSIS — D485 Neoplasm of uncertain behavior of skin: Secondary | ICD-10-CM | POA: Diagnosis not present

## 2016-09-03 ENCOUNTER — Other Ambulatory Visit: Payer: Self-pay | Admitting: Family Medicine

## 2016-09-03 DIAGNOSIS — L988 Other specified disorders of the skin and subcutaneous tissue: Secondary | ICD-10-CM | POA: Diagnosis not present

## 2016-09-03 DIAGNOSIS — Z85828 Personal history of other malignant neoplasm of skin: Secondary | ICD-10-CM | POA: Diagnosis not present

## 2016-09-03 DIAGNOSIS — D485 Neoplasm of uncertain behavior of skin: Secondary | ICD-10-CM | POA: Diagnosis not present

## 2016-09-13 DIAGNOSIS — Z4802 Encounter for removal of sutures: Secondary | ICD-10-CM | POA: Diagnosis not present

## 2016-09-14 ENCOUNTER — Ambulatory Visit (INDEPENDENT_AMBULATORY_CARE_PROVIDER_SITE_OTHER): Payer: Medicare Other

## 2016-09-14 VITALS — HR 78 | Ht 63.0 in | Wt 165.1 lb

## 2016-09-14 DIAGNOSIS — Z0000 Encounter for general adult medical examination without abnormal findings: Secondary | ICD-10-CM | POA: Diagnosis not present

## 2016-09-14 NOTE — Patient Instructions (Addendum)
Ms. Rollings , Thank you for taking time to come for your Medicare Wellness Visit. I appreciate your ongoing commitment to your health goals. Please review the following plan we discussed and let me know if I can assist you in the future.   A Tetanus is recommended every 10 years. Medicare covers a tetanus if you have a cut or wound; otherwise, there may be a charge. If you had not had a tetanus with pertusses, known as the Tdap, you can take this anytime.   Please try to bring a copy of your LW or HCPOA to the office to scan to chart   Deferring the shingrix until next year Shingrix is a vaccine for the prevention of Shingles in Adults 50 and older.  If you are on Medicare, you can request a prescription from your doctor to be filled at a pharmacy.  Please check with your benefits regarding applicable copays or out of pocket expenses.  The Shingrix is given in 2 vaccines approx 8 weeks apart. You must receive the 2nd dose prior to 6 months from receipt of the first.   These are the goals we discussed: Goals    . Weight (lb) < 150 lb (68 kg)          Cutting back on meals  Check out  online nutrition programs as GumSearch.nl and http://vang.com/; fit10m; Look for foods with "whole" wheat; bran; oatmeal etc Shot at the [employer] season for fresher choices  Watch for "hydrogenated" on the label of oils which are trans-fats.  Watch for "high fructose corn syrup" in snacks, yogurt or ketchup  Meats have less marbling; bright colored fruits and vegetables;  Canned; dump out liquid and wash vegetables. Be mindful of what we are eating  Portion control is essential to a health weight! Sit down; take a break and enjoy your meal; take smaller bites; put the fork down between bites;  It takes 20 minutes to get full; so check in with your fullness cues and stop eating when you start to fill full              This is a list of the screening recommended for you and due  dates:  Health Maintenance  Topic Date Due  . Tetanus Vaccine  05/28/2016  . Mammogram  10/23/2016  . Flu Shot  11/21/2016  . Colon Cancer Screening  09/03/2019  . DEXA scan (bone density measurement)  Completed  .  Hepatitis C: One time screening is recommended by Center for Disease Control  (CDC) for  adults born from [birth year]through 1963.   Completed  . Pneumonia vaccines  Completed    Health Maintenance, Female Adopting a healthy lifestyle and getting preventive care can go a long way to promote health and wellness. Talk with your health care provider about what schedule of regular examinations is right for you. This is a good chance for you to check in with your provider about disease prevention and staying healthy. In between checkups, there are plenty of things you can do on your own. Experts have done a lot of research about which lifestyle changes and preventive measures are most likely to keep you healthy. Ask your health care provider for more information. Weight and diet Eat a healthy diet  Be sure to include plenty of vegetables, fruits, low-fat dairy products, and lean protein.  Do not eat a lot of foods high in solid fats, added sugars, or salt.  Get regular exercise. This  is one of the most important things you can do for your health.  Most adults should exercise for at least 150 minutes each week. The exercise should increase your heart rate and make you sweat (moderate-intensity exercise).  Most adults should also do strengthening exercises at least twice a week. This is in addition to the moderate-intensity exercise. Maintain a healthy weight  Body mass index (BMI) is a measurement that can be used to identify possible weight problems. It estimates body fat based on height and weight. Your health care provider can help determine your BMI and help you achieve or maintain a healthy weight.  For females 56 years of age and older:  A BMI below 18.5 is considered  underweight.  A BMI of 18.5 to 24.9 is normal.  A BMI of 25 to 29.9 is considered overweight.  A BMI of 30 and above is considered obese. Watch levels of cholesterol and blood lipids  You should start having your blood tested for lipids and cholesterol at 69 years of age, then have this test every 5 years.  You may need to have your cholesterol levels checked more often if:  Your lipid or cholesterol levels are high.  You are older than 69 years of age.  You are at high risk for heart disease. Cancer screening Lung Cancer  Lung cancer screening is recommended for adults 65-73 years old who are at high risk for lung cancer because of a history of smoking.  A yearly low-dose CT scan of the lungs is recommended for people who:  Currently smoke.  Have quit within the past 15 years.  Have at least a 30-pack-year history of smoking. A pack year is smoking an average of one pack of cigarettes a day for 1 year.  Yearly screening should continue until it has been 15 years since you quit.  Yearly screening should stop if you develop a health problem that would prevent you from having lung cancer treatment. Breast Cancer  Practice breast self-awareness. This means understanding how your breasts normally appear and feel.  It also means doing regular breast self-exams. Let your health care provider know about any changes, no matter how small.  If you are in your 20s or 30s, you should have a clinical breast exam (CBE) by a health care provider every 1-3 years as part of a regular health exam.  If you are 64 or older, have a CBE every year. Also consider having a breast X-ray (mammogram) every year.  If you have a family history of breast cancer, talk to your health care provider about genetic screening.  If you are at high risk for breast cancer, talk to your health care provider about having an MRI and a mammogram every year.  Breast cancer gene (BRCA) assessment is recommended  for women who have family members with BRCA-related cancers. BRCA-related cancers include:  Breast.  Ovarian.  Tubal.  Peritoneal cancers.  Results of the assessment will determine the need for genetic counseling and BRCA1 and BRCA2 testing. Cervical Cancer  Your health care provider may recommend that you be screened regularly for cancer of the pelvic organs (ovaries, uterus, and vagina). This screening involves a pelvic examination, including checking for microscopic changes to the surface of your cervix (Pap test). You may be encouraged to have this screening done every 3 years, beginning at age 16.  For women ages 60-65, health care providers may recommend pelvic exams and Pap testing every 3 years, or they may recommend  the Pap and pelvic exam, combined with testing for human papilloma virus (HPV), every 5 years. Some types of HPV increase your risk of cervical cancer. Testing for HPV may also be done on women of any age with unclear Pap test results.  Other health care providers may not recommend any screening for nonpregnant women who are considered low risk for pelvic cancer and who do not have symptoms. Ask your health care provider if a screening pelvic exam is right for you.  If you have had past treatment for cervical cancer or a condition that could lead to cancer, you need Pap tests and screening for cancer for at least 20 years after your treatment. If Pap tests have been discontinued, your risk factors (such as having a new sexual partner) need to be reassessed to determine if screening should resume. Some women have medical problems that increase the chance of getting cervical cancer. In these cases, your health care provider may recommend more frequent screening and Pap tests. Colorectal Cancer  This type of cancer can be detected and often prevented.  Routine colorectal cancer screening usually begins at 69 years of age and continues through 69 years of age.  Your health  care provider may recommend screening at an earlier age if you have risk factors for colon cancer.  Your health care provider may also recommend using home test kits to check for hidden blood in the stool.  A small camera at the end of a tube can be used to examine your colon directly (sigmoidoscopy or colonoscopy). This is done to check for the earliest forms of colorectal cancer.  Routine screening usually begins at age 54.  Direct examination of the colon should be repeated every 5-10 years through 69 years of age. However, you may need to be screened more often if early forms of precancerous polyps or small growths are found. Skin Cancer  Check your skin from head to toe regularly.  Tell your health care provider about any new moles or changes in moles, especially if there is a change in a mole's shape or color.  Also tell your health care provider if you have a mole that is larger than the size of a pencil eraser.  Always use sunscreen. Apply sunscreen liberally and repeatedly throughout the day.  Protect yourself by wearing long sleeves, pants, a wide-brimmed hat, and sunglasses whenever you are outside. Heart disease, diabetes, and high blood pressure  High blood pressure causes heart disease and increases the risk of stroke. High blood pressure is more likely to develop in:  People who have blood pressure in the high end of the normal range (130-139/85-89 mm Hg).  People who are overweight or obese.  People who are African American.  If you are 28-88 years of age, have your blood pressure checked every 3-5 years. If you are 4 years of age or older, have your blood pressure checked every year. You should have your blood pressure measured twice-once when you are at a hospital or clinic, and once when you are not at a hospital or clinic. Record the average of the two measurements. To check your blood pressure when you are not at a hospital or clinic, you can use:  An automated  blood pressure machine at a pharmacy.  A home blood pressure monitor.  If you are between 40 years and 58 years old, ask your health care provider if you should take aspirin to prevent strokes.  Have regular diabetes screenings. This involves  taking a blood sample to check your fasting blood sugar level.  If you are at a normal weight and have a low risk for diabetes, have this test once every three years after 69 years of age.  If you are overweight and have a high risk for diabetes, consider being tested at a younger age or more often. Preventing infection Hepatitis B  If you have a higher risk for hepatitis B, you should be screened for this virus. You are considered at high risk for hepatitis B if:  You were born in a country where hepatitis B is common. Ask your health care provider which countries are considered high risk.  Your parents were born in a high-risk country, and you have not been immunized against hepatitis B (hepatitis B vaccine).  You have HIV or AIDS.  You use needles to inject street drugs.  You live with someone who has hepatitis B.  You have had sex with someone who has hepatitis B.  You get hemodialysis treatment.  You take certain medicines for conditions, including cancer, organ transplantation, and autoimmune conditions. Hepatitis C  Blood testing is recommended for:  Everyone born from [birth year] through 1963.  Anyone with known risk factors for hepatitis C. Sexually transmitted infections (STIs)  You should be screened for sexually transmitted infections (STIs) including gonorrhea and chlamydia if:  You are sexually active and are younger than 69 years of age.  You are older than 69 years of age and your health care provider tells you that you are at risk for this type of infection.  Your sexual activity has changed since you were last screened and you are at an increased risk for chlamydia or gonorrhea. Ask your health care provider if you are at  risk.  If you do not have HIV, but are at risk, it may be recommended that you take a prescription medicine daily to prevent HIV infection. This is called pre-exposure prophylaxis (PrEP). You are considered at risk if:  You are sexually active and do not regularly use condoms or know the HIV status of your partner(s).  You take drugs by injection.  You are sexually active with a partner who has HIV. Talk with your health care provider about whether you are at high risk of being infected with HIV. If you choose to begin PrEP, you should first be tested for HIV. You should then be tested every 3 months for as long as you are taking PrEP. Pregnancy  If you are premenopausal and you may become pregnant, ask your health care provider about preconception counseling.  If you may become pregnant, take 400 to 800 micrograms (mcg) of folic acid every day.  If you want to prevent pregnancy, talk to your health care provider about birth control (contraception). Osteoporosis and menopause  Osteoporosis is a disease in which the bones lose minerals and strength with aging. This can result in serious bone fractures. Your risk for osteoporosis can be identified using a bone density scan.  If you are 69 years of age or older, or if you are at risk for osteoporosis and fractures, ask your health care provider if you should be screened.  Ask your health care provider whether you should take a calcium or vitamin D supplement to lower your risk for osteoporosis.  Menopause may have certain physical symptoms and risks.  Hormone replacement therapy may reduce some of these symptoms and risks. Talk to your health care provider about whether hormone replacement therapy is right  for you. Follow these instructions at home:  Schedule regular health, dental, and eye exams.  Stay current with your immunizations.  Do not use any tobacco products including cigarettes, chewing tobacco, or electronic  cigarettes.  If you are pregnant, do not drink alcohol.  If you are breastfeeding, limit how much and how often you drink alcohol.  Limit alcohol intake to no more than 1 drink per day for nonpregnant women. One drink equals 12 ounces of beer, 5 ounces of wine, or 1 ounces of hard liquor.  Do not use street drugs.  Do not share needles.  Ask your health care provider for help if you need support or information about quitting drugs.  Tell your health care provider if you often feel depressed.  Tell your health care provider if you have ever been abused or do not feel safe at home. This information is not intended to replace advice given to you by your health care provider. Make sure you discuss any questions you have with your health care provider. Document Released: 10/23/2010 Document Revised: 09/15/2015 Document Reviewed: 01/11/2015 Elsevier Interactive Patient Education  2017 [employer] Prevention in the Home Falls can cause injuries and can affect people from all age groups. There are many simple things that you can do to make your home safe and to help prevent falls. What can I do on the outside of my home?  Regularly repair the edges of walkways and driveways and fix any cracks.  Remove high doorway thresholds.  Trim any shrubbery on the main path into your home.  Use bright outdoor lighting.  Clear walkways of debris and clutter, including tools and rocks.  Regularly check that handrails are securely fastened and in good repair. Both sides of any steps should have handrails.  Install guardrails along the edges of any raised decks or porches.  Have leaves, snow, and ice cleared regularly.  Use sand or salt on walkways during winter months.  In the garage, clean up any spills right away, including grease or oil spills. What can I do in the bathroom?  Use night lights.  Install grab bars by the toilet and in the tub and shower. Do not use towel bars as  grab bars.  Use non-skid mats or decals on the floor of the tub or shower.  If you need to sit down while you are in the shower, use a plastic, non-slip stool.  Keep the floor dry. Immediately clean up any water that spills on the floor.  Remove soap buildup in the tub or shower on a regular basis.  Attach bath mats securely with double-sided non-slip rug tape.  Remove throw rugs and other tripping hazards from the floor. What can I do in the bedroom?  Use night lights.  Make sure that a bedside light is easy to reach.  Do not use oversized bedding that drapes onto the floor.  Have a firm chair that has side arms to use for getting dressed.  Remove throw rugs and other tripping hazards from the floor. What can I do in the kitchen?  Clean up any spills right away.  Avoid walking on wet floors.  Place frequently used items in easy-to-reach places.  If you need to reach for something above you, use a sturdy step stool that has a grab bar.  Keep electrical cables out of the way.  Do not use floor polish or wax that makes floors slippery. If you have to use wax, make  sure that it is non-skid floor wax.  Remove throw rugs and other tripping hazards from the floor. What can I do in the stairways?  Do not leave any items on the stairs.  Make sure that there are handrails on both sides of the stairs. Fix handrails that are broken or loose. Make sure that handrails are as long as the stairways.  Check any carpeting to make sure that it is firmly attached to the stairs. Fix any carpet that is loose or worn.  Avoid having throw rugs at the top or bottom of stairways, or secure the rugs with carpet tape to prevent them from moving.  Make sure that you have a light switch at the top of the stairs and the bottom of the stairs. If you do not have them, have them installed. What are some other fall prevention tips?  Wear closed-toe shoes that fit well and support your feet. Wear  shoes that have rubber soles or low heels.  When you use a stepladder, make sure that it is completely opened and that the sides are firmly locked. Have someone hold the ladder while you are using it. Do not climb a closed stepladder.  Add color or contrast paint or tape to grab bars and handrails in your home. Place contrasting color strips on the first and last steps.  Use mobility aids as needed, such as canes, walkers, scooters, and crutches.  Turn on lights if it is dark. Replace any light bulbs that burn out.  Set up furniture so that there are clear paths. Keep the furniture in the same spot.  Fix any uneven floor surfaces.  Choose a carpet design that does not hide the edge of steps of a stairway.  Be aware of any and all pets.  Review your medicines with your healthcare provider. Some medicines can cause dizziness or changes in blood pressure, which increase your risk of falling. Talk with your health care provider about other ways that you can decrease your risk of falls. This may include working with a physical therapist or trainer to improve your strength, balance, and endurance. This information is not intended to replace advice given to you by your health care provider. Make sure you discuss any questions you have with your health care provider. Document Released: 03/30/2002 Document Revised: 09/06/2015 Document Reviewed: 05/14/2014 Elsevier Interactive Patient Education  2017 [employer].

## 2016-09-14 NOTE — Progress Notes (Signed)
I have reviewed and agree with note, evaluation, plan.   Marvelous Woolford, MD  

## 2016-09-14 NOTE — Progress Notes (Signed)
Subjective:   Megan Booth is a 71 y.o. female who presents for an Initial Medicare Annual Wellness Visit.  The Patient was informed that the wellness visit is to identify future health risk and educate and initiate measures that can reduce risk for increased disease through the lifespan.    NO ROS; Medicare Wellness Visit Last seen 05/02  Describes health as good, fair or great? Good   Preventive Screening -Counseling & Management  Colonoscopy 03/2016- due 08/2019 ( 5 years )  Mammogram 10/2015/ due 10/25/2016  Dexa 03/2015  (-1.6) -GYN  follows bone density  (Tetanus due)  [facility] [place] [facility]/ she is on hormone replacement    Smoking history - never smoked  30 pack hx: Educated regarding LDCT if applicable with a 30 year hx Also educated regarding AAA for female tobacco users with smoking hx  Smokeless tobacco  Second Hand Smoke status; No Smokers in the home ETOH - one glass of wine  Medication adherence or issues? Good   RISK FACTORS Diet Drinking 90oz of water a day; no issues with BP  Last BP wnl in early May Oatmeal for lunch 1/2 sandwich Dinner; meat and salad or a meat or green or yellow veg Very limited sweets Diet x 2 months   Regular exercise x 2 per week Functional fitness; exercise class and weights and stretching Yoga on Thursday Walk 2 to 3 times a week x 2 miles 5 days a week x 45 min  Cardiac Risk Factors:  Advanced aged > 72 in men; >65 in women Hyperlipidemia - chol 168; HDL 45; LDL 90 and trig 202  Diabetes - 64  Family History - mother had HD; HTN; depression, DM, Dementia  Father had cancer  Obesity; trying to lose weight (29.1)   Fall risk no Given education on "Fall Prevention in the Home" for more safety tips the patient can apply as appropriate.  Long term goal is to "age in place" or undecided   Mobility of Functional changes this year? No   Mental Health:  Any emotional problems? Anxious, depressed,  irritable, sad or blue? no Denies feeling depressed or hopeless; voices pleasure in daily life How many social activities have you been engaged in within the last 2 weeks? no   Visual Acuity Screening   Right eye Left eye Both eyes  Without correction: 20/40 20/30 20/30   With correction:     Comments: Vision annually Dr. Marica Otter  No issues  Had lasik surgery   Hearing Screening Comments: No hearing issues    Activities of Daily Living - See functional screen   Cognitive testing; Ad8 score; 0 or less than 2  MMSE deferred or completed if AD8 + 2 issues  Advanced Directives - AD completed but not in chart  Requested a copy   Patient Care Team: Marin Olp, MD as PCP - General (Family Medicine)   Immunization History  Administered Date(s) Administered  . Influenza Split 01/25/2012  . Influenza, High Dose Seasonal PF 02/04/2015, 01/12/2016  . Influenza,inj,Quad PF,36+ Mos 01/19/2014  . Influenza-Unspecified 02/21/2013  . Pneumococcal Conjugate-13 04/14/2013  . Pneumococcal Polysaccharide-23 08/12/2014  . Td 05/28/2006  . Zoster 09/21/2011   Required Immunizations needed today  Screening test up to date or reviewed for plan of completion Health Maintenance Due  Topic Date Due  . TETANUS/TDAP  05/28/2016   Will take if she incurs a dirty laceration or puncture; Will check her Federal coverage to see if  it is covered   Cardiac Risk Factors include: advanced age (>56men, >32 women);dyslipidemia;family history of premature cardiovascular disease     Objective:    Today's Vitals   09/14/16 1058  Pulse: 78  SpO2: 97%  Weight: 165 lb 1 oz (74.9 kg)  Height: 5\' 3"  (1.6 m)   Body mass index is 29.24 kg/m.   Current Medications (verified) Outpatient Encounter Prescriptions as of 09/14/2016  Medication Sig  . atorvastatin (LIPITOR) 40 MG tablet TAKE 1 TABLET ONCE DAILY AT 6 PM.  . Biotin 5000 MCG CAPS Take 1 capsule by mouth.  . Cholecalciferol  (VITAMIN D3) 1000 units CAPS Take 1 capsule by mouth daily.  . citalopram (CELEXA) 10 MG tablet Take 1 tablet (10 mg total) by mouth daily.  Marland Kitchen esomeprazole (NEXIUM) 40 MG capsule TAKE 1 CAPSULE ONCE DAILY BEFORE BREAKFAST.  Marland Kitchen Estradiol-Estriol-Progesterone (BIEST/PROGESTERONE TD) Take 1 tablet by mouth 2 (two) times daily.  . famotidine (PEPCID) 20 MG tablet Take 20 mg by mouth as needed.   . furosemide (LASIX) 20 MG tablet Take 1 tablet (20 mg total) by mouth daily as needed for fluid or edema.  Marland Kitchen levothyroxine (SYNTHROID, LEVOTHROID) 50 MCG tablet TAKE 1 TABLET ONCE DAILY.  Marland Kitchen Phentermine-Topiramate 7.5-46 MG CP24 Take 1 capsule by mouth daily.  . Progesterone Micronized (PROGESTERONE, BULK,) POWD   . zolpidem (AMBIEN) 10 MG tablet TAKE 1/2 TABLET AT BEDTIME AS NEEDED FOR SLEEP.  Marland Kitchen valACYclovir (VALTREX) 1000 MG tablet Take 1 tablet (1,000 mg total) by mouth 3 (three) times daily. For 7 days for shingles. (Patient not taking: Reported on 09/14/2016)   No facility-administered encounter medications on file as of 09/14/2016.     Allergies (verified) Patient has no known allergies.   History: Past Medical History:  Diagnosis Date  . Amnesia, global, transient 2015   see hospital notes  . Anxiety   . Barrett esophagus    Dr. Lajoyce Corners  . Diverticulosis   . GERD (gastroesophageal reflux disease)   . Hx of colonic polyps    Dr. Lajoyce Corners   . Hyperlipidemia   . Hypothyroidism   . Internal hemorrhoid   . Leukoplakia, vulva   . Sleep apnea    Past Surgical History:  Procedure Laterality Date  . BREAST ENHANCEMENT SURGERY    . DILATION AND CURETTAGE OF UTERUS     x 2  . HYSTEROSCOPY    . LASIK     eye  . TUBAL LIGATION     Family History  Problem Relation Age of Onset  . Heart disease Mother        pacemaker  . Hypertension Mother   . Depression Mother   . Diabetes Mother   . Dementia Mother   . Cancer Father 6       lung, smoker  . Seizures Brother   . Colon cancer Neg Hx   .  Esophageal cancer Neg Hx   . Stomach cancer Neg Hx    Social History   Occupational History  . Judicial Assistant  [employer]   Social History Main Topics  . Smoking status: Never Smoker  . Smokeless tobacco: Never Used  . Alcohol use 0.6 oz/week    1 Glasses of wine per week  . Drug use: No  . Sexual activity: No     Comment: BTL    Tobacco Counseling Counseling given: Yes   Activities of Daily Living In your present state of health, do you have any difficulty performing the  following activities: 09/14/2016  Hearing? N  Vision? N  Difficulty concentrating or making decisions? N  Walking or climbing stairs? N  Dressing or bathing? N  Doing errands, shopping? N  Preparing Food and eating ? N  Using the Toilet? N  In the past six months, have you accidently leaked urine? N  Do you have problems with loss of bowel control? N  Managing your Medications? N  Managing your Finances? N  Housekeeping or managing your Housekeeping? N  Some recent data might be hidden    Immunizations and Health Maintenance Immunization History  Administered Date(s) Administered  . Influenza Split 01/25/2012  . Influenza, High Dose Seasonal PF 02/04/2015, 01/12/2016  . Influenza,inj,Quad PF,36+ Mos 01/19/2014  . Influenza-Unspecified 02/21/2013  . Pneumococcal Conjugate-13 04/14/2013  . Pneumococcal Polysaccharide-23 08/12/2014  . Td 05/28/2006  . Zoster 09/21/2011   Health Maintenance Due  Topic Date Due  . Samul Dada  05/28/2016    Patient Care Team: Marin Olp, MD as PCP - General (Family Medicine)  Indicate any recent Medical Services you may have received from other than [facility] providers in the past year (date may be approximate).     Assessment:   This is a routine wellness examination for [place].   Hearing/Vision screen  Visual Acuity Screening   Right eye Left eye Both eyes  Without correction: 20/40 20/30 20/30   With correction:     Comments: Vision  annually Dr. Marica Otter  No issues  Had lasik surgery   Hearing Screening Comments: No hearing issues   Dietary issues and exercise activities discussed: Current Exercise Habits: Structured exercise class, Time (Minutes): 45, Frequency (Times/Week): 5, Weekly Exercise (Minutes/Week): 225, Intensity: Moderate  Goals    . Weight (lb) < 150 lb (68 kg)          Cutting back on meals  Check out  online nutrition programs as GumSearch.nl and http://vang.com/; fit44me; Look for foods with "whole" wheat; bran; oatmeal etc Shot at the [employer] in season for fresher choices  Watch for "hydrogenated" on the label of oils which are trans-fats.  Watch for "high fructose corn syrup" in snacks, yogurt or ketchup  Meats have less marbling; bright colored fruits and vegetables;  Canned; dump out liquid and wash vegetables. Be mindful of what we are eating  Portion control is essential to a health weight! Sit down; take a break and enjoy your meal; take smaller bites; put the fork down between bites;  It takes 20 minutes to get full; so check in with your fullness cues and stop eating when you start to fill full             Depression Screen PHQ 2/9 Scores 09/14/2016 06/11/2016 09/21/2013  PHQ - 2 Score 0 0 1    Fall Risk Fall Risk  09/14/2016 06/11/2016 09/21/2013  Falls in the past year? No No No    Cognitive Function: MMSE - Mini Mental State Exam 09/14/2016  Not completed: (No Data)        Screening Tests Health Maintenance  Topic Date Due  . TETANUS/TDAP  05/28/2016  . MAMMOGRAM  10/23/2016  . INFLUENZA VACCINE  11/21/2016  . COLONOSCOPY  09/03/2019  . DEXA SCAN  Completed  . Hepatitis C Screening  Completed  . PNA vac Low Risk Adult  Completed      Plan:      PCP Notes  Health Maintenance Will check Federal plan for coverage of Tetanus  Eye check today; s/p  lasik procedure  Abnormal Screens  OD 20/40; but OU 20/30 /uses readers; eye exam annually    Referrals none  Patient concerns; weight loss Good weight loss program, motivation excellent   Nurse Concerns; none  Next PCP apt was seen in May    I have personally reviewed and noted the following in the patient's chart:   . Medical and social history . Use of alcohol, tobacco or illicit drugs  . Current medications and supplements . Functional ability and status . Nutritional status . Physical activity . Advanced directives . List of other physicians . Hospitalizations, surgeries, and ER visits in previous 12 months . Vitals . Screenings to include cognitive, depression, and falls . Referrals and appointments  In addition, I have reviewed and discussed with patient certain preventive protocols, quality metrics, and best practice recommendations. A written personalized care plan for preventive services as well as general preventive health recommendations were provided to patient.     Wynetta Fines, RN   09/14/2016

## 2016-10-01 ENCOUNTER — Ambulatory Visit (INDEPENDENT_AMBULATORY_CARE_PROVIDER_SITE_OTHER): Payer: Medicare Other | Admitting: *Deleted

## 2016-10-01 DIAGNOSIS — Z23 Encounter for immunization: Secondary | ICD-10-CM | POA: Diagnosis not present

## 2016-10-04 ENCOUNTER — Other Ambulatory Visit: Payer: Self-pay | Admitting: Family Medicine

## 2016-10-05 ENCOUNTER — Ambulatory Visit: Payer: Medicare Other | Admitting: Family Medicine

## 2016-10-15 ENCOUNTER — Encounter: Payer: Self-pay | Admitting: Family Medicine

## 2016-10-16 ENCOUNTER — Other Ambulatory Visit: Payer: Self-pay | Admitting: Family Medicine

## 2016-10-18 NOTE — Telephone Encounter (Signed)
I contacted the patient back through mychart to let her know I was removing it.

## 2016-10-26 DIAGNOSIS — Z1231 Encounter for screening mammogram for malignant neoplasm of breast: Secondary | ICD-10-CM | POA: Diagnosis not present

## 2016-10-26 LAB — HM MAMMOGRAPHY

## 2016-10-29 ENCOUNTER — Encounter: Payer: Self-pay | Admitting: Family Medicine

## 2016-11-02 ENCOUNTER — Other Ambulatory Visit: Payer: Self-pay | Admitting: Family Medicine

## 2016-11-26 ENCOUNTER — Other Ambulatory Visit: Payer: Self-pay | Admitting: Family Medicine

## 2016-12-29 DIAGNOSIS — M25562 Pain in left knee: Secondary | ICD-10-CM | POA: Diagnosis not present

## 2017-01-09 ENCOUNTER — Other Ambulatory Visit: Payer: Self-pay | Admitting: Family Medicine

## 2017-01-09 DIAGNOSIS — E669 Obesity, unspecified: Secondary | ICD-10-CM | POA: Diagnosis not present

## 2017-01-09 DIAGNOSIS — Z1211 Encounter for screening for malignant neoplasm of colon: Secondary | ICD-10-CM | POA: Diagnosis not present

## 2017-01-22 ENCOUNTER — Ambulatory Visit (INDEPENDENT_AMBULATORY_CARE_PROVIDER_SITE_OTHER): Payer: Medicare Other

## 2017-01-22 DIAGNOSIS — Z23 Encounter for immunization: Secondary | ICD-10-CM | POA: Diagnosis not present

## 2017-01-25 DIAGNOSIS — M5441 Lumbago with sciatica, right side: Secondary | ICD-10-CM | POA: Diagnosis not present

## 2017-03-11 ENCOUNTER — Other Ambulatory Visit: Payer: Self-pay | Admitting: Family Medicine

## 2017-03-15 ENCOUNTER — Other Ambulatory Visit: Payer: Self-pay | Admitting: Family Medicine

## 2017-03-25 DIAGNOSIS — M5441 Lumbago with sciatica, right side: Secondary | ICD-10-CM | POA: Diagnosis not present

## 2017-03-26 ENCOUNTER — Telehealth: Payer: Self-pay | Admitting: *Deleted

## 2017-03-26 NOTE — Telephone Encounter (Signed)
Copied from [place]. Topic: General - Other >> Mar 26, 2017  2:57 PM Carolyn Stare wrote:   Pt call to say she need an order to have a bone density at [facility] Her last one was 04/11/15   Would like a call back    [phone]

## 2017-03-26 NOTE — Telephone Encounter (Signed)
Dr. Hunter, please advise.  

## 2017-03-26 NOTE — Telephone Encounter (Signed)
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

## 2017-03-27 ENCOUNTER — Other Ambulatory Visit: Payer: Self-pay | Admitting: *Deleted

## 2017-03-27 DIAGNOSIS — M85851 Other specified disorders of bone density and structure, right thigh: Secondary | ICD-10-CM

## 2017-03-27 NOTE — Telephone Encounter (Signed)
Order has been placed.

## 2017-03-28 ENCOUNTER — Other Ambulatory Visit: Payer: Self-pay

## 2017-03-28 ENCOUNTER — Other Ambulatory Visit: Payer: Self-pay | Admitting: Family Medicine

## 2017-03-28 ENCOUNTER — Encounter: Payer: Self-pay | Admitting: Family Medicine

## 2017-03-28 ENCOUNTER — Ambulatory Visit (INDEPENDENT_AMBULATORY_CARE_PROVIDER_SITE_OTHER): Payer: Medicare Other | Admitting: Family Medicine

## 2017-03-28 VITALS — BP 143/95 | HR 86 | Ht 63.0 in | Wt 171.4 lb

## 2017-03-28 DIAGNOSIS — Z79890 Hormone replacement therapy (postmenopausal): Secondary | ICD-10-CM

## 2017-03-28 DIAGNOSIS — R030 Elevated blood-pressure reading, without diagnosis of hypertension: Secondary | ICD-10-CM

## 2017-03-28 DIAGNOSIS — E039 Hypothyroidism, unspecified: Secondary | ICD-10-CM

## 2017-03-28 DIAGNOSIS — M79644 Pain in right finger(s): Secondary | ICD-10-CM

## 2017-03-28 LAB — TSH: TSH: 1.52 u[IU]/mL (ref 0.35–4.50)

## 2017-03-28 LAB — T3, FREE: T3, Free: 3.1 pg/mL (ref 2.3–4.2)

## 2017-03-28 LAB — T4, FREE: Free T4: 1.12 ng/dL (ref 0.60–1.60)

## 2017-03-28 MED ORDER — LEVOTHYROXINE SODIUM 25 MCG PO TABS: 25.0000 ug | ORAL_TABLET | Freq: Every day | ORAL | 1 refills | Status: DC

## 2017-03-28 NOTE — Assessment & Plan Note (Addendum)
Check TSH, T3, and T4 today.  Will consider halfing dose if those parameters are within normal limits.

## 2017-03-28 NOTE — Progress Notes (Signed)
    Subjective:  Megan AMBROCIO is a 71 y.o. female who presents today with a chief complaint of finger Booth.   HPI:  Finger Booth, acute issue Patient injured her right ring finger 4 days ago while putting up her Christmas tree.  Immediately noticed Booth and bleeding to the area.  She is able to get the bleeding to stop.  She placed Neosporin and a bandage on the area.  Booth has significantly improved since then, however she wanted to make sure that it was healing normally.  No other treatments tried.  Had a tetanus shot 6 months ago.  No erythema to the area.  No drainage.  Thyroid replacement, established problem, Patient on chronic thyroid replacement to help with her hot flashes.  She has been on this for several years.  She would like to stop her Synthroid as she has been started on other medications to help her with this.  ROS: Per HPI  PMH: Smoking history reviewed.  Never smoker.  Objective:  Physical Exam: BP (!) 143/95   Pulse 86   Ht 5\' 3"  (1.6 m)   Wt 171 lb 6.4 oz (77.7 kg)   SpO2 99%   BMI 30.36 kg/m   Gen: NAD, resting comfortably CV: RRR with no murmurs appreciated Pulm: NWOB, CTAB with no crackles, wheezes, or rhonchi MSK:  -Right ring finger: Approximately 8 x 3 mm abrasion noted to volar aspect of distal phalanx.  Surrounding tissue white.  No surrounding erythema.  No purulence or drainage.  Capillary refill normal distally.  Sensation light touch intact distally.  Assessment/Plan:  Long-term current use of thyroid hormone replacement therapy Check TSH, T3, and T4 today.  Will consider halfing dose if those parameters are within normal limits.  Finger Booth Wound appears to be healing normally.  Placed bacitracin and gauze on the wound today.  Instructed patient to keep area clean and dry.  Discussed normal process of healing and reasons to return to care.  Elevated blood pressure reading Mildly elevated today.  Typically well controlled.  Likely elevated  in setting of acute Booth.  Advised patient continue tracking at home with goal blood pressure 140/90 or less.  Megan Booth. Megan Pain, MD 03/28/2017 10:01 AM

## 2017-03-28 NOTE — Progress Notes (Signed)
Labs all normal. We can decrease her synthroid level by half - I will send a new Rx to her pharmacy. She should follow up in 4-6 weeks to recheck and discuss further dose de-escalation.  Algis Greenhouse. Jerline Pain, MD 03/28/2017 2:21 PM

## 2017-03-28 NOTE — Patient Instructions (Signed)
We will check blood work today.   Laceration Care, Adult A laceration is a cut that goes through all layers of the skin. The cut also goes into the tissue that is right under the skin. Some cuts heal on their own. Others need to be closed with stitches (sutures), staples, skin adhesive strips, or wound glue. Taking care of your cut lowers your risk of infection and helps your cut to heal better. How to take care of your cut For stitches or staples:  Keep the wound clean and dry.  If you were given a bandage (dressing), you should change it at least one time per day or as told by your doctor. You should also change it if it gets wet or dirty.  Keep the wound completely dry for the first 24 hours or as told by your doctor. After that time, you may take a shower or a bath. However, make sure that the wound is not soaked in water until after the stitches or staples have been removed.  Clean the wound one time each day or as told by your doctor: ? Wash the wound with soap and water. ? Rinse the wound with water until all of the soap comes off. ? Pat the wound dry with a clean towel. Do not rub the wound.  After you clean the wound, put a thin layer of antibiotic ointment on it as told by your doctor. This ointment: ? Helps to prevent infection. ? Keeps the bandage from sticking to the wound.  Have your stitches or staples removed as told by your doctor. If your doctor used skin adhesive strips:  Keep the wound clean and dry.  If you were given a bandage, you should change it at least one time per day or as told by your doctor. You should also change it if it gets dirty or wet.  Do not get the skin adhesive strips wet. You can take a shower or a bath, but be careful to keep the wound dry.  If the wound gets wet, pat it dry with a clean towel. Do not rub the wound.  Skin adhesive strips fall off on their own. You can trim the strips as the wound heals. Do not remove any strips that are  still stuck to the wound. They will fall off after a while. If your doctor used wound glue:  Try to keep your wound dry, but you may briefly wet it in the shower or bath. Do not soak the wound in water, such as by swimming.  After you take a shower or a bath, gently pat the wound dry with a clean towel. Do not rub the wound.  Do not do any activities that will make you really sweaty until the skin glue has fallen off on its own.  Do not apply liquid, cream, or ointment medicine to your wound while the skin glue is still on.  If you were given a bandage, you should change it at least one time per day or as told by your doctor. You should also change it if it gets dirty or wet.  If a bandage is placed over the wound, do not let the tape for the bandage touch the skin glue.  Do not pick at the glue. The skin glue usually stays on for 5-10 days. Then, it falls off of the skin. General Instructions  To help prevent scarring, make sure to cover your wound with sunscreen whenever you are outside after stitches  are removed, after adhesive strips are removed, or when wound glue stays in place and the wound is healed. Make sure to wear a sunscreen of at least 30 SPF.  Take over-the-counter and prescription medicines only as told by your doctor.  If you were given antibiotic medicine or ointment, take or apply it as told by your doctor. Do not stop using the antibiotic even if your wound is getting better.  Do not scratch or pick at the wound.  Keep all follow-up visits as told by your doctor. This is important.  Check your wound every day for signs of infection. Watch for: ? Redness, swelling, or pain. ? Fluid, blood, or pus.  Raise (elevate) the injured area above the level of your heart while you are sitting or lying down, if possible. Get help if:  You got a tetanus shot and you have any of these problems at the injection site: ? Swelling. ? Very bad  pain. ? Redness. ? Bleeding.  You have a fever.  A wound that was closed breaks open.  You notice a bad smell coming from your wound or your bandage.  You notice something coming out of the wound, such as wood or glass.  Medicine does not help your pain.  You have more redness, swelling, or pain at the site of your wound.  You have fluid, blood, or pus coming from your wound.  You notice a change in the color of your skin near your wound.  You need to change the bandage often because fluid, blood, or pus is coming from the wound.  You start to have a new rash.  You start to have numbness around the wound. Get help right away if:  You have very bad swelling around the wound.  Your pain suddenly gets worse and is very bad.  You notice painful lumps near the wound or on skin that is anywhere on your body.  You have a red streak going away from your wound.  The wound is on your hand or foot and you cannot move a finger or toe like you usually can.  The wound is on your hand or foot and you notice that your fingers or toes look pale or bluish. This information is not intended to replace advice given to you by your health care provider. Make sure you discuss any questions you have with your health care provider. Document Released: 09/26/2007 Document Revised: 09/15/2015 Document Reviewed: 04/05/2014 Elsevier Interactive Patient Education  [employer].

## 2017-03-29 ENCOUNTER — Ambulatory Visit: Payer: Medicare Other | Admitting: Adult Health

## 2017-03-30 DIAGNOSIS — M5441 Lumbago with sciatica, right side: Secondary | ICD-10-CM | POA: Diagnosis not present

## 2017-04-04 DIAGNOSIS — M5441 Lumbago with sciatica, right side: Secondary | ICD-10-CM | POA: Diagnosis not present

## 2017-04-04 DIAGNOSIS — M722 Plantar fascial fibromatosis: Secondary | ICD-10-CM | POA: Diagnosis not present

## 2017-04-08 ENCOUNTER — Other Ambulatory Visit: Payer: Self-pay | Admitting: Orthopedic Surgery

## 2017-04-08 DIAGNOSIS — M722 Plantar fascial fibromatosis: Secondary | ICD-10-CM

## 2017-04-11 DIAGNOSIS — M8589 Other specified disorders of bone density and structure, multiple sites: Secondary | ICD-10-CM | POA: Diagnosis not present

## 2017-04-11 LAB — HM DEXA SCAN

## 2017-04-21 ENCOUNTER — Emergency Department (HOSPITAL_BASED_OUTPATIENT_CLINIC_OR_DEPARTMENT_OTHER)
Admission: EM | Admit: 2017-04-21 | Discharge: 2017-04-21 | Discharge status: Against Medical Advice | Payer: Medicare Other | Attending: Emergency Medicine | Admitting: Emergency Medicine

## 2017-04-21 ENCOUNTER — Encounter (HOSPITAL_BASED_OUTPATIENT_CLINIC_OR_DEPARTMENT_OTHER): Payer: Self-pay | Admitting: Emergency Medicine

## 2017-04-21 ENCOUNTER — Other Ambulatory Visit: Payer: Self-pay

## 2017-04-21 DIAGNOSIS — K219 Gastro-esophageal reflux disease without esophagitis: Secondary | ICD-10-CM | POA: Insufficient documentation

## 2017-04-21 DIAGNOSIS — Z5321 Procedure and treatment not carried out due to patient leaving prior to being seen by health care provider: Secondary | ICD-10-CM | POA: Insufficient documentation

## 2017-04-21 NOTE — ED Triage Notes (Signed)
Patient reports indigestion since noon.  Reports taking nexium and pepcid.  States that she has a burning in her chest and feels like she needs to throw up.  Denies shortness of breath.

## 2017-04-24 ENCOUNTER — Ambulatory Visit
Admission: RE | Admit: 2017-04-24 | Discharge: 2017-04-24 | Disposition: A | Discharge status: Home / Self Care | Payer: Medicare Other | Source: Ambulatory Visit | Attending: Orthopedic Surgery | Admitting: Orthopedic Surgery

## 2017-04-24 ENCOUNTER — Ambulatory Visit
Admission: RE | Admit: 2017-04-24 | Discharge: 2017-04-24 | Disposition: A | Discharge status: Home / Self Care | Payer: Self-pay | Source: Ambulatory Visit | Attending: Orthopedic Surgery | Admitting: Orthopedic Surgery

## 2017-04-24 ENCOUNTER — Other Ambulatory Visit: Payer: Self-pay | Admitting: Orthopedic Surgery

## 2017-04-24 ENCOUNTER — Encounter: Payer: Self-pay | Admitting: Family Medicine

## 2017-04-24 VITALS — BP 127/76 | HR 62

## 2017-04-24 DIAGNOSIS — M5441 Lumbago with sciatica, right side: Secondary | ICD-10-CM

## 2017-04-24 DIAGNOSIS — M722 Plantar fascial fibromatosis: Secondary | ICD-10-CM

## 2017-04-24 DIAGNOSIS — M5126 Other intervertebral disc displacement, lumbar region: Secondary | ICD-10-CM | POA: Diagnosis not present

## 2017-04-24 MED ORDER — HYDROCODONE-ACETAMINOPHEN 5-325 MG PO TABS: 1.0000 | ORAL_TABLET | Freq: Once | ORAL | Status: DC

## 2017-04-24 MED ORDER — IOPAMIDOL (ISOVUE-M 200) INJECTION 41%
15.0000 mL | Freq: Once | INTRAMUSCULAR | Status: AC
  Administered 2017-04-24: 15 mL via INTRATHECAL

## 2017-04-24 MED ORDER — HYDROCODONE-ACETAMINOPHEN 5-325 MG PO TABS
1.0000 | ORAL_TABLET | Freq: Once | ORAL | Status: AC
  Administered 2017-04-24: 1 via ORAL

## 2017-04-24 MED ORDER — DIAZEPAM 5 MG PO TABS
5.0000 mg | ORAL_TABLET | Freq: Once | ORAL | Status: AC
  Administered 2017-04-24: 5 mg via ORAL

## 2017-04-24 NOTE — Discharge Instructions (Signed)
Myelogram Discharge Instructions  1. Go home and rest quietly for the next 24 hours.  It is important to lie flat for the next 24 hours.  Get up only to go to the restroom.  You may lie in the bed or on a couch on your back, your stomach, your left side or your right side.  You may have one pillow under your head.  You may have pillows between your knees while you are on your side or under your knees while you are on your back.  2. DO NOT drive today.  Recline the seat as far back as it will go, while still wearing your seat belt, on the way home.  3. You may get up to go to the bathroom as needed.  You may sit up for 10 minutes to eat.  You may resume your normal diet and medications unless otherwise indicated.  Drink lots of extra fluids today and tomorrow.  4. The incidence of headache, nausea, or vomiting is about 5% (one in 20 patients).  If you develop a headache, lie flat and drink plenty of fluids until the headache goes away.  Caffeinated beverages may be helpful.  If you develop severe nausea and vomiting or a headache that does not go away with flat bed rest, call [phone].  5. You may resume normal activities after your 24 hours of bed rest is over; however, do not exert yourself strongly or do any heavy lifting tomorrow. If when you get up you have a headache when standing, go back to bed and force fluids for another 24 hours.  6. Call your physician for a follow-up appointment.  The results of your myelogram will be sent directly to your physician by the following day.  7. If you have any questions or if complications develop after you arrive home, please call [phone].  Discharge instructions have been explained to the patient.  The patient, or the person responsible for the patient, fully understands these instructions  YOU MAY RESTART YOUR CELEXA, PHENTERMINE AND TRAMADOL TOMORROW 04/25/2017 AT 09:30AM

## 2017-04-29 ENCOUNTER — Ambulatory Visit (INDEPENDENT_AMBULATORY_CARE_PROVIDER_SITE_OTHER): Payer: Medicare Other | Admitting: Family Medicine

## 2017-04-29 ENCOUNTER — Encounter: Payer: Self-pay | Admitting: Family Medicine

## 2017-04-29 VITALS — BP 122/70 | HR 84 | Temp 97.4°F | Ht 63.0 in | Wt 169.0 lb

## 2017-04-29 DIAGNOSIS — E785 Hyperlipidemia, unspecified: Secondary | ICD-10-CM

## 2017-04-29 DIAGNOSIS — K219 Gastro-esophageal reflux disease without esophagitis: Secondary | ICD-10-CM | POA: Diagnosis not present

## 2017-04-29 DIAGNOSIS — E039 Hypothyroidism, unspecified: Secondary | ICD-10-CM | POA: Diagnosis not present

## 2017-04-29 LAB — CBC
HCT: 42.3 % (ref 36.0–46.0)
Hemoglobin: 14 g/dL (ref 12.0–15.0)
MCHC: 33.1 g/dL (ref 30.0–36.0)
MCV: 95.3 fl (ref 78.0–100.0)
Platelets: 314 10*3/uL (ref 150.0–400.0)
RBC: 4.44 Mil/uL (ref 3.87–5.11)
RDW: 12.9 % (ref 11.5–15.5)
WBC: 9.5 10*3/uL (ref 4.0–10.5)

## 2017-04-29 LAB — COMPREHENSIVE METABOLIC PANEL
ALBUMIN: 4.3 g/dL (ref 3.5–5.2)
ALT: 13 U/L (ref 0–35)
AST: 14 U/L (ref 0–37)
Alkaline Phosphatase: 61 U/L (ref 39–117)
BUN: 18 mg/dL (ref 6–23)
CHLORIDE: 107 meq/L (ref 96–112)
CO2: 23 meq/L (ref 19–32)
CREATININE: 1.2 mg/dL (ref 0.40–1.20)
Calcium: 9.4 mg/dL (ref 8.4–10.5)
GFR: 47.27 mL/min — ABNORMAL LOW (ref >60.00)
Glucose, Bld: 77 mg/dL (ref 70–99)
Potassium: 4.1 mEq/L (ref 3.5–5.1)
Sodium: 140 mEq/L (ref 135–145)
Total Bilirubin: 0.4 mg/dL (ref 0.2–1.2)
Total Protein: 6.7 g/dL (ref 6.0–8.3)

## 2017-04-29 LAB — TSH: TSH: 2.45 u[IU]/mL (ref 0.35–4.50)

## 2017-04-29 LAB — LDL CHOLESTEROL, DIRECT: LDL DIRECT: 102 mg/dL

## 2017-04-29 NOTE — Assessment & Plan Note (Signed)
S: Patient sen April last year to help with weight loss- she has gained 5 lbs unfortunately since that time. We did do a short course of phentermine a that time  She was recently seen by Dr. Jerline Pain- her levothyroxine was cut in half to 25 mcg from 50 mcg about a month ago. She has not had additional weight gain since that time- in fact has lost 2 lbs.  Lab Results  Component Value Date   TSH 2.45 04/29/2017  A/P: TSH today still in normal range- she states she was on levothyroxine primarily for hot flashes- we will monitor at next visit and consider further reducing dose/stopping dose with 6--8 week repeat

## 2017-04-29 NOTE — Patient Instructions (Addendum)
Make sure to have your annual wellness visit scheduled at the [facility] [place] [facility] location now before you leave. Could also schedule a visit with me the same day to check in on thyroid if you want.   Let us know if you have chest pain particularly while still on the nexium.  Consider shingrix to reduce your risk of shingles even further if you can find it at the pharmacy  Please stop by lab before you go

## 2017-04-29 NOTE — Assessment & Plan Note (Signed)
S: very mildly poorly controlled on atorvastatin 40mg . No myalgias.  Lab Results  Component Value Date   CHOL 168 11/16/2015   HDL 45.60 11/16/2015   LDLCALC 101 (H) 09/15/2013   LDLDIRECT 102.0 04/29/2017   TRIG 202.0 (H) 11/16/2015   CHOLHDL 4 11/16/2015   A/P: we could increase dose but instead would like patient to focus on getting weight back down and can recheck at later date. Goal LDL at least under 100 and she is at 102 so very close

## 2017-04-29 NOTE — Progress Notes (Signed)
Subjective:  Megan Booth is a 72 y.o. year old very pleasant female patient who presents for/with See problem oriented charting ROS- no increased edema. Did have one episode of chest pain resolving on reflux medication, no shortness of breaht. No fever or chills.    Past Medical History-  Patient Active Problem List   Diagnosis Date Noted  . Amnesia, global, transient     Priority: High  . Osteopenia 04/15/2015    Priority: Medium  . Hormone replacement therapy 08/12/2014    Priority: Medium  . Insomnia 08/12/2014    Priority: Medium  . Hyperlipidemia     Priority: Medium  . Sleep apnea     Priority: Medium  . Anxiety     Priority: Medium  . Edema 08/12/2014    Priority: Low  . GERD (gastroesophageal reflux disease)     Priority: Low  . Hx of colonic polyps     Priority: Low  . Leukoplakia, vulva     Priority: Low  . B12 deficiency 08/03/2011    Priority: Low  . Long-term current use of thyroid hormone replacement therapy 03/28/2017  . Overweight 06/11/2016  . Maxillary sinusitis, acute 04/27/2015  . Hypothyroidism     Medications- reviewed and updated Current Outpatient Medications  Medication Sig Dispense Refill  . atorvastatin (LIPITOR) 40 MG tablet TAKE 1 TABLET ONCE DAILY AT 6 PM. 90 tablet 1  . Biotin 5 MG/ML LIQD Take 5,000 mcg by mouth daily as needed.     . Cholecalciferol (VITAMIN D3) 1000 units CAPS Take 1 capsule by mouth daily.    . citalopram (CELEXA) 10 MG tablet TAKE 1 TABLET ONCE DAILY. 90 tablet 1  . esomeprazole (NEXIUM) 40 MG capsule TAKE 1 CAPSULE ONCE DAILY BEFORE BREAKFAST. 90 capsule 0  . Estradiol-Estriol-Progesterone (BIEST/PROGESTERONE TD) Take 1 tablet by mouth 2 (two) times daily.    . famotidine (PEPCID) 20 MG tablet Take 20 mg by mouth daily.     . furosemide (LASIX) 20 MG tablet Take 1 tablet (20 mg total) by mouth daily as needed for fluid or edema. 90 tablet 1  . levothyroxine (SYNTHROID, LEVOTHROID) 25 MCG tablet Take 1 tablet  (25 mcg total) by mouth daily. 30 tablet 1  . naproxen sodium (ALEVE) 220 MG tablet Take 220 mg by mouth daily as needed.     . zolpidem (AMBIEN) 10 MG tablet TAKE 1/2 TABLET AT BEDTIME AS NEEDED FOR SLEEP. 15 tablet 2   No current facility-administered medications for this visit.     Objective: BP 122/70 (BP Location: Left Arm, Patient Position: Sitting, Cuff Size: Large)   Pulse 84   Temp (!) 97.4 F (36.3 C) (Oral)   Ht 5\' 3"  (1.6 m)   Wt 169 lb (76.7 kg)   SpO2 100%   BMI 29.94 kg/m  Gen: NAD, resting comfortably No thyromegaly CV: RRR no murmurs rubs or gallops Lungs: CTAB no crackles, wheeze, rhonchi Abdomen: soft/nontender/nondistended/normal bowel sounds. No rebound or guarding.  Ext: trace edema Skin: warm, dry  Assessment/Plan:  Other issues 1. Has had back issue- MRI, myelogram. Later had plantar fasciitis. Had injections for the for foot after PO trial which did not help. Taking aleve for the back.  2. States preferred to be on levothyroxine for hot flashes but would like to get off if able  Hypothyroidism S: Patient sen April last year to help with weight loss- she has gained 5 lbs unfortunately since that time. We did do a short course of  phentermine a that time  She was recently seen by Dr. Jerline Pain- her levothyroxine was cut in half to 25 mcg from 50 mcg about a month ago. She has not had additional weight gain since that time- in fact has lost 2 lbs.  Lab Results  Component Value Date   TSH 2.45 04/29/2017  A/P: TSH today still in normal range- she states she was on levothyroxine primarily for hot flashes- we will monitor at next visit and consider further reducing dose/stopping dose with 6--8 week repeat  GERD (gastroesophageal reflux disease) S: went to ER for chest pain on day before NYE- was having chest pain. EKG was reassuring in ER then placed back in waiting room- she decided to go home- had forgotten her Nexium and once she took that along with Pepcid  symptoms. She denies exertional chest pain at any point A/P: we discussed importance of compliance with medication to help her with relief of symptoms. Would love to reduce PPI dose but considering still occasionally uses Pepcid for breakthrough will hold off.   Hyperlipidemia S: very mildly poorly controlled on atorvastatin 40mg . No myalgias.  Lab Results  Component Value Date   CHOL 168 11/16/2015   HDL 45.60 11/16/2015   LDLCALC 101 (H) 09/15/2013   LDLDIRECT 102.0 04/29/2017   TRIG 202.0 (H) 11/16/2015   CHOLHDL 4 11/16/2015   A/P: we could increase dose but instead would like patient to focus on getting weight back down and can recheck at later date. Goal LDL at least under 100 and she is at 102 so very close   Future Appointments  Date Time Provider [place]  09/17/2017  2:00 PM Williemae Area, RN [facility]  09/17/2017  2:45 PM Yong Channel Brayton Mars, MD [facility]  follow up at time of AWV  Hyperlipidemia, unspecified hyperlipidemia type - Plan: CBC, Comprehensive metabolic panel, LDL cholesterol, direct, TSH  Hypothyroidism, unspecified type - Plan: TSH  Return precautions advised.  Garret Reddish, MD

## 2017-04-29 NOTE — Assessment & Plan Note (Signed)
S: went to ER for chest pain on day before NYE- was having chest pain. EKG was reassuring in ER then placed back in waiting room- she decided to go home- had forgotten her Nexium and once she took that along with Pepcid symptoms. She denies exertional chest pain at any point A/P: we discussed importance of compliance with medication to help her with relief of symptoms. Would love to reduce PPI dose but considering still occasionally uses Pepcid for breakthrough will hold off.

## 2017-05-03 ENCOUNTER — Encounter: Payer: Self-pay | Admitting: Family Medicine

## 2017-05-06 ENCOUNTER — Encounter: Payer: Self-pay | Admitting: Family Medicine

## 2017-05-06 DIAGNOSIS — M545 Low back pain: Secondary | ICD-10-CM | POA: Diagnosis not present

## 2017-05-13 ENCOUNTER — Other Ambulatory Visit: Payer: Self-pay | Admitting: Family Medicine

## 2017-06-01 ENCOUNTER — Encounter: Payer: Self-pay | Admitting: Family Medicine

## 2017-06-03 ENCOUNTER — Encounter: Payer: Self-pay | Admitting: Family Medicine

## 2017-06-03 DIAGNOSIS — M79672 Pain in left foot: Secondary | ICD-10-CM | POA: Diagnosis not present

## 2017-06-03 DIAGNOSIS — M48061 Spinal stenosis, lumbar region without neurogenic claudication: Secondary | ICD-10-CM | POA: Diagnosis not present

## 2017-06-06 ENCOUNTER — Other Ambulatory Visit: Payer: Self-pay | Admitting: Family Medicine

## 2017-06-10 ENCOUNTER — Telehealth: Payer: Self-pay | Admitting: Family Medicine

## 2017-06-10 MED ORDER — LEVOTHYROXINE SODIUM 25 MCG PO TABS: 25.0000 ug | ORAL_TABLET | Freq: Every day | ORAL | 5 refills | Status: DC

## 2017-06-10 NOTE — Telephone Encounter (Signed)
Copied from [place]. Topic: Quick Communication - Rx Refill/Question >> Jun 10, 2017 11:30 AM Arletha Grippe wrote: Medication: zolpidem (AMBIEN) 10 MG tablet and levothyroxine (SYNTHROID, LEVOTHROID) 25 MCG tablet   Has the patient contacted their pharmacy? Yes.  Megan Booth has not heard back    (Agent: If no, request that the patient contact the pharmacy for the refill.)   Preferred Pharmacy (with phone number or street name): [facility]    Agent: Please be advised that RX refills may take up to 3 business days. We ask that you follow-up with your pharmacy.

## 2017-06-10 NOTE — Telephone Encounter (Signed)
Yes thanks. Does she want 5mg  instead of 10mg  and cut in half? We could do #30 if so instead of #15

## 2017-06-10 NOTE — Telephone Encounter (Signed)
Please advise 

## 2017-06-10 NOTE — Telephone Encounter (Signed)
Ambien, Levothyroxine refill Last OV: 04/29/17 Last Refill:Ambien 10/16/16 15 tab/0 refills; Levothyroxine 03/28/17 30 tab/1 refill Pharmacy:[facility]

## 2017-06-10 NOTE — Telephone Encounter (Signed)
Dr. Yong Channel, pt requesting refill on Zolpidem 10 mg. Sent refill for Levothyroxine.

## 2017-06-11 NOTE — Telephone Encounter (Signed)
Left message on voicemail to call office.  

## 2017-06-11 NOTE — Telephone Encounter (Signed)
Attempted to contact pt regarding prescription refill request; see note from Dr Yong Channel dated 06/10/17; left message on voice mail [phone]. Copied from [place]. Topic: Quick Communication - Rx Refill/Question >> Jun 10, 2017 11:30 AM Arletha Grippe wrote: Medication: zolpidem (AMBIEN) 10 MG tablet and levothyroxine (SYNTHROID, LEVOTHROID) 25 MCG tablet   Has the patient contacted their pharmacy? Yes.  Filbert Berthold has not heard back    (Agent: If no, request that the patient contact the pharmacy for the refill.)   Preferred Pharmacy (with phone number or street name): [facility]    Agent: Please be advised that RX refills may take up to 3 business days. We ask that you follow-up with your pharmacy. >> Jun 11, 2017  2:05 PM Marian Sorrow, LPN wrote: Please see message from Dr. Yong Channel and ask if wants Ambien 5 mg tablets or 10 mg tablets.

## 2017-06-12 MED ORDER — ZOLPIDEM TARTRATE 10 MG PO TABS: ORAL_TABLET | ORAL | 0 refills | Status: DC

## 2017-06-12 NOTE — Telephone Encounter (Signed)
Left detailed message on voicemail Rx's sent to pharmacy as requested.

## 2017-06-12 NOTE — Addendum Note (Signed)
Addended by: Marian Sorrow on: 06/12/2017 08:55 AM   Modules accepted: Orders

## 2017-06-13 ENCOUNTER — Other Ambulatory Visit: Payer: Self-pay | Admitting: Family Medicine

## 2017-06-26 DIAGNOSIS — Z85828 Personal history of other malignant neoplasm of skin: Secondary | ICD-10-CM | POA: Diagnosis not present

## 2017-06-26 DIAGNOSIS — L820 Inflamed seborrheic keratosis: Secondary | ICD-10-CM | POA: Diagnosis not present

## 2017-07-04 ENCOUNTER — Ambulatory Visit: Payer: Self-pay | Admitting: *Deleted

## 2017-07-04 NOTE — Telephone Encounter (Signed)
Contacted pt regarding her symptoms of "feeling sick on her stomach"; she has had vomiting and diarrhea since 0300 today; she states that she and her temperature is 98ish takes nexium in the morning and pepcid at night; nurse triage initiated and recommendations made per protocol; pt verbalizes understanding and will follow home care advise. Reason for Disposition . MILD-MODERATE diarrhea (e.g., 1-6 times / day more than normal)  Answer Assessment - Initial Assessment Questions 1. DIARRHEA SEVERITY: "How bad is the diarrhea?" "How many extra stools have you had in the past 24 hours than normal?"    - MILD: Few loose or mushy BMs; increase of 1-3 stools over normal daily number of stools; mild increase in ostomy output.   - MODERATE: Increase of 4-6 stools daily over normal; moderate increase in ostomy output.   - SEVERE (or Worst Possible): Increase of 7 or more stools daily over normal; moderate increase in ostomy output; incontinence.     moderate 2. ONSET: "When did the diarrhea begin?"      0300 on 07/04/17 3. BM CONSISTENCY: "How loose or watery is the diarrhea?"      5 loose stools 4. VOMITING: "Are you also vomiting?" If so, ask: "How many times in the past 24 hours?"      Yes 3 times today 5. ABDOMINAL PAIN: "Are you having any abdominal pain?" If yes: "What does it feel like?" (e.g., crampy, dull, intermittent, constant)      no 6. ABDOMINAL PAIN SEVERITY: If present, ask: "How bad is the pain?"  (e.g., Scale 1-10; mild, moderate, or severe)    - MILD (1-3): doesn't interfere with normal activities, abdomen soft and not tender to touch     - MODERATE (4-7): interferes with normal activities or awakens from sleep, tender to touch     - SEVERE (8-10): excruciating pain, doubled over, unable to do any normal activities       n/a 7. ORAL INTAKE: If vomiting, "Have you been able to drink liquids?" "How much fluids have you had in the past 24 hours?"     Has had 3 12 ounce coles 8.  HYDRATION: "Any signs of dehydration?" (e.g., dry mouth [not just dry lips], too weak to stand, dizziness, new weight loss) "When did you last urinate?"     No; last around 1000 9. EXPOSURE: "Have you traveled to a foreign country recently?" "Have you been exposed to anyone with diarrhea?" "Could you have eaten any food that was spoiled?"     Last meal was at a restuarant last night 10. OTHER SYMPTOMS: "Do you have any other symptoms?" (e.g., fever, blood in stool)       "Feels like something is stuck in my throat, burning in throat, feels like I have to get pheglem out of my throat" 11. PREGNANCY: "Is there any chance you are pregnant?" "When was your last menstrual period?"       no  Protocols used: DIARRHEA-A-AH

## 2017-07-04 NOTE — Telephone Encounter (Signed)
Attempted to contact pt regarding symptoms; unable to complete nurse triage; left message on voicemail [phone]

## 2017-07-08 ENCOUNTER — Ambulatory Visit: Payer: Self-pay | Admitting: *Deleted

## 2017-07-08 NOTE — Telephone Encounter (Signed)
Patient is scheduled with Megan Booth on 07/09/17

## 2017-07-08 NOTE — Telephone Encounter (Signed)
Called pt back regarding her symptoms of diarrhea since last Thursday. Her diarrhea is somewhat better and she only had vomiting that one day.  Every time she eats, the food is going straight thru. She has not had a regular meal. She is also taking imodium after each stool.  She feels weak now. Denies blood in stool or fever. Appointment made per protocol for this week.  Home care advice given to patient with verbal understanding. Will notify [place] office.  Reason for Disposition . [1] MILD diarrhea (e.g., 1-3 or more stools than normal in past 24 hours) without known cause AND [2] present >  7 days  Answer Assessment - Initial Assessment Questions 1. DIARRHEA SEVERITY: "How bad is the diarrhea?" "How many extra stools have you had in the past 24 hours than normal?"    - MILD: Few loose or mushy BMs; increase of 1-3 stools over normal daily number of stools; mild increase in ostomy output.   - MODERATE: Increase of 4-6 stools daily over normal; moderate increase in ostomy output.   - SEVERE (or Worst Possible): Increase of 7 or more stools daily over normal; moderate increase in ostomy output; incontinence.     mild 2. ONSET: "When did the diarrhea begin?"      This time on Sunday 3. BM CONSISTENCY: "How loose or watery is the diarrhea?"      Watery diarrhea 4. VOMITING: "Are you also vomiting?" If so, ask: "How many times in the past 24 hours?"      none 5. ABDOMINAL PAIN: "Are you having any abdominal pain?" If yes: "What does it feel like?" (e.g., crampy, dull, intermittent, constant)      no 6. ABDOMINAL PAIN SEVERITY: If present, ask: "How bad is the pain?"  (e.g., Scale 1-10; mild, moderate, or severe)    - MILD (1-3): doesn't interfere with normal activities, abdomen soft and not tender to touch     - MODERATE (4-7): interferes with normal activities or awakens from sleep, tender to touch     - SEVERE (8-10): excruciating pain, doubled over, unable to do any normal  activities       None 7. ORAL INTAKE: If vomiting, "Have you been able to drink liquids?" "How much fluids have you had in the past 24 hours?"     Yes, water, Gatorade, ginger ale and coke 8. HYDRATION: "Any signs of dehydration?" (e.g., dry mouth [not just dry lips], too weak to stand, dizziness, new weight loss) "When did you last urinate?"     Frequently urination, starts dark but gets light through out the day 9. EXPOSURE: "Have you traveled to a foreign country recently?" "Have you been exposed to anyone with diarrhea?" "Could you have eaten any food that was spoiled?"     Possibly by being with her mom in assistant living. Went to [employer] Wed evening and had a meal then the first diarrhea started that Thursday morning. 10. OTHER SYMPTOMS: "Do you have any other symptoms?" (e.g., fever, blood in stool)       none 11. PREGNANCY: "Is there any chance you are pregnant?" "When was your last menstrual period?"       no  Protocols used: DIARRHEA-A-AH

## 2017-07-08 NOTE — Telephone Encounter (Signed)
Noted  

## 2017-07-08 NOTE — Telephone Encounter (Signed)
See note

## 2017-07-09 ENCOUNTER — Ambulatory Visit (INDEPENDENT_AMBULATORY_CARE_PROVIDER_SITE_OTHER): Payer: Medicare Other

## 2017-07-09 ENCOUNTER — Encounter: Payer: Self-pay | Admitting: Physician Assistant

## 2017-07-09 ENCOUNTER — Other Ambulatory Visit: Payer: Self-pay | Admitting: Physician Assistant

## 2017-07-09 ENCOUNTER — Ambulatory Visit (INDEPENDENT_AMBULATORY_CARE_PROVIDER_SITE_OTHER): Payer: Medicare Other | Admitting: Physician Assistant

## 2017-07-09 VITALS — BP 110/80 | HR 80 | Temp 97.5°F | Ht 63.0 in | Wt 165.0 lb

## 2017-07-09 DIAGNOSIS — R197 Diarrhea, unspecified: Secondary | ICD-10-CM

## 2017-07-09 DIAGNOSIS — R1032 Left lower quadrant pain: Secondary | ICD-10-CM

## 2017-07-09 DIAGNOSIS — N939 Abnormal uterine and vaginal bleeding, unspecified: Secondary | ICD-10-CM | POA: Diagnosis not present

## 2017-07-09 LAB — CBC WITH DIFFERENTIAL/PLATELET
BASOS PCT: 0.4 % (ref 0.0–3.0)
Basophils Absolute: 0 10*3/uL (ref 0.0–0.1)
EOS ABS: 0.1 10*3/uL (ref 0.0–0.7)
Eosinophils Relative: 0.7 % (ref 0.0–5.0)
HEMATOCRIT: 41 % (ref 36.0–46.0)
HEMOGLOBIN: 13.9 g/dL (ref 12.0–15.0)
LYMPHS PCT: 24.4 % (ref 12.0–46.0)
Lymphs Abs: 2.6 10*3/uL (ref 0.7–4.0)
MCHC: 33.9 g/dL (ref 30.0–36.0)
MCV: 93.5 fl (ref 78.0–100.0)
MONO ABS: 0.8 10*3/uL (ref 0.1–1.0)
Monocytes Relative: 7.7 % (ref 3.0–12.0)
Neutro Abs: 7.1 10*3/uL (ref 1.4–7.7)
Neutrophils Relative %: 66.8 % (ref 43.0–77.0)
Platelets: 295 10*3/uL (ref 150.0–400.0)
RBC: 4.38 Mil/uL (ref 3.87–5.11)
RDW: 13.3 % (ref 11.5–15.5)
WBC: 10.6 10*3/uL — AB (ref 4.0–10.5)

## 2017-07-09 LAB — COMPREHENSIVE METABOLIC PANEL
ALBUMIN: 4.1 g/dL (ref 3.5–5.2)
ALK PHOS: 67 U/L (ref 39–117)
ALT: 16 U/L (ref 0–35)
AST: 10 U/L (ref 0–37)
BUN: 12 mg/dL (ref 6–23)
CALCIUM: 9.2 mg/dL (ref 8.4–10.5)
CO2: 23 meq/L (ref 19–32)
Chloride: 106 mEq/L (ref 96–112)
Creatinine, Ser: 1.01 mg/dL (ref 0.40–1.20)
GFR: 57.64 mL/min — ABNORMAL LOW (ref >60.00)
Glucose, Bld: 103 mg/dL — ABNORMAL HIGH (ref 70–99)
POTASSIUM: 4.2 meq/L (ref 3.5–5.1)
SODIUM: 137 meq/L (ref 135–145)
Total Bilirubin: 0.7 mg/dL (ref 0.2–1.2)
Total Protein: 6.5 g/dL (ref 6.0–8.3)

## 2017-07-09 LAB — POCT URINALYSIS DIPSTICK
GLUCOSE UA: NEGATIVE
LEUKOCYTES UA: NEGATIVE
Nitrite, UA: NEGATIVE
PH UA: 6 (ref 5.0–8.0)
SPEC GRAV UA: 1.02 (ref 1.010–1.025)
UROBILINOGEN UA: 0.2 U/dL

## 2017-07-09 MED ORDER — METRONIDAZOLE 500 MG PO TABS: 500.0000 mg | ORAL_TABLET | Freq: Three times a day (TID) | ORAL | 0 refills | Status: AC

## 2017-07-09 MED ORDER — CIPROFLOXACIN HCL 500 MG PO TABS: 500.0000 mg | ORAL_TABLET | Freq: Two times a day (BID) | ORAL | 0 refills | Status: AC

## 2017-07-09 MED ORDER — ONDANSETRON HCL 4 MG PO TABS: 4.0000 mg | ORAL_TABLET | Freq: Three times a day (TID) | ORAL | 0 refills | Status: DC | PRN

## 2017-07-09 NOTE — Progress Notes (Signed)
Megan Booth is a 72 y.o. female here for a new problem.  I acted as a Education administrator for [employer], PA-C Anselmo Pickler, LPN  History of Present Illness:   Chief Complaint  Patient presents with  . Diarrhea    6 days, runny,   . Emesis    Stopped after Thursday    Diarrhea   This is a new problem. Episode onset: Started on Thursday 3/14. The problem occurs 5 to 10 times per day. The problem has been gradually improving. The stool consistency is described as watery [place] in color). The patient states that diarrhea does not awaken her from sleep. Associated symptoms include bloating, headaches and increased flatus. Pertinent negatives include no abdominal pain. Associated symptoms comments: Vomiting on Thursday only, still have diarrhea and feels cold all the time. Pt started with vaginal bleeding today says due to missing some of her hormone pills.. Nothing aggravates the symptoms. Risk factors: has visited her mother in assisted living. She has tried change of diet (Immodium) for the symptoms. The treatment provided mild relief. There is no history of bowel resection, irritable bowel syndrome or a recent abdominal surgery.   Currently between [facility] Stool Chart Type 6 and 7 stooling pattern. Wednesday night had a flatbread pizza with meat, got at a restaurant -- next morning at 3 am had projectile vomiting x 4. Had "explosive diarrhea" x 15 hours. Now still having diarrhea. Was able to eat crackers, gatorade, gingerale, peanut butter, cheese sandwich. Several hours after having bites to eat, will have diarrhea. Taking Imodium, two tablets daily. Prior to this was having hard stools with constipation, approximately daily -- [place] Stool Chart Type 1.  Having LLQ abdominal pain. No recent travel. Colonoscopy from May 2011 showed "moderate diverticulosis" in the sigmoid colon. She denies hx of diverticulitis.  Now having vaginal bleeding. Last period was around age 86. Has been on this  hormone medication per Dr. Katharine Look Rivard x several years -- on Estradiol-Estriol-Progesterone, started around age 59, takes for hot flashes. This morning she has had some vaginal bleeding, has missed taking her hormone pills routinely due to being ill over the past week. Vaginal bleeding is enough for 1 tampon. No recent sexual intercourse. Does not have any vaginal discharge. No personal or family history of gynecological cancers.  Wt Readings from Last 5 Encounters:  07/09/17 165 lb (74.8 kg)  04/29/17 169 lb (76.7 kg)  04/21/17 170 lb (77.1 kg)  03/28/17 171 lb 6.4 oz (77.7 kg)  09/14/16 165 lb 1 oz (74.9 kg)    Past Medical History:  Diagnosis Date  . Amnesia, global, transient 2015   see hospital notes  . Anxiety   . Barrett esophagus    Dr. Lajoyce Corners  . Diverticulosis   . GERD (gastroesophageal reflux disease)   . Hx of colonic polyps    Dr. Lajoyce Corners   . Hyperlipidemia   . Hypothyroidism   . Internal hemorrhoid   . Leukoplakia, vulva   . Sleep apnea      Social History   Socioeconomic History  . Marital status: Married    Spouse name: Not on file  . Number of children: 0  . Years of education: Not on file  . Highest education level: Not on file  Social Needs  . Financial resource strain: Not on file  . Food insecurity - worry: Not on file  . Food insecurity - inability: Not on file  . Transportation needs - medical: Not on file  .  Transportation needs - non-medical: Not on file  Occupational History  . Occupation: Training and development officer: [employer]  Tobacco Use  . Smoking status: Never Smoker  . Smokeless tobacco: Never Used  Substance and Sexual Activity  . Alcohol use: Yes    Alcohol/week: 0.6 oz    Types: 1 Glasses of wine per week  . Drug use: No  . Sexual activity: No    Birth control/protection: Surgical    Comment: BTL  Other Topics Concern  . Not on file  Social History Narrative   Married (Husband with Dr. Linna Darner), no children.        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 BREAST ENHANCEMENT SURGERY    . DILATION AND CURETTAGE OF UTERUS     x 2  . HYSTEROSCOPY    . LASIK     eye  . TUBAL LIGATION      Family History  Problem Relation Age of Onset  . Heart disease Mother        pacemaker  . Hypertension Mother   . Depression Mother   . Diabetes Mother   . Dementia Mother   . Cancer Father 14       lung, smoker  . Seizures Brother   . Colon cancer Neg Hx   . Esophageal cancer Neg Hx   . Stomach cancer Neg Hx     No Known Allergies  Current Medications:   Current Outpatient Medications:  .  traMADol (ULTRAM) 50 MG tablet, Take 1 mg by mouth every 6 (six) hours as needed., Disp: , Rfl:  .  atorvastatin (LIPITOR) 40 MG tablet, TAKE 1 TABLET ONCE DAILY AT 6 PM., Disp: 90 tablet, Rfl: 2 .  Biotin 5 MG/ML LIQD, Take 5,000 mcg by mouth daily as needed. , Disp: , Rfl:  .  Cholecalciferol (VITAMIN D3) 1000 units CAPS, Take 1 capsule by mouth daily., Disp: , Rfl:  .  ciprofloxacin (CIPRO) 500 MG tablet, Take 1 tablet (500 mg total) by mouth 2 (two) times daily for 7 days., Disp: 14 tablet, Rfl: 0 .  citalopram (CELEXA) 10 MG tablet, TAKE 1 TABLET ONCE DAILY., Disp: 90 tablet, Rfl: 1 .  esomeprazole (NEXIUM) 40 MG capsule, TAKE 1 CAPSULE ONCE DAILY BEFORE BREAKFAST., Disp: 90 capsule, Rfl: 0 .  Estradiol-Estriol-Progesterone (BIEST/PROGESTERONE TD), Take 1 tablet by mouth 2 (two) times daily., Disp: , Rfl:  .  famotidine (PEPCID) 20 MG tablet, Take 20 mg by mouth daily. , Disp: , Rfl:  .  furosemide (LASIX) 20 MG tablet, Take 1 tablet (20 mg total) by mouth daily as needed for fluid or edema., Disp: 90 tablet, Rfl: 1 .  levothyroxine (SYNTHROID, LEVOTHROID) 25 MCG tablet, Take 1 tablet (25 mcg total) by mouth daily., Disp: 30 tablet, Rfl: 5 .  meloxicam (MOBIC) 15 MG tablet, meloxicam 15 mg tablet  Take 1  tablet every day by oral route for 30 days., Disp: , Rfl:  .  metroNIDAZOLE (FLAGYL) 500 MG tablet, Take 1 tablet (500 mg total) by mouth 3 (three) times daily for 7 days., Disp: 21 tablet, Rfl: 0 .  naproxen sodium (ALEVE) 220 MG tablet, Take 220 mg by mouth daily as needed. , Disp: , Rfl:  .  zolpidem (AMBIEN) 10 MG tablet, TAKE 1/2 TABLET AT BEDTIME AS NEEDED FOR SLEEP., Disp: 15  tablet, Rfl: 0   Review of Systems:   Review of Systems  Gastrointestinal: Positive for bloating, diarrhea and flatus. Negative for abdominal pain.  Neurological: Positive for headaches.    Vitals:   Vitals:   07/09/17 0823  BP: 110/80  Pulse: 80  Temp: (!) 97.5 F (36.4 C)  TempSrc: Oral  SpO2: 99%  Weight: 165 lb (74.8 kg)  Height: 5\' 3"  (1.6 m)     Body mass index is 29.23 kg/m.  Physical Exam:   Physical Exam  Constitutional: She appears well-developed. She is cooperative.  Non-toxic appearance. She does not have a sickly appearance. She does not appear ill. No distress.  Cardiovascular: Normal rate, regular rhythm, S1 normal, S2 normal, normal heart sounds and normal pulses.  No LE edema  Pulmonary/Chest: Effort normal and breath sounds normal.  Abdominal: Normal appearance. Bowel sounds are increased. There is tenderness in the suprapubic area and left lower quadrant. There is no rigidity, no rebound, no guarding, no CVA tenderness, no tenderness at McBurney's point and negative Murphy's sign.  Genitourinary: Rectum normal. There is no rash, tenderness or lesion on the right labia. There is no rash, tenderness or lesion on the left labia. Uterus is tender. There is bleeding (scant) in the vagina. No erythema or tenderness in the vagina.  Neurological: She is alert. GCS eye subscore is 4. GCS verbal subscore is 5. GCS motor subscore is 6.  Skin: Skin is warm, dry and intact.  Psychiatric: She has a normal mood and affect. Her speech is normal and behavior is normal.  Nursing note and vitals  reviewed.  Abdominal KUB:   Results for orders placed or performed in visit on 07/09/17  POCT urinalysis dipstick  Result Value Ref Range   Color, UA Yellow    Clarity, UA Clear    Glucose, UA Negative    Bilirubin, UA Small    Ketones, UA Moderate    Spec Grav, UA 1.020 1.010 - 1.025   Blood, UA Trace    pH, UA 6.0 5.0 - 8.0   Protein, UA Trace    Urobilinogen, UA 0.2 0.2 or 1.0 E.U./dL   Nitrite, UA Negative    Leukocytes, UA Negative Negative   Appearance     Odor     CLINICAL DATA:  Intermittent left lower quadrant pain and diarrhea for 5 days.  EXAM: ABDOMEN - 1 VIEW  COMPARISON:  None.  FINDINGS: The bowel gas pattern is normal. No radio-opaque calculi or other significant radiographic abnormality are seen.  IMPRESSION: Negative exam.   Electronically Signed   By: Inge Rise M.D.   On: 07/09/2017 09:49   Assessment and Plan:    Laina was seen today for diarrhea and emesis.  Diagnoses and all orders for this visit:  Diarrhea, unspecified type, LLQ pain, Vaginal Bleeding Given hx and physical exam findings, will empirically treat with Cipro and Flagyl at this time for possible diverticulitis. Hx does sound suspicious for constipation prior to onset of symptoms, so I recommended that she hold off on further use of Imodium at this time. Abd xray without acute findings. We discussed that if her pain worsens, a CT scan may be warranted. I recommended that she see her Ob-Gyn soon to discuss her vaginal bleeding and pelvic tenderness, I would like her to reach out to them this week. Patient verbalized understanding. -     DG Abd 1 View; Future -     CBC with Differential/Platelet -  Comprehensive metabolic panel -     POCT urinalysis dipstick -     Urine Culture  Other orders -     ciprofloxacin (CIPRO) 500 MG tablet; Take 1 tablet (500 mg total) by mouth 2 (two) times daily for 7 days. -     metroNIDAZOLE (FLAGYL) 500 MG tablet; Take 1 tablet  (500 mg total) by mouth 3 (three) times daily for 7 days.    . Reviewed expectations re: course of current medical issues. . Discussed self-management of symptoms. . Outlined signs and symptoms indicating need for more acute intervention. . Patient verbalized understanding and all questions were answered. . See orders for this visit as documented in the electronic medical record. . Patient received an After-Visit Summary.  CMA or LPN served as scribe during this visit. History, Physical, and Plan performed by medical provider. Documentation and orders reviewed and attested to.  Inda Coke, PA-C

## 2017-07-09 NOTE — Patient Instructions (Signed)
It was great to meet you!  We will call you with your lab results.  Start the oral antibiotic, take as prescribed. DO NOT DRINK ALCOHOL WITH THIS MEDICATION.  Make an appointment with your ob-gyn ASAP to discuss your pelvic tenderness and vaginal bleeding.  If you develop worsening abdominal pain, fever, blood in stools or other other concerning symptoms --> PLEASE SEEK MEDICAL ATTENTION.

## 2017-07-10 LAB — URINE CULTURE
MICRO NUMBER: 90345200
SPECIMEN QUALITY: ADEQUATE

## 2017-07-12 DIAGNOSIS — R109 Unspecified abdominal pain: Secondary | ICD-10-CM | POA: Diagnosis not present

## 2017-07-12 DIAGNOSIS — R197 Diarrhea, unspecified: Secondary | ICD-10-CM | POA: Diagnosis not present

## 2017-07-12 DIAGNOSIS — N950 Postmenopausal bleeding: Secondary | ICD-10-CM | POA: Diagnosis not present

## 2017-07-26 ENCOUNTER — Other Ambulatory Visit: Payer: Self-pay | Admitting: Family Medicine

## 2017-08-30 DIAGNOSIS — E669 Obesity, unspecified: Secondary | ICD-10-CM | POA: Diagnosis not present

## 2017-09-14 ENCOUNTER — Other Ambulatory Visit: Payer: Self-pay | Admitting: Family Medicine

## 2017-09-17 ENCOUNTER — Ambulatory Visit: Payer: Medicare Other | Admitting: Family Medicine

## 2017-09-17 ENCOUNTER — Ambulatory Visit: Payer: Medicare Other | Admitting: *Deleted

## 2017-09-17 ENCOUNTER — Ambulatory Visit: Payer: Medicare Other

## 2017-09-17 ENCOUNTER — Other Ambulatory Visit: Payer: Self-pay | Admitting: Family Medicine

## 2017-09-19 ENCOUNTER — Ambulatory Visit: Payer: Medicare Other | Admitting: *Deleted

## 2017-09-30 ENCOUNTER — Encounter: Payer: Self-pay | Admitting: Family Medicine

## 2017-10-01 DIAGNOSIS — Z01419 Encounter for gynecological examination (general) (routine) without abnormal findings: Secondary | ICD-10-CM | POA: Diagnosis not present

## 2017-10-01 DIAGNOSIS — N951 Menopausal and female climacteric states: Secondary | ICD-10-CM | POA: Diagnosis not present

## 2017-10-01 DIAGNOSIS — Z6829 Body mass index (BMI) 29.0-29.9, adult: Secondary | ICD-10-CM | POA: Diagnosis not present

## 2017-10-01 DIAGNOSIS — E079 Disorder of thyroid, unspecified: Secondary | ICD-10-CM | POA: Diagnosis not present

## 2017-10-01 DIAGNOSIS — Z124 Encounter for screening for malignant neoplasm of cervix: Secondary | ICD-10-CM | POA: Diagnosis not present

## 2017-10-15 ENCOUNTER — Telehealth: Payer: Self-pay

## 2017-10-15 NOTE — Telephone Encounter (Signed)
Called and submitted a Prior Authorization for zolpidem (AMBIEN) 10 MG tablet. Approval received. Approval valid through October 14, 2017. 45 tablets per 90 days. Call placed to [facility] to let them know. Spoke with Opal Sidles

## 2017-10-22 DIAGNOSIS — Z23 Encounter for immunization: Secondary | ICD-10-CM | POA: Diagnosis not present

## 2017-10-23 DIAGNOSIS — Z0000 Encounter for general adult medical examination without abnormal findings: Secondary | ICD-10-CM | POA: Diagnosis not present

## 2017-10-25 ENCOUNTER — Other Ambulatory Visit: Payer: Self-pay | Admitting: Family Medicine

## 2017-10-29 DIAGNOSIS — D225 Melanocytic nevi of trunk: Secondary | ICD-10-CM | POA: Diagnosis not present

## 2017-10-29 DIAGNOSIS — L820 Inflamed seborrheic keratosis: Secondary | ICD-10-CM | POA: Diagnosis not present

## 2017-10-29 DIAGNOSIS — L814 Other melanin hyperpigmentation: Secondary | ICD-10-CM | POA: Diagnosis not present

## 2017-10-29 DIAGNOSIS — L821 Other seborrheic keratosis: Secondary | ICD-10-CM | POA: Diagnosis not present

## 2017-10-29 DIAGNOSIS — D2272 Melanocytic nevi of left lower limb, including hip: Secondary | ICD-10-CM | POA: Diagnosis not present

## 2017-10-29 DIAGNOSIS — Z85828 Personal history of other malignant neoplasm of skin: Secondary | ICD-10-CM | POA: Diagnosis not present

## 2017-10-29 DIAGNOSIS — D2271 Melanocytic nevi of right lower limb, including hip: Secondary | ICD-10-CM | POA: Diagnosis not present

## 2017-10-30 DIAGNOSIS — Z1231 Encounter for screening mammogram for malignant neoplasm of breast: Secondary | ICD-10-CM | POA: Diagnosis not present

## 2017-10-30 LAB — HM MAMMOGRAPHY

## 2017-11-04 ENCOUNTER — Encounter: Payer: Self-pay | Admitting: Family Medicine

## 2017-11-05 ENCOUNTER — Encounter: Payer: Self-pay | Admitting: Family Medicine

## 2017-11-05 ENCOUNTER — Encounter: Payer: Self-pay | Admitting: *Deleted

## 2017-11-08 DIAGNOSIS — N959 Unspecified menopausal and perimenopausal disorder: Secondary | ICD-10-CM | POA: Diagnosis not present

## 2017-11-08 DIAGNOSIS — N951 Menopausal and female climacteric states: Secondary | ICD-10-CM | POA: Diagnosis not present

## 2017-11-20 ENCOUNTER — Other Ambulatory Visit: Payer: Self-pay

## 2017-11-20 ENCOUNTER — Encounter: Payer: Self-pay | Admitting: Family Medicine

## 2017-12-04 ENCOUNTER — Encounter: Payer: Self-pay | Admitting: Physician Assistant

## 2017-12-04 ENCOUNTER — Ambulatory Visit (INDEPENDENT_AMBULATORY_CARE_PROVIDER_SITE_OTHER): Payer: Medicare Other | Admitting: Physician Assistant

## 2017-12-04 VITALS — BP 132/80 | HR 70 | Temp 97.9°F | Ht 63.0 in | Wt 169.2 lb

## 2017-12-04 DIAGNOSIS — R61 Generalized hyperhidrosis: Secondary | ICD-10-CM

## 2017-12-04 DIAGNOSIS — R7309 Other abnormal glucose: Secondary | ICD-10-CM

## 2017-12-04 DIAGNOSIS — F419 Anxiety disorder, unspecified: Secondary | ICD-10-CM

## 2017-12-04 DIAGNOSIS — R202 Paresthesia of skin: Secondary | ICD-10-CM | POA: Diagnosis not present

## 2017-12-04 LAB — COMPREHENSIVE METABOLIC PANEL
ALBUMIN: 4.1 g/dL (ref 3.5–5.2)
ALT: 15 U/L (ref 0–35)
AST: 15 U/L (ref 0–37)
Alkaline Phosphatase: 60 U/L (ref 39–117)
BUN: 19 mg/dL (ref 6–23)
CALCIUM: 10.6 mg/dL — AB (ref 8.4–10.5)
CHLORIDE: 104 meq/L (ref 96–112)
CO2: 28 mEq/L (ref 19–32)
Creatinine, Ser: 1.23 mg/dL — ABNORMAL HIGH (ref 0.40–1.20)
GFR: 45.86 mL/min — ABNORMAL LOW (ref >60.00)
Glucose, Bld: 95 mg/dL (ref 70–99)
Potassium: 4 mEq/L (ref 3.5–5.1)
Sodium: 139 mEq/L (ref 135–145)
Total Bilirubin: 0.5 mg/dL (ref 0.2–1.2)
Total Protein: 6.9 g/dL (ref 6.0–8.3)

## 2017-12-04 LAB — CBC WITH DIFFERENTIAL/PLATELET
BASOS PCT: 0.7 % (ref 0.0–3.0)
Basophils Absolute: 0 10*3/uL (ref 0.0–0.1)
EOS PCT: 2.3 % (ref 0.0–5.0)
Eosinophils Absolute: 0.2 10*3/uL (ref 0.0–0.7)
HEMATOCRIT: 40.8 % (ref 36.0–46.0)
HEMOGLOBIN: 13.9 g/dL (ref 12.0–15.0)
LYMPHS PCT: 29.2 % (ref 12.0–46.0)
Lymphs Abs: 2 10*3/uL (ref 0.7–4.0)
MCHC: 34 g/dL (ref 30.0–36.0)
MCV: 93 fl (ref 78.0–100.0)
MONOS PCT: 7.4 % (ref 3.0–12.0)
Monocytes Absolute: 0.5 10*3/uL (ref 0.1–1.0)
Neutro Abs: 4.1 10*3/uL (ref 1.4–7.7)
Neutrophils Relative %: 60.4 % (ref 43.0–77.0)
Platelets: 267 10*3/uL (ref 150.0–400.0)
RBC: 4.39 Mil/uL (ref 3.87–5.11)
RDW: 12.9 % (ref 11.5–15.5)
WBC: 6.8 10*3/uL (ref 4.0–10.5)

## 2017-12-04 LAB — TSH: TSH: 1.83 u[IU]/mL (ref 0.35–4.50)

## 2017-12-04 LAB — HEMOGLOBIN A1C: HEMOGLOBIN A1C: 5.5 % (ref 4.6–6.5)

## 2017-12-04 LAB — VITAMIN B12: VITAMIN B 12: 284 pg/mL (ref 211–911)

## 2017-12-04 MED ORDER — FUROSEMIDE 20 MG PO TABS: 20.0000 mg | ORAL_TABLET | Freq: Every day | ORAL | 1 refills | Status: DC | PRN

## 2017-12-04 NOTE — Patient Instructions (Signed)
It was great to see you!  Please work on hydrating and eating a small snack prior to exercise.  Let's follow-up in 2 weeks, sooner if you have concerns.  Take care,  Inda Coke PA-C

## 2017-12-04 NOTE — Progress Notes (Signed)
Megan Booth is a 72 y.o. female here for a new problem.  History of Present Illness:   Chief Complaint  Patient presents with  . Flutters    side of neck for about 2 months    HPI   Skin Crawling Sensation Reports that she has had the sensation of her skin crawling on the L side of her neck for 1-2 months. Occurs 3-4 times a day but only lasts for a few seconds. No pain, chest pain, blurred vision, pre-syncope. Doesn't drink any caffeine.  If she applies pressure to it, it goes away. It is not painful. July 3rd had a Advice worker results -- carotid ultrasound was WNL, EKG was WNL, ABI was WNL, abdominal aortic ultrasound was WNL. The only thing that was abnormal was an elevated blood sugar. She does not recall what the number was nor does she have the results for me to review. She thinks she was fasting at that time. She does endorse recent anxiety --> a couple of months ago, her mom had required pacemaker change. Lots of friends having heart issues has her worried.  She also tells after she does a vigorous workout, she gets very sweaty, more than usual, and sometimes feels like she is going to pass out.  She does not have chest pain, worsening shortness of breath, or dizziness.  She wonders if she is having a panic attack at this time.  She tells me that she consistently does not eat or drink anything prior to her workouts which are around 11 AM, wakes up around 8 AM.  Past Medical History:  Diagnosis Date  . Amnesia, global, transient 2015   see hospital notes  . Anxiety   . Barrett esophagus    Dr. Lajoyce Corners  . Diverticulosis   . GERD (gastroesophageal reflux disease)   . Hx of colonic polyps    Dr. Lajoyce Corners   . Hyperlipidemia   . Hypothyroidism   . Internal hemorrhoid   . Leukoplakia, vulva   . Sleep apnea      Social History   Socioeconomic History  . Marital status: Married    Spouse name: Not on file  . Number of children: 0  . Years of education: Not on file  .  Highest education level: Not on file  Occupational History  . Occupation: Training and development officer: [employer]  Social Needs  . Financial resource strain: Not on file  . Food insecurity:    Worry: Not on file    Inability: Not on file  . Transportation needs:    Medical: Not on file    Non-medical: Not on file  Tobacco Use  . Smoking status: Never Smoker  . Smokeless tobacco: Never Used  Substance and Sexual Activity  . Alcohol use: Yes    Alcohol/week: 1.0 standard drinks    Types: 1 Glasses of wine per week  . Drug use: No  . Sexual activity: Never    Birth control/protection: Surgical    Comment: BTL  Lifestyle  . Physical activity:    Days per week: Not on file    Minutes per session: Not on file  . Stress: Not on file  Relationships  . Social connections:    Talks on phone: Not on file    Gets together: Not on file    Attends religious service: Not on file    Active member of club or organization: Not on file    Attends  meetings of clubs or organizations: Not on file    Relationship status: Not on file  . Intimate partner violence:    Fear of current or ex partner: Not on file    Emotionally abused: Not on file    Physically abused: Not on file    Forced sexual activity: Not on file  Other Topics Concern  . Not on file  Social History Narrative   Married (Husband with Dr. Linna Darner), no children.       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 BREAST ENHANCEMENT SURGERY    . DILATION AND CURETTAGE OF UTERUS     x 2  . HYSTEROSCOPY    . LASIK     eye  . TUBAL LIGATION      Family History  Problem Relation Age of Onset  . Heart disease Mother        pacemaker  . Hypertension Mother   . Depression Mother   . Diabetes Mother   . Dementia Mother   . Cancer Father 16       lung, smoker  . Seizures Brother   . Colon cancer Neg Hx    . Esophageal cancer Neg Hx   . Stomach cancer Neg Hx     No Known Allergies  Current Medications:   Current Outpatient Medications:  .  atorvastatin (LIPITOR) 40 MG tablet, TAKE 1 TABLET ONCE DAILY AT 6 PM., Disp: 90 tablet, Rfl: 2 .  Biotin 5 MG/ML LIQD, Take 5,000 mcg by mouth daily as needed. , Disp: , Rfl:  .  Cholecalciferol (VITAMIN D3) 1000 units CAPS, Take 1 capsule by mouth daily., Disp: , Rfl:  .  citalopram (CELEXA) 10 MG tablet, TAKE 1 TABLET ONCE DAILY., Disp: 90 tablet, Rfl: 1 .  esomeprazole (NEXIUM) 40 MG capsule, TAKE 1 CAPSULE ONCE DAILY BEFORE BREAKFAST., Disp: 90 capsule, Rfl: 1 .  Estradiol-Estriol-Progesterone (BIEST/PROGESTERONE TD), Take 1 tablet by mouth 2 (two) times daily., Disp: , Rfl:  .  famotidine (PEPCID) 20 MG tablet, Take 20 mg by mouth daily. , Disp: , Rfl:  .  furosemide (LASIX) 20 MG tablet, Take 1 tablet (20 mg total) by mouth daily as needed for fluid or edema., Disp: 90 tablet, Rfl: 1 .  levothyroxine (SYNTHROID, LEVOTHROID) 25 MCG tablet, Take 1 tablet (25 mcg total) by mouth daily., Disp: 30 tablet, Rfl: 5 .  meloxicam (MOBIC) 15 MG tablet, meloxicam 15 mg tablet  Take 1 tablet every day by oral route for 30 days., Disp: , Rfl:  .  naproxen sodium (ALEVE) 220 MG tablet, Take 220 mg by mouth daily as needed. , Disp: , Rfl:  .  zolpidem (AMBIEN) 10 MG tablet, TAKE 1/2 TABLET AT BEDTIME AS NEEDED FOR SLEEP., Disp: 15 tablet, Rfl: 2   Review of Systems:   ROS  Negative unless otherwise specified per HPI.  Vitals:   Vitals:   12/04/17 1100  BP: 132/80  Pulse: 70  Temp: 97.9 F (36.6 C)  TempSrc: Oral  SpO2: 98%  Weight: 169 lb 3.2 oz (76.7 kg)  Height: 5\' 3"  (1.6 m)     Body mass index is 29.97 kg/m.  Physical Exam:   Physical Exam  Constitutional: She appears well-developed. She is cooperative.  Non-toxic appearance. She does not have a sickly appearance. She does not appear ill. No distress.  Cardiovascular: Normal  rate, regular  rhythm, S1 normal, S2 normal, normal heart sounds and normal pulses.  No LE edema  Pulmonary/Chest: Effort normal and breath sounds normal.  Neurological: She is alert. She has normal strength. No cranial nerve deficit or sensory deficit. Coordination and gait normal. GCS eye subscore is 4. GCS verbal subscore is 5. GCS motor subscore is 6.  Skin: Skin is warm, dry and intact.  No visible abnormalities appreciated on L side of neck  Psychiatric: She has a normal mood and affect. Her speech is normal and behavior is normal.  Cheerful and pleasant  Nursing note and vitals reviewed.   Assessment and Plan:    Tillie was seen today for flutters.  Diagnoses and all orders for this visit:  Sensation of skin crawling No red flags on exam. Provided reassurance. Will check labs for further evaluation and treatment. Further intervention based on lab results. Follow-up in 2 weeks. -     TSH -     CBC with Differential/Platelet -     Comprehensive metabolic panel -     Vitamin B12  Elevated glucose We will recheck a hemoglobin A1c today to further evaluate. -     Hemoglobin A1c  Sweating increase and Anxiety No red flags on exam. Recent [facility] Screening results normal and reassuring. I do think that maybe she is having some increased anxiety and possible hypoglycemic events after working out.  We discussed the importance of eating and drinking prior to workout, I recommended that she have a good source of protein and carbohydrate prior to workouts.  Follow-up in 2 weeks to follow-up on this, sooner if symptoms worsen or other concerns.  Patient is agreeable to plan.  If symptoms persist, we may need to pursue cardiac evaluation or outpatient tests. -     TSH -     CBC with Differential/Platelet -     Comprehensive metabolic panel  Other orders -     furosemide (LASIX) 20 MG tablet; Take 1 tablet (20 mg total) by mouth daily as needed for fluid or edema.    . Reviewed expectations re:  course of current medical issues. . Discussed self-management of symptoms. . Outlined signs and symptoms indicating need for more acute intervention. . Patient verbalized understanding and all questions were answered. . See orders for this visit as documented in the electronic medical record. . Patient received an After-Visit Summary.  Inda Coke, PA-C

## 2017-12-18 ENCOUNTER — Encounter: Payer: Self-pay | Admitting: Physician Assistant

## 2017-12-18 ENCOUNTER — Ambulatory Visit (INDEPENDENT_AMBULATORY_CARE_PROVIDER_SITE_OTHER): Payer: Medicare Other | Admitting: Physician Assistant

## 2017-12-18 VITALS — BP 110/76 | HR 76 | Temp 97.7°F | Ht 63.0 in | Wt 172.2 lb

## 2017-12-18 DIAGNOSIS — E538 Deficiency of other specified B group vitamins: Secondary | ICD-10-CM | POA: Diagnosis not present

## 2017-12-18 DIAGNOSIS — R202 Paresthesia of skin: Secondary | ICD-10-CM

## 2017-12-18 DIAGNOSIS — N289 Disorder of kidney and ureter, unspecified: Secondary | ICD-10-CM

## 2017-12-18 LAB — COMPREHENSIVE METABOLIC PANEL
ALT: 16 U/L (ref 0–35)
AST: 14 U/L (ref 0–37)
Albumin: 4 g/dL (ref 3.5–5.2)
Alkaline Phosphatase: 69 U/L (ref 39–117)
BUN: 23 mg/dL (ref 6–23)
CALCIUM: 9.2 mg/dL (ref 8.4–10.5)
CHLORIDE: 106 meq/L (ref 96–112)
CO2: 22 mEq/L (ref 19–32)
Creatinine, Ser: 1.16 mg/dL (ref 0.40–1.20)
GFR: 49.07 mL/min — AB (ref >60.00)
GLUCOSE: 88 mg/dL (ref 70–99)
POTASSIUM: 4.4 meq/L (ref 3.5–5.1)
Sodium: 139 mEq/L (ref 135–145)
Total Bilirubin: 0.4 mg/dL (ref 0.2–1.2)
Total Protein: 6.4 g/dL (ref 6.0–8.3)

## 2017-12-18 MED ORDER — CYANOCOBALAMIN 1000 MCG/ML IJ SOLN
1000.0000 ug | Freq: Once | INTRAMUSCULAR | Status: AC
  Administered 2017-12-18: 1000 ug via INTRAMUSCULAR

## 2017-12-18 NOTE — Progress Notes (Signed)
Megan Booth is a 72 y.o. female is here to discuss:  History of Present Illness:   Chief Complaint  Patient presents with  . Lab Results    HPI   Patient presents for follow-up on "flutters" on the side of her neck. At her last visit, labs were checked which revealed vitamin B12 of 284. She has had B12 injections in the past, as recently as 2015 and tolerated well. She is agreeable to repeating vitamin B12 injections. Labs also revealed slightly decreased renal function. We discussed need for adequate hydration, especially prior to work-outs. She states that she has been drinking a protein drink and water prior to her work-outs now and this has helped her stamina during her work-outs. She has been taking care of her mom more often than usual and states that she suspects that this has lead to more emotional and comfort eating on her part at the end of the day and attributes this to some mild weight gain. Denies: CP, SOB, changes in vision, unusual headaches, dizziness, lightheadedness.  Lab Results  Component Value Date   CREATININE 1.23 (H) 12/04/2017   CREATININE 1.01 07/09/2017   CREATININE 1.20 04/29/2017    Wt Readings from Last 3 Encounters:  12/18/17 172 lb 3.2 oz (78.1 kg)  12/04/17 169 lb 3.2 oz (76.7 kg)  07/09/17 165 lb (74.8 kg)      There are no preventive care reminders to display for this patient.  Past Medical History:  Diagnosis Date  . Amnesia, global, transient 2015   see hospital notes  . Anxiety   . Barrett esophagus    Dr. Lajoyce Corners  . Diverticulosis   . GERD (gastroesophageal reflux disease)   . Hx of colonic polyps    Dr. Lajoyce Corners   . Hyperlipidemia   . Hypothyroidism   . Internal hemorrhoid   . Leukoplakia, vulva   . Sleep apnea      Social History   Socioeconomic History  . Marital status: Married    Spouse name: Not on file  . Number of children: 0  . Years of education: Not on file  . Highest education level: Not on file  Occupational  History  . Occupation: Training and development officer: [employer]  Social Needs  . Financial resource strain: Not on file  . Food insecurity:    Worry: Not on file    Inability: Not on file  . Transportation needs:    Medical: Not on file    Non-medical: Not on file  Tobacco Use  . Smoking status: Never Smoker  . Smokeless tobacco: Never Used  Substance and Sexual Activity  . Alcohol use: Yes    Alcohol/week: 1.0 standard drinks    Types: 1 Glasses of wine per week  . Drug use: No  . Sexual activity: Never    Birth control/protection: Surgical    Comment: BTL  Lifestyle  . Physical activity:    Days per week: Not on file    Minutes per session: Not on file  . Stress: Not on file  Relationships  . Social connections:    Talks on phone: Not on file    Gets together: Not on file    Attends religious service: Not on file    Active member of club or organization: Not on file    Attends meetings of clubs or organizations: Not on file    Relationship status: Not on file  . Intimate partner violence:  Fear of current or ex partner: Not on file    Emotionally abused: Not on file    Physically abused: Not on file    Forced sexual activity: Not on file  Other Topics Concern  . Not on file  Social History Narrative   Married (Husband with Dr. Linna Darner), no children.       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 BREAST ENHANCEMENT SURGERY    . DILATION AND CURETTAGE OF UTERUS     x 2  . HYSTEROSCOPY    . LASIK     eye  . TUBAL LIGATION      Family History  Problem Relation Age of Onset  . Heart disease Mother        pacemaker  . Hypertension Mother   . Depression Mother   . Diabetes Mother   . Dementia Mother   . Cancer Father 45       lung, smoker  . Seizures Brother   . Colon cancer Neg Hx   . Esophageal cancer Neg Hx   . Stomach cancer  Neg Hx     PMHx, SurgHx, SocialHx, FamHx, Medications, and Allergies were reviewed in the Visit Navigator and updated as appropriate.   Patient Active Problem List   Diagnosis Date Noted  . Long-term current use of thyroid hormone replacement therapy 03/28/2017  . Overweight 06/11/2016  . Maxillary sinusitis, acute 04/27/2015  . Osteopenia 04/15/2015  . Hormone replacement therapy 08/12/2014  . Edema 08/12/2014  . Insomnia 08/12/2014  . Amnesia, global, transient   . Hyperlipidemia   . GERD (gastroesophageal reflux disease)   . Hypothyroidism   . Sleep apnea   . Hx of colonic polyps   . Anxiety   . Leukoplakia, vulva   . B12 deficiency 08/03/2011    Social History   Tobacco Use  . Smoking status: Never Smoker  . Smokeless tobacco: Never Used  Substance Use Topics  . Alcohol use: Yes    Alcohol/week: 1.0 standard drinks    Types: 1 Glasses of wine per week  . Drug use: No    Current Medications and Allergies:    Current Outpatient Medications:  .  atorvastatin (LIPITOR) 40 MG tablet, TAKE 1 TABLET ONCE DAILY AT 6 PM., Disp: 90 tablet, Rfl: 2 .  Biotin 5 MG/ML LIQD, Take 5,000 mcg by mouth daily as needed. , Disp: , Rfl:  .  Cholecalciferol (VITAMIN D3) 1000 units CAPS, Take 1 capsule by mouth daily., Disp: , Rfl:  .  citalopram (CELEXA) 10 MG tablet, TAKE 1 TABLET ONCE DAILY., Disp: 90 tablet, Rfl: 1 .  esomeprazole (NEXIUM) 40 MG capsule, TAKE 1 CAPSULE ONCE DAILY BEFORE BREAKFAST., Disp: 90 capsule, Rfl: 1 .  Estradiol-Estriol-Progesterone (BIEST/PROGESTERONE TD), Take 1 tablet by mouth 2 (two) times daily., Disp: , Rfl:  .  famotidine (PEPCID) 20 MG tablet, Take 20 mg by mouth daily. , Disp: , Rfl:  .  furosemide (LASIX) 20 MG tablet, Take 1 tablet (20 mg total) by mouth daily as needed for fluid or edema., Disp: 90 tablet, Rfl: 1 .  levothyroxine (SYNTHROID, LEVOTHROID) 25 MCG tablet, Take 1 tablet (25 mcg total) by mouth daily., Disp: 30 tablet, Rfl: 5 .   meloxicam (MOBIC) 15 MG tablet, meloxicam 15 mg tablet  Take 1 tablet every day by oral route for 30 days., Disp: ,  Rfl:  .  naproxen sodium (ALEVE) 220 MG tablet, Take 220 mg by mouth daily as needed. , Disp: , Rfl:  .  zolpidem (AMBIEN) 10 MG tablet, TAKE 1/2 TABLET AT BEDTIME AS NEEDED FOR SLEEP., Disp: 15 tablet, Rfl: 2  No Known Allergies  Review of Systems   ROS  Negative unless otherwise specified per HPI.  Vitals:   Vitals:   12/18/17 0859  BP: 110/76  Pulse: 76  Temp: 97.7 F (36.5 C)  TempSrc: Oral  SpO2: 98%  Weight: 172 lb 3.2 oz (78.1 kg)  Height: 5\' 3"  (1.6 m)     Body mass index is 30.5 kg/m.   Physical Exam:    Physical Exam  Constitutional: She appears well-developed. She is cooperative.  Non-toxic appearance. She does not have a sickly appearance. She does not appear ill. No distress.  Cardiovascular: Normal rate, regular rhythm, S1 normal, S2 normal, normal heart sounds and normal pulses.  No LE edema  Pulmonary/Chest: Effort normal and breath sounds normal.  Neurological: She is alert. GCS eye subscore is 4. GCS verbal subscore is 5. GCS motor subscore is 6.  Skin: Skin is warm, dry and intact.  Psychiatric: She has a normal mood and affect. Her speech is normal and behavior is normal.  Nursing note and vitals reviewed.    Assessment and Plan:    Shateria was seen today for lab results.  Diagnoses and all orders for this visit:  Sensation of skin crawling Improving. Will administer B12 injections. Provided reassurance. If symptoms persist, consider ENT referral.  Decreased renal function Continue to work on hydration. Re-check CMP today. -     Comprehensive metabolic panel  Vitamin B 12 deficiency Discussed with patient. She would like to start the Vitamin B12 protocol. Received first injection today and tolerated well.  . Reviewed expectations re: course of current medical issues. . Discussed self-management of symptoms. . Outlined signs  and symptoms indicating need for more acute intervention. . Patient verbalized understanding and all questions were answered. . See orders for this visit as documented in the electronic medical record. . Patient received an After Visit Summary.  Inda Coke, PA-C [place] [place] 12/18/2017  Follow-up: No follow-ups on file.

## 2017-12-18 NOTE — Patient Instructions (Signed)
It was great to see you!  Our B12 protocol is weekly for 4 weeks and then monthly for 3 months.  Please make sure that you make a nurse visit to return for your next injection.  I will reach out to you once your labs are back. Continue to work on mindful eating and hydration.  Take care,  Inda Coke PA-C

## 2017-12-24 ENCOUNTER — Ambulatory Visit (INDEPENDENT_AMBULATORY_CARE_PROVIDER_SITE_OTHER): Payer: Medicare Other | Admitting: Surgical

## 2017-12-24 ENCOUNTER — Encounter: Payer: Self-pay | Admitting: Surgical

## 2017-12-24 DIAGNOSIS — E538 Deficiency of other specified B group vitamins: Secondary | ICD-10-CM

## 2017-12-24 MED ORDER — CYANOCOBALAMIN 1000 MCG/ML IJ SOLN
1000.0000 ug | Freq: Once | INTRAMUSCULAR | Status: AC
  Administered 2017-12-24: 1000 ug via INTRAMUSCULAR

## 2017-12-24 NOTE — Progress Notes (Signed)
Patient comes in today for her second B 12 injection. B 12 given in right deltoid. Patient tolerated well. Patient has next B 12 injection scheduled.

## 2017-12-31 ENCOUNTER — Ambulatory Visit (INDEPENDENT_AMBULATORY_CARE_PROVIDER_SITE_OTHER): Payer: Medicare Other

## 2017-12-31 DIAGNOSIS — E538 Deficiency of other specified B group vitamins: Secondary | ICD-10-CM

## 2017-12-31 MED ORDER — CYANOCOBALAMIN 1000 MCG/ML IJ SOLN
1000.0000 ug | Freq: Once | INTRAMUSCULAR | Status: AC
  Administered 2017-12-31: 1000 ug via INTRAMUSCULAR

## 2017-12-31 NOTE — Progress Notes (Signed)
Patient in today for B12 injection. Administered in left arm. Patient tolerated well.

## 2018-01-07 ENCOUNTER — Encounter: Payer: Self-pay | Admitting: Family Medicine

## 2018-01-07 ENCOUNTER — Ambulatory Visit (INDEPENDENT_AMBULATORY_CARE_PROVIDER_SITE_OTHER): Payer: Medicare Other | Admitting: Family Medicine

## 2018-01-07 VITALS — BP 116/70 | HR 74 | Temp 97.7°F | Ht 63.0 in | Wt 172.0 lb

## 2018-01-07 DIAGNOSIS — E538 Deficiency of other specified B group vitamins: Secondary | ICD-10-CM

## 2018-01-07 DIAGNOSIS — S90852A Superficial foreign body, left foot, initial encounter: Secondary | ICD-10-CM

## 2018-01-07 MED ORDER — CYANOCOBALAMIN 1000 MCG/ML IJ SOLN
1000.0000 ug | Freq: Once | INTRAMUSCULAR | Status: AC
  Administered 2018-01-07: 1000 ug via INTRAMUSCULAR

## 2018-01-07 NOTE — Progress Notes (Signed)
   Subjective:  Megan Booth is a 72 y.o. female who presents today for same day appintment with a chief complaint of foreign body in left foot.  HPI:  Foreign body in foot, acute problem  Noticed a few days ago on the bottom of her left foot. No pain. No redness. No swelling.   ROS: Per HPI  Objective:  Physical Exam: BP 116/70 (BP Location: Left Arm, Patient Position: Sitting, Cuff Size: Normal)   Pulse 74   Temp 97.7 F (36.5 C) (Oral)   Ht 5\' 3"  (1.6 m)   Wt 172 lb (78 kg)   SpO2 97%   BMI 30.47 kg/m   Gen: NAD, resting comfortably Foot: small 2 mm foreign body in plantar aspect of left foot. No surrounding redness or erythema.   Foreign body removal procedure note: Verbal consent was obtained. The area was cleansed with an alcohol pad. Forceps were used to separate  the foreign body from the overlying skin. The foreign body was then grasped and successfully removed. Patient tolerated well without complication. No blood loss. The area was not bandaged as the epidermal layer was not penetrated.   Assessment/Plan:  Foreign body in left foot. Removed today. Appears consistent with wood splinter. See above procedure note. Given that the foreign body did not penetrate her epidermis, and there are no signs of infection, do not need to cover with antibiotics or tetanus vaccine today. Recommended use of OTC analgesics if needed.    Algis Greenhouse. Jerline Pain, MD 01/07/2018 10:34 AM

## 2018-01-07 NOTE — Patient Instructions (Addendum)
It was very nice to see you today!  We removed your splinter today.   Take care, Dr Hampton Abbot

## 2018-02-11 ENCOUNTER — Ambulatory Visit (INDEPENDENT_AMBULATORY_CARE_PROVIDER_SITE_OTHER): Payer: Medicare Other

## 2018-02-11 DIAGNOSIS — Z23 Encounter for immunization: Secondary | ICD-10-CM | POA: Diagnosis not present

## 2018-02-11 DIAGNOSIS — E538 Deficiency of other specified B group vitamins: Secondary | ICD-10-CM | POA: Diagnosis not present

## 2018-02-11 MED ORDER — CYANOCOBALAMIN 1000 MCG/ML IJ SOLN
1000.0000 ug | Freq: Once | INTRAMUSCULAR | Status: AC
  Administered 2018-02-11: 1000 ug via INTRAMUSCULAR

## 2018-02-11 NOTE — Progress Notes (Signed)
Patient here today for B12 injection due to B12 deficiency. Administered in right arm. Tolerated well.  Administered Flu vaccine in left arm. VIS given. Tolerated well

## 2018-02-11 NOTE — Patient Instructions (Signed)
There are no preventive care reminders to display for this patient.  Depression screen PHQ 2/9 12/04/2017 09/14/2016 06/11/2016  Decreased Interest 0 0 0  Down, Depressed, Hopeless 0 0 0  PHQ - 2 Score 0 0 0   

## 2018-03-02 ENCOUNTER — Other Ambulatory Visit: Payer: Self-pay | Admitting: Family Medicine

## 2018-03-18 ENCOUNTER — Ambulatory Visit (INDEPENDENT_AMBULATORY_CARE_PROVIDER_SITE_OTHER): Payer: Medicare Other

## 2018-03-18 DIAGNOSIS — E538 Deficiency of other specified B group vitamins: Secondary | ICD-10-CM | POA: Diagnosis not present

## 2018-03-18 MED ORDER — CYANOCOBALAMIN 1000 MCG/ML IJ SOLN
1000.0000 ug | Freq: Once | INTRAMUSCULAR | Status: AC
  Administered 2018-03-18: 1000 ug via INTRAMUSCULAR

## 2018-03-18 NOTE — Progress Notes (Signed)
Per orders of Dr.Hunter, injection of Vitamin B 12 1000 mcg   given by Loralyn Freshwater.Given in left deltoid. Patient tolerated injection well.

## 2018-03-28 ENCOUNTER — Other Ambulatory Visit: Payer: Self-pay | Admitting: Family Medicine

## 2018-03-31 ENCOUNTER — Other Ambulatory Visit: Payer: Self-pay | Admitting: Family Medicine

## 2018-04-29 ENCOUNTER — Ambulatory Visit (INDEPENDENT_AMBULATORY_CARE_PROVIDER_SITE_OTHER): Payer: Medicare Other

## 2018-04-29 DIAGNOSIS — E538 Deficiency of other specified B group vitamins: Secondary | ICD-10-CM

## 2018-04-29 MED ORDER — CYANOCOBALAMIN 1000 MCG/ML IJ SOLN
1000.0000 ug | Freq: Once | INTRAMUSCULAR | Status: AC
  Administered 2018-04-29: 1000 ug via INTRAMUSCULAR

## 2018-04-29 NOTE — Progress Notes (Signed)
Per orders of Dr. Yong Channel, injection of Vitamin b12 1000 mcg given right deltoid IM by Kevan Ny, CMA Patient tolerated injection well.  She will schedule her next injection for 1 month from today.

## 2018-05-01 DIAGNOSIS — M79601 Pain in right arm: Secondary | ICD-10-CM | POA: Diagnosis not present

## 2018-05-12 DIAGNOSIS — R140 Abdominal distension (gaseous): Secondary | ICD-10-CM | POA: Diagnosis not present

## 2018-05-12 DIAGNOSIS — N950 Postmenopausal bleeding: Secondary | ICD-10-CM | POA: Diagnosis not present

## 2018-05-26 DIAGNOSIS — N950 Postmenopausal bleeding: Secondary | ICD-10-CM | POA: Diagnosis not present

## 2018-05-30 ENCOUNTER — Other Ambulatory Visit: Payer: Self-pay | Admitting: Obstetrics and Gynecology

## 2018-05-30 DIAGNOSIS — N840 Polyp of corpus uteri: Secondary | ICD-10-CM | POA: Diagnosis not present

## 2018-05-30 DIAGNOSIS — N950 Postmenopausal bleeding: Secondary | ICD-10-CM | POA: Diagnosis not present

## 2018-06-03 ENCOUNTER — Ambulatory Visit (INDEPENDENT_AMBULATORY_CARE_PROVIDER_SITE_OTHER): Payer: Medicare Other

## 2018-06-03 DIAGNOSIS — E538 Deficiency of other specified B group vitamins: Secondary | ICD-10-CM

## 2018-06-03 MED ORDER — CYANOCOBALAMIN 1000 MCG/ML IJ SOLN
1000.0000 ug | Freq: Once | INTRAMUSCULAR | Status: AC
  Administered 2018-06-03: 1000 ug via INTRAMUSCULAR

## 2018-06-03 NOTE — Patient Instructions (Signed)
There are no preventive care reminders to display for this patient.  Depression screen [facility] 2/9 12/04/2017 09/14/2016 06/11/2016  Decreased Interest 0 0 0  Down, Depressed, Hopeless 0 0 0  PHQ - 2 Score 0 0 0

## 2018-06-03 NOTE — Progress Notes (Signed)
Patient here today for B12 injection. Administered in left arm. Tolerated well.

## 2018-06-05 DIAGNOSIS — M79641 Pain in right hand: Secondary | ICD-10-CM | POA: Diagnosis not present

## 2018-06-19 DIAGNOSIS — Z09 Encounter for follow-up examination after completed treatment for conditions other than malignant neoplasm: Secondary | ICD-10-CM | POA: Diagnosis not present

## 2018-06-26 DIAGNOSIS — E669 Obesity, unspecified: Secondary | ICD-10-CM | POA: Diagnosis not present

## 2018-07-29 DIAGNOSIS — L918 Other hypertrophic disorders of the skin: Secondary | ICD-10-CM | POA: Diagnosis not present

## 2018-07-29 DIAGNOSIS — L821 Other seborrheic keratosis: Secondary | ICD-10-CM | POA: Diagnosis not present

## 2018-07-29 DIAGNOSIS — Z85828 Personal history of other malignant neoplasm of skin: Secondary | ICD-10-CM | POA: Diagnosis not present

## 2018-08-14 ENCOUNTER — Encounter: Payer: Self-pay | Admitting: Family Medicine

## 2018-08-14 ENCOUNTER — Ambulatory Visit (INDEPENDENT_AMBULATORY_CARE_PROVIDER_SITE_OTHER): Payer: Medicare Other | Admitting: Family Medicine

## 2018-08-14 ENCOUNTER — Telehealth: Payer: Self-pay

## 2018-08-14 VITALS — Temp 96.8°F | Ht 63.0 in | Wt 175.0 lb

## 2018-08-14 DIAGNOSIS — N183 Chronic kidney disease, stage 3 unspecified: Secondary | ICD-10-CM

## 2018-08-14 DIAGNOSIS — G4700 Insomnia, unspecified: Secondary | ICD-10-CM | POA: Diagnosis not present

## 2018-08-14 DIAGNOSIS — F419 Anxiety disorder, unspecified: Secondary | ICD-10-CM | POA: Diagnosis not present

## 2018-08-14 DIAGNOSIS — E785 Hyperlipidemia, unspecified: Secondary | ICD-10-CM

## 2018-08-14 DIAGNOSIS — E039 Hypothyroidism, unspecified: Secondary | ICD-10-CM

## 2018-08-14 MED ORDER — ZOLPIDEM TARTRATE 10 MG PO TABS: ORAL_TABLET | ORAL | 2 refills | Status: DC

## 2018-08-14 NOTE — Telephone Encounter (Signed)
[place]- can you schedule her please?

## 2018-08-14 NOTE — Telephone Encounter (Signed)
Copied from [place] [id number]. Topic: General - Other >> Aug 14, 2018  4:48 PM Ivar Drape wrote: Reason for CRM:  Patient needs a lab appt in the next week and an appt to see her provider in 6 months.

## 2018-08-14 NOTE — Patient Instructions (Addendum)
Video visit

## 2018-08-14 NOTE — Progress Notes (Signed)
Phone [phone]   Subjective:  Virtual visit via Video note. Chief complaint: Chief Complaint  Patient presents with  . Hyperlipidemia   This visit type was conducted due to national recommendations for restrictions regarding the COVID-19 Pandemic (e.g. social distancing).  This format is felt to be most appropriate for this patient at this time balancing risks to patient and risks to population by having him in for in person visit.  No physical exam was performed (except for noted visual exam or audio findings with Telehealth visits).    Our team/I connected with Megan Booth on 08/14/18 at  4:00 PM EDT by a video enabled telemedicine application ([email or web address]) and verified that I am speaking with the correct person using two identifiers.  Location patient: Home-O[facility] Location provider: [facility], office Persons participating in the virtual visit:  patient  Our team/I discussed the limitations of evaluation and management by telemedicine and the availability of in person appointments. In light of current covid-19 pandemic, patient also understands that we are trying to protect them by minimizing in office contact if at all possible.  The patient expressed consent for telemedicine visit and agreed to proceed. Patient understands insurance will be billed.   ROS- no reported chest pain or shortness of breath.  Has had difficulty maintaining weight-particularly with COVID-19 pandemic.  No fever chills reported  Past Medical History-  Patient Active Problem List   Diagnosis Date Noted  . Amnesia, global, transient     Priority: High  . Osteopenia 04/15/2015    Priority: Medium  . Hormone replacement therapy 08/12/2014    Priority: Medium  . Insomnia 08/12/2014    Priority: Medium  . Hyperlipidemia     Priority: Medium  . Sleep apnea     Priority: Medium  . Anxiety     Priority: Medium  . Edema 08/12/2014    Priority: Low  . GERD (gastroesophageal reflux disease)     Priority:  Low  . Hx of colonic polyps     Priority: Low  . Leukoplakia, vulva     Priority: Low  . B12 deficiency 08/03/2011    Priority: Low  . Long-term current use of thyroid hormone replacement therapy 03/28/2017  . Overweight 06/11/2016  . Maxillary sinusitis, acute 04/27/2015  . Hypothyroidism     Medications- reviewed and updated Current Outpatient Medications  Medication Sig Dispense Refill  . atorvastatin (LIPITOR) 40 MG tablet TAKE 1 TABLET ONCE DAILY AT 6 PM. 90 tablet 1  . Biotin 5 MG/ML LIQD Take 5,000 mcg by mouth daily as needed.     . Cholecalciferol (VITAMIN D3) 1000 units CAPS Take 1 capsule by mouth daily.    . citalopram (CELEXA) 10 MG tablet TAKE 1 TABLET ONCE DAILY. 90 tablet 1  . Estradiol-Estriol-Progesterone (BIEST/PROGESTERONE TD) Take 1 tablet by mouth 2 (two) times daily.    . famotidine (PEPCID) 20 MG tablet Take 20 mg by mouth daily.     . furosemide (LASIX) 20 MG tablet Take 1 tablet (20 mg total) by mouth daily as needed for fluid or edema. 90 tablet 1  . levothyroxine (SYNTHROID, LEVOTHROID) 25 MCG tablet TAKE 1 TABLET ONCE DAILY. 90 tablet 1  . naproxen sodium (ALEVE) 220 MG tablet Take 220 mg by mouth daily as needed.     . zolpidem (AMBIEN) 10 MG tablet TAKE 1/2 TABLET AT BEDTIME AS NEEDED FOR SLEEP. 15 tablet 2   No current facility-administered medications for this visit.      Objective:  Temp (!) 96.8 F (36 C) (Oral)   Ht 5\' 3"  (1.6 m)   Wt 175 lb (79.4 kg)   BMI 31.00 kg/m  no bp cuff at home Gen: NAD, resting comfortably.  Pleasant/smiling Lungs: nonlabored, normal respiratory rate  Skin: appears dry, no obvious rash Normal speech and upper extremity movement    Assessment and Plan   # CKD III S: I informed patient of CKD III based off several GFR under 60 on previous labs.   I asked about nsaids-  back is doing better- on meloxicam - she has been taking it on and off for 2 months. For last 3 weeks everyday.  A/P: given nsaids for 2  months want to monitor GFR - will come by for labs (not ideal with covid 19 but need to be careful with her thyroid -discussed reducing to once a week max due to CKD III. Dr. Gladstone Lighter should be informed of the CKD III by patient - for edema uses sparing lasix every few months- I think that's reasonable  #hyperlipidemia S: previously controlled on atorvastatin 56m Lab Results  Component Value Date   CHOL 168 11/16/2015   HDL 45.60 11/16/2015   LDLCALC 101 (H) 09/15/2013   LDLDIRECT 102.0 04/29/2017   TRIG 202.0 (H) 11/16/2015   CHOLHDL 4 11/16/2015   A/P: suspect stable with perhaps midl poor control- she will come by for lipid panel tomorrow  #hypothyroidism S: On thyroid medication-levothyroxine 25 mcg  Lab Results  Component Value Date   TSH 1.83 12/04/2017   A/P: suspect stable- update TSH  # anxiety/insomnia S: anxiety doing reasonably well on celexa.  Having to use ambien 5 mg every other day. Denies depressed mood Depression screen PHQ 2/9 08/14/2018  Decreased Interest 0  Down, Depressed, Hopeless 0  PHQ - 2 Score 0  A/P: Insomnia and anxiety are reasonably well controlled-continue Celexa and Ambien  6 months in person hopefully Lab/Order associations: CKD (chronic kidney disease), stage III ([place]) - Plan: CBC, Comprehensive metabolic panel  Hyperlipidemia, unspecified hyperlipidemia type - Plan: Lipid panel  Hypothyroidism, unspecified type - Plan: TSH  Anxiety  Insomnia, unspecified type  Meds ordered this encounter  Medications  . zolpidem (AMBIEN) 10 MG tablet    Sig: TAKE 1/2 TABLET AT BEDTIME AS NEEDED FOR SLEEP.    Dispense:  15 tablet    Refill:  2   Return precautions advised.  Garret Reddish, MD

## 2018-08-15 NOTE — Telephone Encounter (Signed)
Patient is scheduled for both appointments.

## 2018-08-25 ENCOUNTER — Other Ambulatory Visit (INDEPENDENT_AMBULATORY_CARE_PROVIDER_SITE_OTHER): Payer: Medicare Other

## 2018-08-25 ENCOUNTER — Other Ambulatory Visit: Payer: Self-pay

## 2018-08-25 ENCOUNTER — Encounter: Payer: Self-pay | Admitting: Family Medicine

## 2018-08-25 DIAGNOSIS — E785 Hyperlipidemia, unspecified: Secondary | ICD-10-CM | POA: Diagnosis not present

## 2018-08-25 DIAGNOSIS — N183 Chronic kidney disease, stage 3 unspecified: Secondary | ICD-10-CM | POA: Insufficient documentation

## 2018-08-25 DIAGNOSIS — E039 Hypothyroidism, unspecified: Secondary | ICD-10-CM | POA: Diagnosis not present

## 2018-08-25 LAB — LDL CHOLESTEROL, DIRECT: Direct LDL: 106 mg/dL

## 2018-08-25 LAB — CBC
HCT: 39.5 % (ref 36.0–46.0)
Hemoglobin: 13.6 g/dL (ref 12.0–15.0)
MCHC: 34.4 g/dL (ref 30.0–36.0)
MCV: 93 fl (ref 78.0–100.0)
Platelets: 333 10*3/uL (ref 150.0–400.0)
RBC: 4.25 Mil/uL (ref 3.87–5.11)
RDW: 13.6 % (ref 11.5–15.5)
WBC: 7.5 10*3/uL (ref 4.0–10.5)

## 2018-08-25 LAB — COMPREHENSIVE METABOLIC PANEL
ALT: 16 U/L (ref 0–35)
AST: 12 U/L (ref 0–37)
Albumin: 3.9 g/dL (ref 3.5–5.2)
Alkaline Phosphatase: 70 U/L (ref 39–117)
BUN: 17 mg/dL (ref 6–23)
CO2: 23 mEq/L (ref 19–32)
Calcium: 9 mg/dL (ref 8.4–10.5)
Chloride: 104 mEq/L (ref 96–112)
Creatinine, Ser: 0.97 mg/dL (ref 0.40–1.20)
GFR: 56.64 mL/min — ABNORMAL LOW (ref >60.00)
Glucose, Bld: 112 mg/dL — ABNORMAL HIGH (ref 70–99)
Potassium: 3.8 mEq/L (ref 3.5–5.1)
Sodium: 138 mEq/L (ref 135–145)
Total Bilirubin: 0.4 mg/dL (ref 0.2–1.2)
Total Protein: 6.4 g/dL (ref 6.0–8.3)

## 2018-08-25 LAB — LIPID PANEL
Cholesterol: 217 mg/dL — ABNORMAL HIGH (ref 0–200)
HDL: 48.6 mg/dL (ref >39.00)
Total CHOL/HDL Ratio: 4
Triglycerides: 431 mg/dL — ABNORMAL HIGH (ref 0.0–149.0)

## 2018-08-25 LAB — TSH: TSH: 4.03 u[IU]/mL (ref 0.35–4.50)

## 2018-08-28 ENCOUNTER — Telehealth: Payer: Self-pay

## 2018-08-28 NOTE — Telephone Encounter (Signed)
Copied from [place] [id number]. Topic: General - Other >> Aug 26, 2018  3:06 PM Celene Kras A wrote: Reason for CRM: Pt called to return calls to [facility]. Please advise.   Called pt again with no answer. View last lab result notes.

## 2018-09-08 ENCOUNTER — Other Ambulatory Visit: Payer: Self-pay | Admitting: Family Medicine

## 2018-09-22 ENCOUNTER — Ambulatory Visit (INDEPENDENT_AMBULATORY_CARE_PROVIDER_SITE_OTHER): Payer: Medicare Other | Admitting: Family Medicine

## 2018-09-22 ENCOUNTER — Encounter: Payer: Self-pay | Admitting: Family Medicine

## 2018-09-22 VITALS — Temp 98.6°F | Ht 63.0 in

## 2018-09-22 DIAGNOSIS — M25512 Pain in left shoulder: Secondary | ICD-10-CM

## 2018-09-22 DIAGNOSIS — M25511 Pain in right shoulder: Secondary | ICD-10-CM

## 2018-09-22 DIAGNOSIS — M542 Cervicalgia: Secondary | ICD-10-CM

## 2018-09-22 DIAGNOSIS — N183 Chronic kidney disease, stage 3 unspecified: Secondary | ICD-10-CM

## 2018-09-22 MED ORDER — CYCLOBENZAPRINE HCL 5 MG PO TABS: 5.0000 mg | ORAL_TABLET | Freq: Three times a day (TID) | ORAL | 0 refills | Status: DC | PRN

## 2018-09-22 NOTE — Progress Notes (Signed)
Phone [phone]   Subjective:  Virtual visit via Video note. Chief complaint: Chief Complaint  Patient presents with  . Neck Pain    x 3days    This visit type was conducted due to national recommendations for restrictions regarding the COVID-19 Pandemic (e.g. social distancing).  This format is felt to be most appropriate for this patient at this time balancing risks to patient and risks to population by having him in for in person visit.  No physical exam was performed (except for noted visual exam or audio findings with Telehealth visits).    Our team/I connected with Mosetta Pigeon at  2:40 PM EDT by a video enabled telemedicine application (doxy.me or caregility through epic) and verified that I am speaking with the correct person using two identifiers.  Location patient: Home-O2 Location provider: [facility], office Persons participating in the virtual visit:  patient  Our team/I discussed the limitations of evaluation and management by telemedicine and the availability of in person appointments. In light of current covid-19 pandemic, patient also understands that we are trying to protect them by minimizing in office contact if at all possible.  The patient expressed consent for telemedicine visit and agreed to proceed. Patient understands insurance will be billed.   ROS- no extremity weakness or paresthesias. No low back pain reproted . No fever/chills.   Past Medical History-  Patient Active Problem List   Diagnosis Date Noted  . Amnesia, global, transient     Priority: High  . CKD (chronic kidney disease), stage III ([place]) 08/25/2018    Priority: Medium  . Osteopenia 04/15/2015    Priority: Medium  . Hormone replacement therapy 08/12/2014    Priority: Medium  . Insomnia 08/12/2014    Priority: Medium  . Hyperlipidemia     Priority: Medium  . Sleep apnea     Priority: Medium  . Anxiety     Priority: Medium  . Edema 08/12/2014    Priority: Low  . GERD  (gastroesophageal reflux disease)     Priority: Low  . Hx of colonic polyps     Priority: Low  . Leukoplakia, vulva     Priority: Low  . B12 deficiency 08/03/2011    Priority: Low  . Long-term current use of thyroid hormone replacement therapy 03/28/2017  . Overweight 06/11/2016  . Maxillary sinusitis, acute 04/27/2015  . Hypothyroidism     Medications- reviewed and updated Current Outpatient Medications  Medication Sig Dispense Refill  . atorvastatin (LIPITOR) 40 MG tablet TAKE 1 TABLET ONCE DAILY AT 6 PM. 90 tablet 0  . Biotin 5 MG/ML LIQD Take 5,000 mcg by mouth daily as needed.     . Cholecalciferol (VITAMIN D3) 1000 units CAPS Take 1 capsule by mouth daily.    . citalopram (CELEXA) 10 MG tablet TAKE 1 TABLET ONCE DAILY. 90 tablet 1  . Estradiol-Estriol-Progesterone (BIEST/PROGESTERONE TD) Take 1 tablet by mouth 2 (two) times daily.    . famotidine (PEPCID) 20 MG tablet Take 20 mg by mouth daily.     . furosemide (LASIX) 20 MG tablet Take 1 tablet (20 mg total) by mouth daily as needed for fluid or edema. 90 tablet 1  . levothyroxine (SYNTHROID, LEVOTHROID) 25 MCG tablet TAKE 1 TABLET ONCE DAILY. 90 tablet 1  . zolpidem (AMBIEN) 10 MG tablet TAKE 1/2 TABLET AT BEDTIME AS NEEDED FOR SLEEP. 15 tablet 2  . cyclobenzaprine (FLEXERIL) 5 MG tablet Take 1 tablet (5 mg total) by mouth 3 (three) times daily  as needed for muscle spasms (do not drive for 8 hours after taking). 30 tablet 0   No current facility-administered medications for this visit.      Objective:  Temp 98.6 F (37 C)   Ht 5\' 3"  (1.6 m)   BMI 31.00 kg/m  self reported vitals Gen: NAD, resting comfortably Lungs: nonlabored, normal respiratory rate  Skin: appears dry, no obvious rash Able to move neck- reasonable ROM but reports pain    Assessment and Plan   # Neck/shoulder tension S:has been having back of neck and shoulder pain since Friday. Feels like starts at the shoulder and works its way to the neck -  base of neck. 7/10 stabbing pain.  Admits to a lot of stres- mother hand to go to the hospital- she is finally doing better but now in a nursing home which is hard on the patient. Neck feels somewhat stiff. No fever/chills. No weakness or paresthesias in arms. No headache. Hard time sleeping due to discomfort  So far has tried biofreeze, patches, heat, icea, ibuprofen, tylenol 1000mg  BID. Just cant get it to loosen up.   A/P: 73 year old female with neck and shoulder tension/pain which started up after high period of stress with mom being in hospital- likely tension related. Apparently had similar episode years and years ago and muscle relaxant was beneficial- we will retrial. With no obvious paresthesias or arm issues- opted to hold off on prednisone trial.  - advised her to avoid nsaids due to kidneys/CKD III - doubt pmr- no stiffness with thsi -if she has new or worsening symptoms she should let us know  Future Appointments  Date Time Provider Das[place]  02/16/2019 11:00 AM Marin Olp, MD [facility]   Lab/Order associations: Neck pain  Acute pain of both shoulders  CKD (chronic kidney disease), stage III ([place])  Meds ordered this encounter  Medications  . cyclobenzaprine (FLEXERIL) 5 MG tablet    Sig: Take 1 tablet (5 mg total) by mouth 3 (three) times daily as needed for muscle spasms (do not drive for 8 hours after taking).    Dispense:  30 tablet    Refill:  0   Return precautions advised.  Garret Reddish, MD

## 2018-09-22 NOTE — Patient Instructions (Signed)
There are no preventive care reminders to display for this patient.  Depression screen [facility] 2/9 08/14/2018 12/04/2017 09/14/2016  Decreased Interest 0 0 0  Down, Depressed, Hopeless 0 0 0  PHQ - 2 Score 0 0 0

## 2018-09-29 DIAGNOSIS — M79671 Pain in right foot: Secondary | ICD-10-CM | POA: Diagnosis not present

## 2018-10-23 ENCOUNTER — Telehealth: Payer: Self-pay

## 2018-10-23 NOTE — Telephone Encounter (Signed)
Zolpidem 10 mg  Qty 15  BIN [id number] PCN feprx rxgrp [id number]

## 2018-10-23 NOTE — Telephone Encounter (Signed)
Zylpha Sultan (Key: [id number])  This request has received a Favorable outcome.  Please note any additional information provided by FEP/[employer] at the bottom of this request.

## 2018-10-29 NOTE — Telephone Encounter (Signed)
See note

## 2018-10-29 NOTE — Telephone Encounter (Signed)
Pt called requesting an update on PA. Please advise.

## 2018-10-29 NOTE — Telephone Encounter (Signed)
Called pt and advised that PA has been approved.

## 2018-11-02 ENCOUNTER — Other Ambulatory Visit: Payer: Self-pay | Admitting: Family Medicine

## 2018-11-04 ENCOUNTER — Other Ambulatory Visit: Payer: Self-pay | Admitting: Family Medicine

## 2018-11-10 DIAGNOSIS — N950 Postmenopausal bleeding: Secondary | ICD-10-CM | POA: Diagnosis not present

## 2018-11-17 ENCOUNTER — Other Ambulatory Visit: Payer: Self-pay | Admitting: Family Medicine

## 2018-11-24 DIAGNOSIS — N950 Postmenopausal bleeding: Secondary | ICD-10-CM | POA: Diagnosis not present

## 2018-11-25 DIAGNOSIS — N950 Postmenopausal bleeding: Secondary | ICD-10-CM | POA: Diagnosis not present

## 2018-11-26 DIAGNOSIS — Z1231 Encounter for screening mammogram for malignant neoplasm of breast: Secondary | ICD-10-CM | POA: Diagnosis not present

## 2018-11-26 DIAGNOSIS — Z803 Family history of malignant neoplasm of breast: Secondary | ICD-10-CM | POA: Diagnosis not present

## 2018-11-26 LAB — HM MAMMOGRAPHY

## 2018-11-28 ENCOUNTER — Encounter: Payer: Self-pay | Admitting: Family Medicine

## 2018-11-28 NOTE — Progress Notes (Signed)
error 

## 2018-12-02 ENCOUNTER — Telehealth: Payer: Self-pay | Admitting: Family Medicine

## 2018-12-02 NOTE — Telephone Encounter (Signed)
I left a message asking the patient to call me at [phone] to schedule AWV with Courtney. VDM (Dee-Dee) °

## 2018-12-13 ENCOUNTER — Other Ambulatory Visit: Payer: Self-pay | Admitting: Family Medicine

## 2018-12-15 ENCOUNTER — Encounter: Payer: Self-pay | Admitting: Family Medicine

## 2018-12-15 DIAGNOSIS — Z23 Encounter for immunization: Secondary | ICD-10-CM | POA: Diagnosis not present

## 2018-12-16 DIAGNOSIS — E669 Obesity, unspecified: Secondary | ICD-10-CM | POA: Diagnosis not present

## 2019-01-06 DIAGNOSIS — L814 Other melanin hyperpigmentation: Secondary | ICD-10-CM | POA: Diagnosis not present

## 2019-01-06 DIAGNOSIS — Z85828 Personal history of other malignant neoplasm of skin: Secondary | ICD-10-CM | POA: Diagnosis not present

## 2019-01-06 DIAGNOSIS — L304 Erythema intertrigo: Secondary | ICD-10-CM | POA: Diagnosis not present

## 2019-01-06 DIAGNOSIS — D485 Neoplasm of uncertain behavior of skin: Secondary | ICD-10-CM | POA: Diagnosis not present

## 2019-01-06 DIAGNOSIS — D2271 Melanocytic nevi of right lower limb, including hip: Secondary | ICD-10-CM | POA: Diagnosis not present

## 2019-01-06 DIAGNOSIS — L821 Other seborrheic keratosis: Secondary | ICD-10-CM | POA: Diagnosis not present

## 2019-01-06 DIAGNOSIS — D1801 Hemangioma of skin and subcutaneous tissue: Secondary | ICD-10-CM | POA: Diagnosis not present

## 2019-01-06 DIAGNOSIS — D225 Melanocytic nevi of trunk: Secondary | ICD-10-CM | POA: Diagnosis not present

## 2019-01-06 DIAGNOSIS — C44722 Squamous cell carcinoma of skin of right lower limb, including hip: Secondary | ICD-10-CM | POA: Diagnosis not present

## 2019-01-27 DIAGNOSIS — H43813 Vitreous degeneration, bilateral: Secondary | ICD-10-CM | POA: Diagnosis not present

## 2019-02-16 ENCOUNTER — Ambulatory Visit: Payer: Medicare Other | Admitting: Family Medicine

## 2019-02-17 ENCOUNTER — Other Ambulatory Visit: Payer: Self-pay | Admitting: Family Medicine

## 2019-02-20 DIAGNOSIS — M79671 Pain in right foot: Secondary | ICD-10-CM | POA: Diagnosis not present

## 2019-02-23 NOTE — Telephone Encounter (Signed)
Copied from [place] [id number]. Topic: Quick Communication - Rx Refill/Question >> Feb 23, 2019  9:44 AM Carolyn Stare wrote: Pt call to fup on refill req    Furosemide 20 MG TAKE ONE TABLET DAILY AS NEEDED FOR FLUID OR EDEMA.  Protocol Details  Citalopram Hydrobromide 10 MG TAKE 1 TABLET ONCE DAILY.  Protocol Details  Zolpidem Tartrate TAKE 1/2 TABLET AT BEDTIME AS NEEDED FOR SLEEP.  Protocol Details  Levothyroxine Sodium 25 MCG TAKE 1 TABLET ONCE DAILY.

## 2019-02-23 NOTE — Telephone Encounter (Signed)
She needs a follow up visit - q6 months to check kidneys ideally

## 2019-02-28 ENCOUNTER — Encounter: Payer: Self-pay | Admitting: Family Medicine

## 2019-03-02 ENCOUNTER — Telehealth: Payer: Self-pay | Admitting: Family Medicine

## 2019-03-02 NOTE — Telephone Encounter (Signed)
I called the patient to schedule her AWV. She said that she will call back to schedule it when she returns from vacation.

## 2019-03-03 DIAGNOSIS — E349 Endocrine disorder, unspecified: Secondary | ICD-10-CM | POA: Diagnosis not present

## 2019-03-16 ENCOUNTER — Other Ambulatory Visit: Payer: Self-pay | Admitting: Family Medicine

## 2019-03-17 ENCOUNTER — Other Ambulatory Visit: Payer: Self-pay

## 2019-03-23 DIAGNOSIS — N950 Postmenopausal bleeding: Secondary | ICD-10-CM | POA: Diagnosis not present

## 2019-03-23 DIAGNOSIS — N951 Menopausal and female climacteric states: Secondary | ICD-10-CM | POA: Diagnosis not present

## 2019-03-27 DIAGNOSIS — M79671 Pain in right foot: Secondary | ICD-10-CM | POA: Diagnosis not present

## 2019-04-04 DIAGNOSIS — M79671 Pain in right foot: Secondary | ICD-10-CM | POA: Diagnosis not present

## 2019-04-14 ENCOUNTER — Encounter: Payer: Self-pay | Admitting: Gastroenterology

## 2019-04-24 HISTORY — PX: POLYPECTOMY: SHX149

## 2019-04-24 HISTORY — PX: COLONOSCOPY: SHX174

## 2019-04-28 ENCOUNTER — Ambulatory Visit: Payer: Medicare Other | Attending: Family Medicine

## 2019-04-28 DIAGNOSIS — Z20822 Contact with and (suspected) exposure to covid-19: Secondary | ICD-10-CM | POA: Diagnosis not present

## 2019-04-29 ENCOUNTER — Other Ambulatory Visit: Payer: Medicare Other

## 2019-04-30 LAB — NOVEL CORONAVIRUS, NAA: SARS-CoV-2, NAA: NOT DETECTED

## 2019-05-03 ENCOUNTER — Encounter: Payer: Self-pay | Admitting: Family Medicine

## 2019-05-04 ENCOUNTER — Other Ambulatory Visit: Payer: Self-pay

## 2019-05-04 DIAGNOSIS — M8580 Other specified disorders of bone density and structure, unspecified site: Secondary | ICD-10-CM

## 2019-05-04 DIAGNOSIS — Z780 Asymptomatic menopausal state: Secondary | ICD-10-CM

## 2019-05-08 DIAGNOSIS — M85851 Other specified disorders of bone density and structure, right thigh: Secondary | ICD-10-CM | POA: Diagnosis not present

## 2019-05-08 DIAGNOSIS — M85852 Other specified disorders of bone density and structure, left thigh: Secondary | ICD-10-CM | POA: Diagnosis not present

## 2019-05-08 LAB — HM DEXA SCAN

## 2019-05-10 ENCOUNTER — Encounter: Payer: Self-pay | Admitting: Family Medicine

## 2019-05-12 ENCOUNTER — Ambulatory Visit: Payer: Medicare Other | Attending: Internal Medicine

## 2019-05-12 DIAGNOSIS — Z23 Encounter for immunization: Secondary | ICD-10-CM | POA: Diagnosis not present

## 2019-05-12 NOTE — Progress Notes (Signed)
   Covid-19 Vaccination Clinic  Name:  MAGDELENE BRIZENDINE    MRN: [id number] DOB: 1946-01-17  05/12/2019  Ms. Junius was observed post Covid-19 immunization for 15 minutes without incidence. She was provided with Vaccine Information Sheet and instruction to access the V-Safe system.   Ms. Goding was instructed to call 911 with any severe reactions post vaccine: Marland Kitchen Difficulty breathing  . Swelling of your face and throat  . A fast heartbeat  . A bad rash all over your body  . Dizziness and weakness    Immunizations Administered    Name Date Dose VIS Date Route   Pfizer COVID-19 Vaccine 05/12/2019 12:36 PM 0.3 mL 04/03/2019 Intramuscular   Manufacturer: [place]   Lot: [id number]   [place]: [id number]

## 2019-05-15 DIAGNOSIS — E669 Obesity, unspecified: Secondary | ICD-10-CM | POA: Diagnosis not present

## 2019-05-16 ENCOUNTER — Other Ambulatory Visit: Payer: Self-pay | Admitting: Family Medicine

## 2019-05-20 ENCOUNTER — Ambulatory Visit (INDEPENDENT_AMBULATORY_CARE_PROVIDER_SITE_OTHER): Payer: Medicare Other | Admitting: Gastroenterology

## 2019-05-20 ENCOUNTER — Other Ambulatory Visit: Payer: Self-pay

## 2019-05-20 ENCOUNTER — Encounter: Payer: Self-pay | Admitting: Gastroenterology

## 2019-05-20 ENCOUNTER — Encounter: Payer: Self-pay | Admitting: Family Medicine

## 2019-05-20 DIAGNOSIS — R1310 Dysphagia, unspecified: Secondary | ICD-10-CM

## 2019-05-20 DIAGNOSIS — Z1211 Encounter for screening for malignant neoplasm of colon: Secondary | ICD-10-CM

## 2019-05-20 MED ORDER — SUPREP BOWEL PREP KIT 17.5-3.13-1.6 GM/177ML PO SOLN: 1.0000 | ORAL | 0 refills | Status: DC

## 2019-05-20 MED ORDER — OMEPRAZOLE 40 MG PO CPDR: 40.0000 mg | DELAYED_RELEASE_CAPSULE | Freq: Every day | ORAL | 11 refills | Status: DC

## 2019-05-20 NOTE — Progress Notes (Signed)
Review of pertinent gastrointestinal problems: 1. Schatzki's ring, GERD: EGD, Dr. Delfin Booth, September 2015. Done for "dysphasia and history of Barrett's esophagus". She described what looks like a Schatzki's ring which was dilated with Savary dilators.. This was above a small hiatal hernia. She described Barrett's appearing mucosa, however my review of the pictures I cannot see any Barrett's appearing mucosa. Pathology showed no intestinal metaplasia. Looking back on her records from Dr. Jim Booth, an office note in 2009, he stated that she did not have a history of Barrett's esophagus. EGD Dr. Ardis Booth 2017 for heartburn, dysphagia found a GE junction Schatzki's ring, this was dilated to 20 mm with a TTS balloon.  2. Routine risk for colon cancer: Colonoscopy, Dr. Delfin Booth, 08/2009. Done for routine screening. This showed left sided diverticulosis but was otherwise normal. She was recommended to have repeat colonoscopy for routine risk colon cancer screening at 10 year interval.   HPI: This is a very pleasant 74 year old woman whom I last saw about 3-1/2 years ago at the time of 2017 EGD, see those results summarized above.  At that time I saw Schatzki's ring, dilated to 20 mm.  She did very well for about 3 years.  3 or 4 months ago however she started having some solid and liquid food dysphagia again.  Seems to be slightly progressive.  She has gained weight.  No overt GI bleeding.  She has chronic GERD, lately she has been taking over-the-counter Nexium 20 mg pills.  She takes 2 pills shortly after she wakes up.  She does not eat anything for several hours.  She takes a Pepcid at bedtime every night.  She is having waterbrash type symptoms overnight at times.  She has no abdominal pains, no changes or concerns with her bowels.  Certainly no overt bleeding.   Review of systems: Pertinent positive and negative review of systems were noted in the above HPI section. All other review  negative.   Past Medical History:  Diagnosis Date  . Amnesia, global, transient 2015   see hospital notes  . Anxiety   . Barrett esophagus    Dr. Lajoyce Booth  . Diverticulosis   . GERD (gastroesophageal reflux disease)   . Hx of colonic polyps    Dr. Lajoyce Booth   . Hyperlipidemia   . Hypothyroidism   . Internal hemorrhoid   . Leukoplakia, vulva   . Sleep apnea     Past Surgical History:  Procedure Laterality Date  . BREAST ENHANCEMENT SURGERY    . DILATION AND CURETTAGE OF UTERUS     x 2  . HYSTEROSCOPY    . LASIK     eye  . TUBAL LIGATION      Current Outpatient Medications  Medication Sig Dispense Refill  . acetaminophen (TYLENOL) 500 MG tablet Take 500 mg by mouth every 6 (six) hours as needed.    Marland Kitchen atorvastatin (LIPITOR) 40 MG tablet TAKE 1 TABLET ONCE DAILY AT 6 PM. 90 tablet 0  . Biotin 5 MG/ML LIQD Take 5,000 mcg by mouth daily as needed.     . Cholecalciferol (VITAMIN D3) 1000 units CAPS Take 1 capsule by mouth daily.    . Estradiol-Estriol-Progesterone (BIEST/PROGESTERONE TD) Take 1 tablet by mouth 2 (two) times daily.    . famotidine (PEPCID) 20 MG tablet Take 20 mg by mouth daily.     . furosemide (LASIX) 20 MG tablet TAKE ONE TABLET DAILY AS NEEDED FOR FLUID OR EDEMA. 90 tablet 0  . ibuprofen (  ADVIL) 200 MG tablet Take 200 mg by mouth every 6 (six) hours as needed.    Marland Kitchen levothyroxine (SYNTHROID) 25 MCG tablet TAKE 1 TABLET ONCE DAILY. 90 tablet 0  . Melatonin 10 MG TABS Take 1 tablet by mouth daily.    Marland Kitchen PARoxetine (PAXIL) 10 MG tablet Take 1 tablet by mouth daily.    . phentermine (ADIPEX-P) 37.5 MG tablet Take 37.5 mg by mouth daily.    Marland Kitchen topiramate (TOPAMAX) 100 MG tablet Take 50 mg by mouth 2 (two) times daily.     Marland Kitchen zolpidem (AMBIEN) 10 MG tablet TAKE 1/2 TABLET AT BEDTIME AS NEEDED FOR SLEEP. 15 tablet 1  . esomeprazole (NEXIUM) 20 MG capsule Take 1 capsule by mouth daily.     No current facility-administered medications for this visit.    Allergies as of  05/20/2019  . (No Known Allergies)    Family History  Problem Relation Age of Onset  . Heart disease Mother        pacemaker  . Hypertension Mother   . Depression Mother   . Diabetes Mother   . Dementia Mother   . Cancer Father 64       lung, smoker  . Seizures Brother   . Colon cancer Neg Hx   . Esophageal cancer Neg Hx   . Stomach cancer Neg Hx     Social History   Socioeconomic History  . Marital status: Married    Spouse name: Not on file  . Number of children: 0  . Years of education: Not on file  . Highest education level: Not on file  Occupational History  . Occupation: Chiropractor -retired    Fish farm manager: [place] [employer]  Tobacco Use  . Smoking status: Never Smoker  . Smokeless tobacco: Never Used  Substance and Sexual Activity  . Alcohol use: Yes    Alcohol/week: 1.0 standard drinks    Types: 1 Glasses of wine per week    Comment: rarely  . Drug use: No  . Sexual activity: Never    Birth control/protection: Surgical    Comment: BTL  Other Topics Concern  . Not on file  Social History Narrative   Married (Husband with Dr. Jerline Pain she thinks), no children.       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 Difficulty of Paying Living Expenses: Not on file  Food Insecurity:   . Worried About Charity fundraiser in the Last Year: Not on file  . Ran Out of Food in the Last Year: Not on file  Transportation Needs:   . Lack of Transportation (Medical): Not on file  . Lack of Transportation (Non-Medical): Not on file  Physical Activity:   . Days of Exercise per Week: Not on file  . Minutes of Exercise per Session: Not on file  Stress:   . Feeling of Stress : Not on file  Social Connections:   . Frequency of Communication with Friends and Family: Not on file  . Frequency of Social Gatherings with Friends and Family: Not on file  .  Attends Religious Services: Not on file  . Active Member of Clubs or Organizations: Not on file  . Attends Archivist Meetings: Not on file  . Marital Status: Not on file  Intimate Partner Violence:   . Fear of Current or Ex-Partner:  Not on file  . Emotionally Abused: Not on file  . Physically Abused: Not on file  . Sexually Abused: Not on file     Physical Exam: BP 120/70   Pulse 99   Temp 97.8 F (36.6 C)   Ht 5\' 2"  (1.575 m)   Wt 184 lb (83.5 kg)   BMI 33.65 kg/m  Constitutional: generally well-appearing Psychiatric: alert and oriented x3 Eyes: extraocular movements intact Mouth: oral pharynx moist, no lesions Neck: supple no lymphadenopathy Cardiovascular: heart regular rate and rhythm Lungs: clear to auscultation bilaterally Abdomen: soft, nontender, nondistended, no obvious ascites, no peritoneal signs, normal bowel sounds Extremities: no lower extremity edema bilaterally Skin: no lesions on visible extremities   Assessment and plan: 74 y.o. female with routine risk for colon cancer, history of Schatzki's ring, GERD and dysphagia  Since like she has been bothered by her Schatzki's ring again.  She has having mixed solid and liquid food dysphagia.  This is generally the way she presents.  She got over 3 years of relief with EGD, balloon dilation in 2017.  She is not taking proton pump inhibitors at the correct time in relation to meals.  She is going to stop taking her Nexium and I will call her omeprazole prescription strength 40 mg pills she will take 1 pill shortly before her first meal the day which is generally around noon.  She will continue taking Pepcid at bedtime every night.  We will arrange upper endoscopy at her soonest convenience plus minus dilation at that time.  At the same time I offered colonoscopy for her for routine risk colon cancer screening, it has been almost 10 years since her last one and she agreed.  I see no reason for any further blood  tests or imaging studies prior to then.   Please see the "Patient Instructions" section for addition details about the plan.   Owens Loffler, MD [facility] 05/20/2019, 2:21 PM  Cc: Marin Olp, MD  Total time on date of encounter was 45  minutes (this included time spent preparing to see the patient reviewing records; obtaining and/or reviewing separately obtained history; performing a medically appropriate exam and/or evaluation; counseling and educating the patient and family if present; ordering medications, tests or procedures if applicable; and documenting clinical information in the health record).

## 2019-05-20 NOTE — Patient Instructions (Addendum)
If you are age 72 or older, your body mass index should be between 23-30. Your Body mass index is 33.65 kg/m. If this is out of the aforementioned range listed, please consider follow up with your Primary Care Provider.  If you are age 36 or younger, your body mass index should be between 19-25. Your Body mass index is 33.65 kg/m. If this is out of the aformentioned range listed, please consider follow up with your Primary Care Provider.   You have been scheduled for an endoscopy and colonoscopy. Please follow the written instructions given to you at your visit today. Please pick up your prep supplies at the pharmacy within the next 1-3 days. If you use inhalers (even only as needed), please bring them with you on the day of your procedure.  STOP: nexium  We have sent the following medications to your pharmacy for you to pick up at your convenience:  START: omeprazole 40mg  take 1 tablet before lunch meal  Continue: pepcid at bedtime.  Due to recent changes in healthcare laws, you may see the results of your imaging and laboratory studies on MyChart before your provider has had a chance to review them.  We understand that in some cases there may be results that are confusing or concerning to you. Not all laboratory results come back in the same time frame and the provider may be waiting for multiple results in order to interpret others.  Please give [place] 48 hours in order for your provider to thoroughly review all the results before contacting the office for clarification of your results.   Thank you, Dr Ardis Hughs

## 2019-06-09 ENCOUNTER — Ambulatory Visit: Payer: Medicare Other

## 2019-06-09 ENCOUNTER — Other Ambulatory Visit: Payer: Self-pay | Admitting: Family Medicine

## 2019-06-10 ENCOUNTER — Ambulatory Visit: Payer: Medicare Other

## 2019-06-12 ENCOUNTER — Ambulatory Visit: Payer: Medicare Other

## 2019-06-14 ENCOUNTER — Ambulatory Visit: Payer: Medicare Other | Attending: Internal Medicine

## 2019-06-14 DIAGNOSIS — Z23 Encounter for immunization: Secondary | ICD-10-CM | POA: Insufficient documentation

## 2019-06-14 NOTE — Progress Notes (Signed)
   Covid-19 Vaccination Clinic  Name:  Megan Booth    MRN: [id number] DOB: 12-14-1945  06/14/2019  [name] was observed post Covid-19 immunization for 15 minutes without incidence. She was provided with Vaccine Information Sheet and instruction to access the V-Safe system.   [name] was instructed to call 911 with any severe reactions post vaccine: Marland Kitchen Difficulty breathing  . Swelling of your face and throat  . A fast heartbeat  . A bad rash all over your body  . Dizziness and weakness    Immunizations Administered    Name Date Dose VIS Date Route   Pfizer COVID-19 Vaccine 06/14/2019  8:21 AM 0.3 mL 04/03/2019 Intramuscular   Manufacturer: [place]   Lot: [id number]   [place]: [id number]

## 2019-06-15 DIAGNOSIS — M79671 Pain in right foot: Secondary | ICD-10-CM | POA: Diagnosis not present

## 2019-06-24 ENCOUNTER — Encounter: Payer: Self-pay | Admitting: Gastroenterology

## 2019-06-24 ENCOUNTER — Ambulatory Visit (AMBULATORY_SURGERY_CENTER): Payer: Medicare Other | Admitting: Gastroenterology

## 2019-06-24 ENCOUNTER — Other Ambulatory Visit: Payer: Self-pay

## 2019-06-24 ENCOUNTER — Encounter: Payer: Medicare Other | Admitting: Gastroenterology

## 2019-06-24 VITALS — BP 118/72 | HR 77 | Temp 95.9°F | Resp 22 | Ht 62.0 in | Wt 184.0 lb

## 2019-06-24 DIAGNOSIS — Z1211 Encounter for screening for malignant neoplasm of colon: Secondary | ICD-10-CM

## 2019-06-24 DIAGNOSIS — D122 Benign neoplasm of ascending colon: Secondary | ICD-10-CM | POA: Diagnosis not present

## 2019-06-24 DIAGNOSIS — R12 Heartburn: Secondary | ICD-10-CM

## 2019-06-24 DIAGNOSIS — R1310 Dysphagia, unspecified: Secondary | ICD-10-CM

## 2019-06-24 DIAGNOSIS — K449 Diaphragmatic hernia without obstruction or gangrene: Secondary | ICD-10-CM

## 2019-06-24 HISTORY — PX: UPPER GI ENDOSCOPY: SHX6162

## 2019-06-24 HISTORY — PX: OTHER SURGICAL HISTORY: SHX169

## 2019-06-24 MED ORDER — SODIUM CHLORIDE 0.9 % IV SOLN: 500.0000 mL | Freq: Once | INTRAVENOUS | Status: DC

## 2019-06-24 NOTE — Op Note (Signed)
[facility] Patient Name: Megan Booth Procedure Date: 06/24/2019 2:08 PM MRN: [id number] Endoscopist: Milus Banister , MD Age: 74 Referring MD:  Date of Birth: May 14, 1945 Gender: Female Account #: [id number] Procedure:                Colonoscopy Indications:              Screening for colorectal malignant neoplasm;                            Colonoscopy, Dr. Delfin Edis, 08/2009. Done for                            routine screening. This showed left sided                            diverticulosis but was otherwise normal. Medicines:                Monitored Anesthesia Care Procedure:                Pre-Anesthesia Assessment:                           - Prior to the procedure, a History and Physical                            was performed, and patient medications and                            allergies were reviewed. The patient's tolerance of                            previous anesthesia was also reviewed. The risks                            and benefits of the procedure and the sedation                            options and risks were discussed with the patient.                            All questions were answered, and informed consent                            was obtained. Prior Anticoagulants: The patient has                            taken no previous anticoagulant or antiplatelet                            agents. ASA Grade Assessment: II - A patient with                            mild systemic disease. After reviewing the risks  and benefits, the patient was deemed in                            satisfactory condition to undergo the procedure.                           After obtaining informed consent, the colonoscope                            was passed under direct vision. Throughout the                            procedure, the patient's blood pressure, pulse, and                            oxygen saturations were monitored  continuously. The                            Colonoscope was introduced through the anus and                            advanced to the the cecum, identified by                            appendiceal orifice and ileocecal valve. The                            colonoscopy was performed without difficulty. The                            patient tolerated the procedure well. The quality                            of the bowel preparation was good. The ileocecal                            valve, appendiceal orifice, and rectum were                            photographed. Scope In: 2:18:09 PM Scope Out: 2:32:05 PM Scope Withdrawal Time: 0 hours 10 minutes 20 seconds  Total Procedure Duration: 0 hours 13 minutes 56 seconds  Findings:                 Three sessile polyps were found in the ascending                            colon. The polyps were 1 to 2 mm in size. These                            polyps were removed with a cold biopsy forceps.                            Resection and retrieval were complete.  Multiple small-mouthed diverticula were found in                            the left colon.                           The exam was otherwise without abnormality on                            direct and retroflexion views. Complications:            No immediate complications. Estimated blood loss:                            None. Estimated Blood Loss:     Estimated blood loss: none. Impression:               - Three 1 to 2 mm polyps in the ascending colon,                            removed with a cold biopsy forceps. Resected and                            retrieved.                           - Diverticulosis in the left colon.                           - The examination was otherwise normal on direct                            and retroflexion views. Recommendation:           - EGD now.                           - Await pathology results. Milus Banister,  MD 06/24/2019 2:39:58 PM This report has been signed electronically.

## 2019-06-24 NOTE — Progress Notes (Signed)
PT taken to PACU. Monitors in place. VSS. Report given to RN. 

## 2019-06-24 NOTE — Patient Instructions (Signed)
Handouts for polyps, diverticulosis and hiatal hernia.  YOU HAD AN ENDOSCOPIC PROCEDURE TODAY AT [facility]:   Refer to the procedure report that was given to you for any specific questions about what was found during the examination.  If the procedure report does not answer your questions, please call your gastroenterologist to clarify.  If you requested that your care partner not be given the details of your procedure findings, then the procedure report has been included in a sealed envelope for you to review at your convenience later.  YOU SHOULD EXPECT: Some feelings of bloating in the abdomen. Passage of more gas than usual.  Walking can help get rid of the air that was put into your GI tract during the procedure and reduce the bloating. If you had a lower endoscopy (such as a colonoscopy or flexible sigmoidoscopy) you may notice spotting of blood in your stool or on the toilet paper. If you underwent a bowel prep for your procedure, you may not have a normal bowel movement for a few days.  Please Note:  You might notice some irritation and congestion in your nose or some drainage.  This is from the oxygen used during your procedure.  There is no need for concern and it should clear up in a day or so.  SYMPTOMS TO REPORT IMMEDIATELY:   Following lower endoscopy (colonoscopy or flexible sigmoidoscopy):  Excessive amounts of blood in the stool  Significant tenderness or worsening of abdominal pains  Swelling of the abdomen that is new, acute  Fever of 100F or higher   Following upper endoscopy (EGD)  Vomiting of blood or coffee ground material  New chest pain or pain under the shoulder blades  Painful or persistently difficult swallowing  New shortness of breath  Fever of 100F or higher  Black, tarry-looking stools  For urgent or emergent issues, a gastroenterologist can be reached at any hour by calling [phone]. Do not use MyChart messaging for urgent  concerns.    DIET:  We do recommend a small meal at first, but then you may proceed to your regular diet.  Drink plenty of fluids but you should avoid alcoholic beverages for 24 hours.  ACTIVITY:  You should plan to take it easy for the rest of today and you should NOT DRIVE or use heavy machinery until tomorrow (because of the sedation medicines used during the test).    FOLLOW UP: Our staff will call the number listed on your records 48-72 hours following your procedure to check on you and address any questions or concerns that you may have regarding the information given to you following your procedure. If we do not reach you, we will leave a message.  We will attempt to reach you two times.  During this call, we will ask if you have developed any symptoms of COVID 19. If you develop any symptoms (ie: fever, flu-like symptoms, shortness of breath, cough etc.) before then, please call [phone].  If you test positive for Covid 19 in the 2 weeks post procedure, please call and report this information to [place].    If any biopsies were taken you will be contacted by phone or by letter within the next 1-3 weeks.  Please call us at [phone] if you have not heard about the biopsies in 3 weeks.    SIGNATURES/CONFIDENTIALITY: You and/or your care partner have signed paperwork which will be entered into your electronic medical record.  These signatures attest to  the fact that that the information above on your After Visit Summary has been reviewed and is understood.  Full responsibility of the confidentiality of this discharge information lies with you and/or your care-partner.

## 2019-06-24 NOTE — Op Note (Signed)
[facility] Patient Name: Megan Booth Procedure Date: 06/24/2019 2:08 PM MRN: [id number] Endoscopist: Milus Banister , MD Age: 74 Referring MD:  Date of Birth: [birth year] Gender: Female Account #: [id number] Procedure:                Upper GI endoscopy Indications:              Dysphagia, Heartburn Medicines:                Monitored Anesthesia Care Procedure:                Pre-Anesthesia Assessment:                           - Prior to the procedure, a History and Physical                            was performed, and patient medications and                            allergies were reviewed. The patient's tolerance of                            previous anesthesia was also reviewed. The risks                            and benefits of the procedure and the sedation                            options and risks were discussed with the patient.                            All questions were answered, and informed consent                            was obtained. Prior Anticoagulants: The patient has                            taken no previous anticoagulant or antiplatelet                            agents except for aspirin. ASA Grade Assessment: II                            - A patient with mild systemic disease. After                            reviewing the risks and benefits, the patient was                            deemed in satisfactory condition to undergo the                            procedure.  After obtaining informed consent, the endoscope was                            passed under direct vision. Throughout the                            procedure, the patient's blood pressure, pulse, and                            oxygen saturations were monitored continuously. The                            Endoscope was introduced through the mouth, and                            advanced to the second part of duodenum. The upper              GI endoscopy was accomplished without difficulty.                            The patient tolerated the procedure well. Scope In: Scope Out: Findings:                 A medium-sized hiatal hernia was present with a                            possible small paraesophageal component.                           The examination was otherwise normal. Complications:            No immediate complications. Estimated blood loss:                            None. Estimated Blood Loss:     Estimated blood loss: none. Impression:               - Medium-sized hiatal hernia with a possible small                            paraesophageal component.                           - No specimens collected. Recommendation:           - Patient has a contact number available for                            emergencies. The signs and symptoms of potential                            delayed complications were discussed with the                            patient. Return to normal activities tomorrow.  Written discharge instructions were provided to the                            patient.                           - Resume previous diet. Chew your food well, eat                            slowly and take small bites.                           - Continue present medications; omeprazole 40mg                             once daily and bedtime pepcid (famotidine) seems to                            be working quite well.                           - My office will arrange an UGI barium examination                            with radiology to define the hiatal hernia more                            accurately (? small paraesophageal component). Milus Banister, MD 06/24/2019 2:44:37 PM This report has been signed electronically.

## 2019-06-24 NOTE — Progress Notes (Signed)
Called to room to assist during endoscopic procedure.  Patient ID and intended procedure confirmed with present staff. Received instructions for my participation in the procedure from the performing physician.  

## 2019-06-25 ENCOUNTER — Telehealth: Payer: Self-pay

## 2019-06-25 NOTE — Telephone Encounter (Signed)
Can you confirm if you want an UGI series or barium esophagram?

## 2019-06-25 NOTE — Telephone Encounter (Signed)
Per 3/3 procedure report the pt needs to have UGI barium (eval to define the hiatal hernia more accurately (? Small paraesophageal component)

## 2019-06-26 ENCOUNTER — Telehealth: Payer: Self-pay | Admitting: *Deleted

## 2019-06-26 DIAGNOSIS — M76821 Posterior tibial tendinitis, right leg: Secondary | ICD-10-CM | POA: Diagnosis not present

## 2019-06-26 NOTE — Telephone Encounter (Signed)
No answer for post procedure call back. Left message for patient to call back.

## 2019-06-26 NOTE — Telephone Encounter (Signed)
Attempted f/u phone call. No answer. Left message. °

## 2019-06-28 NOTE — Telephone Encounter (Signed)
Whichever study radiology feels will give the best detail about her hiatal hernia.  Thanks

## 2019-06-29 ENCOUNTER — Other Ambulatory Visit: Payer: Self-pay

## 2019-06-29 DIAGNOSIS — K449 Diaphragmatic hernia without obstruction or gangrene: Secondary | ICD-10-CM

## 2019-06-29 DIAGNOSIS — M76821 Posterior tibial tendinitis, right leg: Secondary | ICD-10-CM | POA: Diagnosis not present

## 2019-06-29 NOTE — Progress Notes (Signed)
You have been scheduled for an Upper GI Series at [facility]. Your appointment is on 07/09/19 at 1030 am. Please arrive 15 minutes prior to your test for registration. Make sure not to eat or drink anything after midnight on the night before your test. If you need to reschedule, please call radiology at [phone]. _______________________________________ An upper GI series uses x rays to help diagnose problems of the upper GI tract, which includes the esophagus, stomach, and duodenum. The duodenum is the first part of the small intestine. An upper GI series is conducted by a radiology technologist or a radiologist-a doctor who specializes in x-ray imaging-at a hospital or outpatient center. While sitting or standing in front of an x-ray machine, the patient drinks barium liquid, which is often white and has a chalky consistency and taste. The barium liquid coats the lining of the upper GI tract and makes signs of disease show up more clearly on x rays. X-ray video, called fluoroscopy, is used to view the barium liquid moving through the esophagus, stomach, and duodenum. Additional x rays and fluoroscopy are performed while the patient lies on an x-ray table. To fully coat the upper GI tract with barium liquid, the technologist or radiologist may press on the abdomen or ask the patient to change position. Patients hold still in various positions, allowing the technologist or radiologist to take x rays of the upper GI tract at different angles. If a technologist conducts the upper GI series, a radiologist will later examine the images to look for problems.  This test typically takes about 1 hour to complete.

## 2019-06-29 NOTE — Telephone Encounter (Signed)
You have been scheduled for an Upper GI Series at [facility]. Your appointment is on 07/09/19 at 1030 am. Please arrive 15 minutes prior to your test for registration. Make sure not to eat or drink anything after midnight on the night before your test. If you need to reschedule, please call radiology at [phone]. _______________________________________ An upper GI series uses x rays to help diagnose problems of the upper GI tract, which includes the esophagus, stomach, and duodenum. The duodenum is the first part of the small intestine. An upper GI series is conducted by a radiology technologist or a radiologist--a doctor who specializes in x-ray imaging--at a hospital or outpatient center. While sitting or standing in front of an x-ray machine, the patient drinks barium liquid, which is often white and has a chalky consistency and taste. The barium liquid coats the lining of the upper GI tract and makes signs of disease show up more clearly on x rays. X-ray video, called fluoroscopy, is used to view the barium liquid moving through the esophagus, stomach, and duodenum. Additional x rays and fluoroscopy are performed while the patient lies on an x-ray table. To fully coat the upper GI tract with barium liquid, the technologist or radiologist may press on the abdomen or ask the patient to change position. Patients hold still in various positions, allowing the technologist or radiologist to take x rays of the upper GI tract at different angles. If a technologist conducts the upper GI series, a radiologist will later examine the images to look for problems.   This test typically takes about 1 hour to complete.

## 2019-06-29 NOTE — Telephone Encounter (Signed)
The patient has been notified of this information and all questions answered.

## 2019-07-01 ENCOUNTER — Encounter: Payer: Self-pay | Admitting: Gastroenterology

## 2019-07-03 DIAGNOSIS — M76821 Posterior tibial tendinitis, right leg: Secondary | ICD-10-CM | POA: Diagnosis not present

## 2019-07-06 DIAGNOSIS — M76821 Posterior tibial tendinitis, right leg: Secondary | ICD-10-CM | POA: Diagnosis not present

## 2019-07-09 ENCOUNTER — Other Ambulatory Visit: Payer: Self-pay

## 2019-07-09 ENCOUNTER — Ambulatory Visit (HOSPITAL_COMMUNITY)
Admission: RE | Admit: 2019-07-09 | Discharge: 2019-07-09 | Disposition: A | Discharge status: Home / Self Care | Payer: Medicare Other | Source: Ambulatory Visit | Attending: Gastroenterology | Admitting: Gastroenterology

## 2019-07-09 DIAGNOSIS — K219 Gastro-esophageal reflux disease without esophagitis: Secondary | ICD-10-CM | POA: Diagnosis not present

## 2019-07-09 DIAGNOSIS — K449 Diaphragmatic hernia without obstruction or gangrene: Secondary | ICD-10-CM | POA: Diagnosis not present

## 2019-07-09 DIAGNOSIS — M76821 Posterior tibial tendinitis, right leg: Secondary | ICD-10-CM | POA: Diagnosis not present

## 2019-07-10 ENCOUNTER — Telehealth: Payer: Self-pay | Admitting: Gastroenterology

## 2019-07-10 NOTE — Telephone Encounter (Signed)
Hi Patty, pt called back stating that she changed her mind and would like to be referred to surgeon. I went ahead and cancelled her appt with Dr. Ardis Hughs in April. Thank you.

## 2019-07-10 NOTE — Telephone Encounter (Signed)
Referral made and records faxed to [facility] per pt request

## 2019-07-13 ENCOUNTER — Telehealth: Payer: Self-pay | Admitting: Gastroenterology

## 2019-07-14 DIAGNOSIS — M76821 Posterior tibial tendinitis, right leg: Secondary | ICD-10-CM | POA: Diagnosis not present

## 2019-07-14 NOTE — Telephone Encounter (Signed)
Referral faxed to [facility]. Pt aware. 

## 2019-07-15 ENCOUNTER — Encounter: Payer: Self-pay | Admitting: Family Medicine

## 2019-07-15 DIAGNOSIS — M76821 Posterior tibial tendinitis, right leg: Secondary | ICD-10-CM | POA: Diagnosis not present

## 2019-07-15 DIAGNOSIS — M779 Enthesopathy, unspecified: Secondary | ICD-10-CM

## 2019-07-15 DIAGNOSIS — M79671 Pain in right foot: Secondary | ICD-10-CM | POA: Diagnosis not present

## 2019-07-15 HISTORY — DX: Enthesopathy, unspecified: M77.9

## 2019-07-16 ENCOUNTER — Other Ambulatory Visit: Payer: Self-pay | Admitting: Family Medicine

## 2019-07-16 ENCOUNTER — Encounter: Payer: Self-pay | Admitting: Family Medicine

## 2019-07-16 NOTE — Telephone Encounter (Signed)
Called patient let her know that we do have documented in chart. She was looking under testing tab. Let her know that it would not visible under that tab. No further questions.

## 2019-07-16 NOTE — Telephone Encounter (Signed)
Please call patient to make f/u last seen on 08/14/2018. Will need app made before we can send for refill.

## 2019-07-16 NOTE — Telephone Encounter (Signed)
Called and LVM for pt to schedule appt.

## 2019-07-20 ENCOUNTER — Other Ambulatory Visit: Payer: Self-pay | Admitting: Family Medicine

## 2019-07-21 NOTE — Telephone Encounter (Signed)
Last refill: 11.2.20 #15, 1 Last OV: 4.23.20 dx. CKD

## 2019-07-21 NOTE — Telephone Encounter (Signed)
Almost a year from last visit- needs visit

## 2019-07-23 NOTE — Telephone Encounter (Signed)
LVM for patient to call back and schedule appt with Dr. Hunter.  

## 2019-08-04 DIAGNOSIS — K449 Diaphragmatic hernia without obstruction or gangrene: Secondary | ICD-10-CM | POA: Diagnosis not present

## 2019-08-10 ENCOUNTER — Ambulatory Visit: Payer: Self-pay | Admitting: General Surgery

## 2019-08-13 NOTE — Patient Instructions (Signed)
DUE TO COVID-19 ONLY ONE VISITOR IS ALLOWED TO COME WITH YOU AND STAY IN THE WAITING ROOM ONLY DURING PRE OP AND PROCEDURE DAY OF SURGERY. THE 1 VISITOR MAY VISIT WITH YOU AFTER SURGERY IN YOUR PRIVATE ROOM DURING VISITING HOURS ONLY!  YOU NEED TO HAVE A COVID 19 TEST ON:08/14/19  @ 9:00 am, THIS TEST MUST BE DONE BEFORE SURGERY, COME  [place], [address] [place] , [address].  ([place]) ONCE YOUR COVID TEST IS COMPLETED, PLEASE BEGIN THE QUARANTINE INSTRUCTIONS AS OUTLINED IN YOUR HANDOUT.                LUELLA BOUTILIER    Your procedure is scheduled on: 08/18/19   Report to [facility] Main  Entrance   Report to admitting at: 11:00 AM     Call this number if you have problems the morning of surgery [phone]    Remember:    [place], [place].     Take these medicines the morning of surgery with A SIP OF WATER: famotidine,levothyroxine,omeprazole,paroxetine.                                You may not have any metal on your body including hair pins and              piercings  Do not wear jewelry, make-up, lotions, powders or perfumes, deodorant             Do not wear nail polish on your fingernails.  Do not shave  48 hours prior to surgery.             Do not bring valuables to the hospital. [place].  Contacts, dentures or bridgework may not be worn into surgery.  Leave suitcase in the car. After surgery it may be brought to your room.              NO SOLID FOOD AFTER MIDNIGHT THE NIGHT PRIOR TO SURGERY. NOTHING BY MOUTH EXCEPT CLEAR LIQUIDS UNTIL: 10:00 am . PLEASE FINISH ENSURE DRINK PER SURGEON ORDER  WHICH NEEDS TO BE COMPLETED AT: 10:00 am .   CLEAR LIQUID DIET   Foods Allowed                                                                     Foods Excluded  Coffee and tea, regular and decaf                              liquids that you cannot  Plain Jell-O any favor except red or purple                                           see through such as: Fruit ices (not with fruit pulp)  milk, soups, orange juice  Iced Popsicles                                    All solid food Carbonated beverages, regular and diet                                    Cranberry, grape and apple juices Sports drinks like Gatorade Lightly seasoned clear broth or consume(fat free) Sugar, honey syrup  Sample Menu Breakfast                                Lunch                                     Supper Cranberry juice                    Beef broth                            Chicken broth Jell-O                                     Grape juice                           Apple juice Coffee or tea                        Jell-O                                      Popsicle                                                Coffee or tea                        Coffee or tea  _____________________________________________________________________  [facility] - Preparing for Surgery Before surgery, you can play an important role.  Because skin is not sterile, your skin needs to be as free of germs as possible.  You can reduce the number of germs on your skin by washing with CHG (chlorahexidine gluconate) soap before surgery.  CHG is an antiseptic cleaner which kills germs and bonds with the skin to continue killing germs even after washing. Please DO NOT use if you have an allergy to CHG or antibacterial soaps.  If your skin becomes reddened/irritated stop using the CHG and inform your nurse when you arrive at Short Stay. Do not shave (including legs and underarms) for at least 48 hours prior to the first CHG shower.  You may shave your face/neck. Please follow these instructions carefully:  1.  Shower with CHG Soap the night before surgery and the  morning of Surgery.  2.  If you choose to wash  your hair,  wash your hair first as usual with your  normal  shampoo.  3.  After you shampoo, rinse your hair and body thoroughly to remove the  shampoo.                           4.  Use CHG as you would any other liquid soap.  You can apply chg directly  to the skin and wash                       Gently with a scrungie or clean washcloth.  5.  Apply the CHG Soap to your body ONLY FROM THE NECK DOWN.   Do not use on face/ open                           Wound or open sores. Avoid contact with eyes, ears mouth and genitals (private parts).                       Wash face,  Genitals (private parts) with your normal soap.             6.  Wash thoroughly, paying special attention to the area where your surgery  will be performed.  7.  Thoroughly rinse your body with warm water from the neck down.  8.  DO NOT shower/wash with your normal soap after using and rinsing off  the CHG Soap.                9.  Pat yourself dry with a clean towel.            10.  Wear clean pajamas.            11.  Place clean sheets on your bed the night of your first shower and do not  sleep with pets. Day of Surgery : Do not apply any lotions/deodorants the morning of surgery.  Please wear clean clothes to the hospital/surgery center.  FAILURE TO FOLLOW THESE INSTRUCTIONS MAY RESULT IN THE CANCELLATION OF YOUR SURGERY PATIENT SIGNATURE_________________________________  NURSE SIGNATURE__________________________________  ________________________________________________________________________ [facility] - Preparing for Surgery Before surgery, you can play an important role.  Because skin is not sterile, your skin needs to be as free of germs as possible.  You can reduce the number of germs on your skin by washing with CHG (chlorahexidine gluconate) soap before surgery.  CHG is an antiseptic cleaner which kills germs and bonds with the skin to continue killing germs even after washing. Please DO NOT use if you have an allergy to CHG  or antibacterial soaps.  If your skin becomes reddened/irritated stop using the CHG and inform your nurse when you arrive at Short Stay. Do not shave (including legs and underarms) for at least 48 hours prior to the first CHG shower.  You may shave your face/neck. Please follow these instructions carefully:  1.  Shower with CHG Soap the night before surgery and the  morning of Surgery.  2.  If you choose to wash your hair, wash your hair first as usual with your  normal  shampoo.  3.  After you shampoo, rinse your hair and body thoroughly to remove the  shampoo.  4.  Use CHG as you would any other liquid soap.  You can apply chg directly  to the skin and wash                       Gently with a scrungie or clean washcloth.  5.  Apply the CHG Soap to your body ONLY FROM THE NECK DOWN.   Do not use on face/ open                           Wound or open sores. Avoid contact with eyes, ears mouth and genitals (private parts).                       Wash face,  Genitals (private parts) with your normal soap.             6.  Wash thoroughly, paying special attention to the area where your surgery  will be performed.  7.  Thoroughly rinse your body with warm water from the neck down.  8.  DO NOT shower/wash with your normal soap after using and rinsing off  the CHG Soap.                9.  Pat yourself dry with a clean towel.            10.  Wear clean pajamas.            11.  Place clean sheets on your bed the night of your first shower and do not  sleep with pets. Day of Surgery : Do not apply any lotions/deodorants the morning of surgery.  Please wear clean clothes to the hospital/surgery center.  FAILURE TO FOLLOW THESE INSTRUCTIONS MAY RESULT IN THE CANCELLATION OF YOUR SURGERY PATIENT SIGNATURE_________________________________  NURSE SIGNATURE__________________________________  ________________________________________________________________________

## 2019-08-14 ENCOUNTER — Other Ambulatory Visit (HOSPITAL_COMMUNITY)
Admission: RE | Admit: 2019-08-14 | Discharge: 2019-08-14 | Disposition: A | Discharge status: Home / Self Care | Payer: Medicare Other | Source: Ambulatory Visit | Attending: General Surgery | Admitting: General Surgery

## 2019-08-14 ENCOUNTER — Other Ambulatory Visit: Payer: Self-pay

## 2019-08-14 ENCOUNTER — Encounter (HOSPITAL_COMMUNITY)
Admission: RE | Admit: 2019-08-14 | Discharge: 2019-08-14 | Disposition: A | Discharge status: Home / Self Care | Payer: Medicare Other | Source: Ambulatory Visit | Attending: General Surgery | Admitting: General Surgery

## 2019-08-14 ENCOUNTER — Encounter (HOSPITAL_COMMUNITY): Payer: Self-pay

## 2019-08-14 DIAGNOSIS — Z20822 Contact with and (suspected) exposure to covid-19: Secondary | ICD-10-CM | POA: Insufficient documentation

## 2019-08-14 DIAGNOSIS — Z01812 Encounter for preprocedural laboratory examination: Secondary | ICD-10-CM | POA: Insufficient documentation

## 2019-08-14 HISTORY — DX: Malignant (primary) neoplasm, unspecified: C80.1

## 2019-08-14 LAB — CBC
HCT: 39.7 % (ref 36.0–46.0)
Hemoglobin: 12.8 g/dL (ref 12.0–15.0)
MCH: 31.2 pg (ref 26.0–34.0)
MCHC: 32.2 g/dL (ref 30.0–36.0)
MCV: 96.8 fL (ref 80.0–100.0)
Platelets: 282 10*3/uL (ref 150–400)
RBC: 4.1 MIL/uL (ref 3.87–5.11)
RDW: 13 % (ref 11.5–15.5)
WBC: 8.5 10*3/uL (ref 4.0–10.5)
nRBC: 0 % (ref 0.0–0.2)

## 2019-08-14 LAB — SARS CORONAVIRUS 2 (TAT 6-24 HRS): SARS Coronavirus 2: NEGATIVE

## 2019-08-14 NOTE — Progress Notes (Signed)
PCP - Dr. Ansel Bong Cardiologist -   Chest x-ray -  EKG -  Stress Test -  ECHO -  Cardiac Cath -   Sleep Study -  CPAP -   Fasting Blood Sugar -  Checks Blood Sugar _____ times a day  Blood Thinner Instructions: Aspirin Instructions: Last Dose:  Anesthesia review:   Patient denies shortness of breath, fever, cough and chest pain at PAT appointment   Patient verbalized understanding of instructions that were given to them at the PAT appointment. Patient was also instructed that they will need to review over the PAT instructions again at home before surgery.

## 2019-08-18 ENCOUNTER — Encounter (HOSPITAL_COMMUNITY)
Admission: RE | Disposition: A | Discharge status: Home / Self Care | Payer: Self-pay | Source: Home / Self Care | Attending: General Surgery

## 2019-08-18 ENCOUNTER — Inpatient Hospital Stay (HOSPITAL_COMMUNITY): Payer: Medicare Other | Admitting: Anesthesiology

## 2019-08-18 ENCOUNTER — Encounter (HOSPITAL_COMMUNITY): Payer: Self-pay | Admitting: General Surgery

## 2019-08-18 ENCOUNTER — Inpatient Hospital Stay (HOSPITAL_COMMUNITY)
Admission: RE | Admit: 2019-08-18 | Discharge: 2019-08-20 | DRG: 328 | Disposition: A | Discharge status: Home / Self Care | Payer: Medicare Other | Attending: General Surgery | Admitting: General Surgery

## 2019-08-18 ENCOUNTER — Other Ambulatory Visit: Payer: Self-pay

## 2019-08-18 DIAGNOSIS — Z79890 Hormone replacement therapy (postmenopausal): Secondary | ICD-10-CM

## 2019-08-18 DIAGNOSIS — K219 Gastro-esophageal reflux disease without esophagitis: Secondary | ICD-10-CM | POA: Diagnosis present

## 2019-08-18 DIAGNOSIS — E785 Hyperlipidemia, unspecified: Secondary | ICD-10-CM | POA: Diagnosis present

## 2019-08-18 DIAGNOSIS — Z85828 Personal history of other malignant neoplasm of skin: Secondary | ICD-10-CM

## 2019-08-18 DIAGNOSIS — F419 Anxiety disorder, unspecified: Secondary | ICD-10-CM | POA: Diagnosis present

## 2019-08-18 DIAGNOSIS — E039 Hypothyroidism, unspecified: Secondary | ICD-10-CM | POA: Diagnosis present

## 2019-08-18 DIAGNOSIS — Z8249 Family history of ischemic heart disease and other diseases of the circulatory system: Secondary | ICD-10-CM | POA: Diagnosis not present

## 2019-08-18 DIAGNOSIS — K449 Diaphragmatic hernia without obstruction or gangrene: Secondary | ICD-10-CM | POA: Diagnosis present

## 2019-08-18 DIAGNOSIS — N183 Chronic kidney disease, stage 3 unspecified: Secondary | ICD-10-CM | POA: Diagnosis not present

## 2019-08-18 DIAGNOSIS — Z79899 Other long term (current) drug therapy: Secondary | ICD-10-CM

## 2019-08-18 DIAGNOSIS — Z8719 Personal history of other diseases of the digestive system: Secondary | ICD-10-CM

## 2019-08-18 DIAGNOSIS — Z818 Family history of other mental and behavioral disorders: Secondary | ICD-10-CM

## 2019-08-18 DIAGNOSIS — Z833 Family history of diabetes mellitus: Secondary | ICD-10-CM | POA: Diagnosis not present

## 2019-08-18 DIAGNOSIS — K2270 Barrett's esophagus without dysplasia: Secondary | ICD-10-CM | POA: Diagnosis present

## 2019-08-18 HISTORY — PX: HIATAL HERNIA REPAIR: SHX195

## 2019-08-18 LAB — CBC
HCT: 38.7 % (ref 36.0–46.0)
Hemoglobin: 12.5 g/dL (ref 12.0–15.0)
MCH: 31.3 pg (ref 26.0–34.0)
MCHC: 32.3 g/dL (ref 30.0–36.0)
MCV: 97 fL (ref 80.0–100.0)
Platelets: 249 10*3/uL (ref 150–400)
RBC: 3.99 MIL/uL (ref 3.87–5.11)
RDW: 13.2 % (ref 11.5–15.5)
WBC: 14.2 10*3/uL — ABNORMAL HIGH (ref 4.0–10.5)
nRBC: 0 % (ref 0.0–0.2)

## 2019-08-18 LAB — CREATININE, SERUM
Creatinine, Ser: 1.25 mg/dL — ABNORMAL HIGH (ref 0.44–1.00)
GFR calc Af Amer: 50 mL/min — ABNORMAL LOW (ref >60)
GFR calc non Af Amer: 43 mL/min — ABNORMAL LOW (ref >60)

## 2019-08-18 SURGERY — REPAIR, HERNIA, HIATAL, LAPAROSCOPIC
Anesthesia: General | Site: Abdomen

## 2019-08-18 MED ORDER — ACETAMINOPHEN 500 MG PO TABS
1000.0000 mg | ORAL_TABLET | ORAL | Status: AC
  Administered 2019-08-18: 1000 mg via ORAL
  Filled 2019-08-18: qty 2

## 2019-08-18 MED ORDER — DIPHENHYDRAMINE HCL 50 MG/ML IJ SOLN: 25.0000 mg | Freq: Four times a day (QID) | INTRAMUSCULAR | Status: DC | PRN

## 2019-08-18 MED ORDER — DEXAMETHASONE SODIUM PHOSPHATE 10 MG/ML IJ SOLN
INTRAMUSCULAR | Status: DC | PRN
  Administered 2019-08-18: 5 mg via INTRAVENOUS

## 2019-08-18 MED ORDER — PHENYLEPHRINE HCL-NACL 10-0.9 MG/250ML-% IV SOLN
INTRAVENOUS | Status: DC | PRN
Start: 2019-08-18 — End: 2019-08-18
  Administered 2019-08-18: 35 ug/min via INTRAVENOUS

## 2019-08-18 MED ORDER — PHENYLEPHRINE 40 MCG/ML (10ML) SYRINGE FOR IV PUSH (FOR BLOOD PRESSURE SUPPORT)
PREFILLED_SYRINGE | INTRAVENOUS | Status: DC | PRN
  Administered 2019-08-18 (×2): 120 ug via INTRAVENOUS

## 2019-08-18 MED ORDER — FENTANYL CITRATE (PF) 100 MCG/2ML IJ SOLN: 25.0000 ug | INTRAMUSCULAR | Status: DC | PRN

## 2019-08-18 MED ORDER — PAROXETINE HCL 10 MG PO TABS
10.0000 mg | ORAL_TABLET | Freq: Every day | ORAL | Status: DC
  Administered 2019-08-19 – 2019-08-20 (×2): 10 mg via ORAL
  Filled 2019-08-18 (×2): qty 1

## 2019-08-18 MED ORDER — 0.9 % SODIUM CHLORIDE (POUR BTL) OPTIME
TOPICAL | Status: DC | PRN
  Administered 2019-08-18: 1000 mL

## 2019-08-18 MED ORDER — BUPIVACAINE LIPOSOME 1.3 % IJ SUSP
20.0000 mL | Freq: Once | INTRAMUSCULAR | Status: AC
  Administered 2019-08-18: 20 mL
  Filled 2019-08-18: qty 20

## 2019-08-18 MED ORDER — KETOROLAC TROMETHAMINE 15 MG/ML IJ SOLN
15.0000 mg | INTRAMUSCULAR | Status: DC
  Filled 2019-08-18: qty 1

## 2019-08-18 MED ORDER — SIMETHICONE 80 MG PO CHEW: 40.0000 mg | CHEWABLE_TABLET | Freq: Four times a day (QID) | ORAL | Status: DC | PRN

## 2019-08-18 MED ORDER — MELATONIN 5 MG PO TABS
10.0000 mg | ORAL_TABLET | Freq: Every day | ORAL | Status: DC | PRN
  Administered 2019-08-18: 10 mg via ORAL
  Filled 2019-08-18: qty 2

## 2019-08-18 MED ORDER — ROCURONIUM BROMIDE 10 MG/ML (PF) SYRINGE
PREFILLED_SYRINGE | INTRAVENOUS | Status: AC
  Filled 2019-08-18: qty 10

## 2019-08-18 MED ORDER — KETOROLAC TROMETHAMINE 30 MG/ML IJ SOLN
INTRAMUSCULAR | Status: AC
  Filled 2019-08-18: qty 1

## 2019-08-18 MED ORDER — ROCURONIUM BROMIDE 50 MG/5ML IV SOSY
PREFILLED_SYRINGE | INTRAVENOUS | Status: DC | PRN
  Administered 2019-08-18: 50 mg via INTRAVENOUS

## 2019-08-18 MED ORDER — LACTATED RINGERS IR SOLN
Status: DC | PRN
  Administered 2019-08-18: 1000 mL

## 2019-08-18 MED ORDER — ONDANSETRON 4 MG PO TBDP: 4.0000 mg | ORAL_TABLET | Freq: Four times a day (QID) | ORAL | Status: DC | PRN

## 2019-08-18 MED ORDER — EPHEDRINE 5 MG/ML INJ
INTRAVENOUS | Status: AC
  Filled 2019-08-18: qty 10

## 2019-08-18 MED ORDER — LIDOCAINE 2% (20 MG/ML) 5 ML SYRINGE
INTRAMUSCULAR | Status: AC
  Filled 2019-08-18: qty 5

## 2019-08-18 MED ORDER — KETOROLAC TROMETHAMINE 15 MG/ML IJ SOLN
INTRAMUSCULAR | Status: DC | PRN
  Administered 2019-08-18: 15 mg via INTRAVENOUS

## 2019-08-18 MED ORDER — PHENYLEPHRINE 40 MCG/ML (10ML) SYRINGE FOR IV PUSH (FOR BLOOD PRESSURE SUPPORT)
PREFILLED_SYRINGE | INTRAVENOUS | Status: AC
  Filled 2019-08-18: qty 10

## 2019-08-18 MED ORDER — BUPIVACAINE HCL (PF) 0.25 % IJ SOLN
INTRAMUSCULAR | Status: AC
  Filled 2019-08-18: qty 30

## 2019-08-18 MED ORDER — LACTATED RINGERS IV SOLN: INTRAVENOUS | Status: DC

## 2019-08-18 MED ORDER — SUGAMMADEX SODIUM 200 MG/2ML IV SOLN
INTRAVENOUS | Status: DC | PRN
  Administered 2019-08-18: 200 mg via INTRAVENOUS

## 2019-08-18 MED ORDER — LEVOTHYROXINE SODIUM 25 MCG PO TABS
25.0000 ug | ORAL_TABLET | Freq: Every day | ORAL | Status: DC
  Administered 2019-08-19 – 2019-08-20 (×2): 25 ug via ORAL
  Filled 2019-08-18 (×2): qty 1

## 2019-08-18 MED ORDER — ONDANSETRON HCL 4 MG/2ML IJ SOLN
INTRAMUSCULAR | Status: AC
  Filled 2019-08-18: qty 2

## 2019-08-18 MED ORDER — PANTOPRAZOLE SODIUM 40 MG IV SOLR
40.0000 mg | Freq: Every day | INTRAVENOUS | Status: DC
  Administered 2019-08-18 – 2019-08-19 (×2): 40 mg via INTRAVENOUS
  Filled 2019-08-18 (×2): qty 40

## 2019-08-18 MED ORDER — ZOLPIDEM TARTRATE 5 MG PO TABS: 5.0000 mg | ORAL_TABLET | Freq: Every evening | ORAL | Status: DC | PRN

## 2019-08-18 MED ORDER — ENSURE PRE-SURGERY PO LIQD
296.0000 mL | Freq: Once | ORAL | Status: DC
  Filled 2019-08-18: qty 296

## 2019-08-18 MED ORDER — PROPOFOL 10 MG/ML IV BOLUS
INTRAVENOUS | Status: AC
  Filled 2019-08-18: qty 20

## 2019-08-18 MED ORDER — CHLORHEXIDINE GLUCONATE CLOTH 2 % EX PADS: 6.0000 | MEDICATED_PAD | Freq: Once | CUTANEOUS | Status: DC

## 2019-08-18 MED ORDER — ACETAMINOPHEN 500 MG PO TABS
1000.0000 mg | ORAL_TABLET | Freq: Four times a day (QID) | ORAL | Status: DC
  Administered 2019-08-18 – 2019-08-19 (×3): 1000 mg via ORAL
  Filled 2019-08-18 (×5): qty 2

## 2019-08-18 MED ORDER — ENSURE SURGERY PO LIQD
237.0000 mL | Freq: Two times a day (BID) | ORAL | Status: DC
  Administered 2019-08-19 – 2019-08-20 (×3): 237 mL via ORAL
  Filled 2019-08-18 (×4): qty 237

## 2019-08-18 MED ORDER — MIDAZOLAM HCL 2 MG/2ML IJ SOLN
INTRAMUSCULAR | Status: AC
  Filled 2019-08-18: qty 2

## 2019-08-18 MED ORDER — MORPHINE SULFATE (PF) 2 MG/ML IV SOLN: 2.0000 mg | INTRAVENOUS | Status: DC | PRN

## 2019-08-18 MED ORDER — FENTANYL CITRATE (PF) 250 MCG/5ML IJ SOLN
INTRAMUSCULAR | Status: DC | PRN
  Administered 2019-08-18 (×3): 50 ug via INTRAVENOUS
  Administered 2019-08-18: 100 ug via INTRAVENOUS

## 2019-08-18 MED ORDER — FENTANYL CITRATE (PF) 250 MCG/5ML IJ SOLN
INTRAMUSCULAR | Status: AC
  Filled 2019-08-18: qty 5

## 2019-08-18 MED ORDER — EPHEDRINE SULFATE-NACL 50-0.9 MG/10ML-% IV SOSY
PREFILLED_SYRINGE | INTRAVENOUS | Status: DC | PRN
  Administered 2019-08-18: 10 mg via INTRAVENOUS

## 2019-08-18 MED ORDER — METOPROLOL TARTRATE 5 MG/5ML IV SOLN: 5.0000 mg | Freq: Four times a day (QID) | INTRAVENOUS | Status: DC | PRN

## 2019-08-18 MED ORDER — ONDANSETRON HCL 4 MG/2ML IJ SOLN: 4.0000 mg | Freq: Four times a day (QID) | INTRAMUSCULAR | Status: DC | PRN

## 2019-08-18 MED ORDER — DIPHENHYDRAMINE HCL 25 MG PO CAPS: 25.0000 mg | ORAL_CAPSULE | Freq: Four times a day (QID) | ORAL | Status: DC | PRN

## 2019-08-18 MED ORDER — ONDANSETRON HCL 4 MG/2ML IJ SOLN
INTRAMUSCULAR | Status: DC | PRN
  Administered 2019-08-18: 4 mg via INTRAVENOUS

## 2019-08-18 MED ORDER — PROMETHAZINE HCL 25 MG/ML IJ SOLN: 6.2500 mg | INTRAMUSCULAR | Status: DC | PRN

## 2019-08-18 MED ORDER — KETOROLAC TROMETHAMINE 15 MG/ML IJ SOLN
15.0000 mg | Freq: Four times a day (QID) | INTRAMUSCULAR | Status: DC | PRN
  Administered 2019-08-18: 15 mg via INTRAVENOUS
  Filled 2019-08-18: qty 1

## 2019-08-18 MED ORDER — KETAMINE HCL 10 MG/ML IJ SOLN
INTRAMUSCULAR | Status: DC | PRN
  Administered 2019-08-18: 30 mg via INTRAVENOUS

## 2019-08-18 MED ORDER — SUCCINYLCHOLINE CHLORIDE 200 MG/10ML IV SOSY
PREFILLED_SYRINGE | INTRAVENOUS | Status: AC
  Filled 2019-08-18: qty 10

## 2019-08-18 MED ORDER — ALBUMIN HUMAN 5 % IV SOLN
INTRAVENOUS | Status: AC
  Filled 2019-08-18: qty 250

## 2019-08-18 MED ORDER — CEFAZOLIN SODIUM-DEXTROSE 2-4 GM/100ML-% IV SOLN
2.0000 g | INTRAVENOUS | Status: AC
  Administered 2019-08-18: 2 g via INTRAVENOUS
  Filled 2019-08-18: qty 100

## 2019-08-18 MED ORDER — OXYCODONE HCL 5 MG PO TABS
5.0000 mg | ORAL_TABLET | ORAL | Status: DC | PRN
  Administered 2019-08-19: 5 mg via ORAL
  Administered 2019-08-19 – 2019-08-20 (×3): 10 mg via ORAL
  Filled 2019-08-18: qty 2
  Filled 2019-08-18: qty 1
  Filled 2019-08-18 (×2): qty 2

## 2019-08-18 MED ORDER — SODIUM CHLORIDE 0.9 % IV SOLN: INTRAVENOUS | Status: DC

## 2019-08-18 MED ORDER — LIDOCAINE 2% (20 MG/ML) 5 ML SYRINGE
INTRAMUSCULAR | Status: DC | PRN
  Administered 2019-08-18: 80 mg via INTRAVENOUS

## 2019-08-18 MED ORDER — SUCCINYLCHOLINE CHLORIDE 200 MG/10ML IV SOSY
PREFILLED_SYRINGE | INTRAVENOUS | Status: DC | PRN
  Administered 2019-08-18: 140 mg via INTRAVENOUS

## 2019-08-18 MED ORDER — ENOXAPARIN SODIUM 40 MG/0.4ML ~~LOC~~ SOLN
40.0000 mg | SUBCUTANEOUS | Status: DC
  Administered 2019-08-19 – 2019-08-20 (×2): 40 mg via SUBCUTANEOUS
  Filled 2019-08-18 (×2): qty 0.4

## 2019-08-18 MED ORDER — BUPIVACAINE HCL (PF) 0.25 % IJ SOLN
INTRAMUSCULAR | Status: DC | PRN
  Administered 2019-08-18: 30 mL

## 2019-08-18 MED ORDER — GABAPENTIN 300 MG PO CAPS
300.0000 mg | ORAL_CAPSULE | ORAL | Status: AC
  Administered 2019-08-18: 300 mg via ORAL
  Filled 2019-08-18: qty 1

## 2019-08-18 MED ORDER — PROPOFOL 10 MG/ML IV BOLUS
INTRAVENOUS | Status: DC | PRN
  Administered 2019-08-18: 150 mg via INTRAVENOUS

## 2019-08-18 MED ORDER — LIDOCAINE 2% (20 MG/ML) 5 ML SYRINGE
INTRAMUSCULAR | Status: DC | PRN
  Administered 2019-08-18: 1.5 mg/kg/h via INTRAVENOUS

## 2019-08-18 MED ORDER — DEXAMETHASONE SODIUM PHOSPHATE 10 MG/ML IJ SOLN
INTRAMUSCULAR | Status: AC
  Filled 2019-08-18: qty 1

## 2019-08-18 SURGICAL SUPPLY — 56 items
APL PRP STRL LF DISP 70% ISPRP (MISCELLANEOUS) ×1
APL SKNCLS STERI-STRIP NONHPOA (GAUZE/BANDAGES/DRESSINGS) ×1
APPLIER CLIP 5 13 M/L LIGAMAX5 (MISCELLANEOUS)
APPLIER CLIP ROT 10 11.4 M/L (STAPLE)
APR CLP MED LRG 11.4X10 (STAPLE)
APR CLP MED LRG 5 ANG JAW (MISCELLANEOUS)
BENZOIN TINCTURE PRP APPL 2/3 (GAUZE/BANDAGES/DRESSINGS) ×3 IMPLANT
BNDG ADH 1X3 SHEER STRL LF (GAUZE/BANDAGES/DRESSINGS) ×18 IMPLANT
BNDG ADH THN 3X1 STRL LF (GAUZE/BANDAGES/DRESSINGS) ×6
CHLORAPREP W/TINT 26 (MISCELLANEOUS) ×3 IMPLANT
CLIP APPLIE 5 13 M/L LIGAMAX5 (MISCELLANEOUS) IMPLANT
CLIP APPLIE ROT 10 11.4 M/L (STAPLE) IMPLANT
CLOSURE WOUND 1/2 X4 (GAUZE/BANDAGES/DRESSINGS) ×1
COVER SURGICAL LIGHT HANDLE (MISCELLANEOUS) ×3 IMPLANT
COVER WAND RF STERILE (DRAPES) IMPLANT
DECANTER SPIKE VIAL GLASS SM (MISCELLANEOUS) ×1 IMPLANT
DRAIN CHANNEL 19F RND (DRAIN) IMPLANT
DRAIN PENROSE 0.25X18 (DRAIN) ×2 IMPLANT
DRAIN PENROSE 0.5X18 (DRAIN) ×1 IMPLANT
ELECT L-HOOK LAP 45CM DISP (ELECTROSURGICAL)
ELECT REM PT RETURN 15FT ADLT (MISCELLANEOUS) ×3 IMPLANT
ELECTRODE L-HOOK LAP 45CM DISP (ELECTROSURGICAL) IMPLANT
EVACUATOR SILICONE 100CC (DRAIN) IMPLANT
GLOVE BIOGEL PI IND STRL 7.0 (GLOVE) ×1 IMPLANT
GLOVE BIOGEL PI INDICATOR 7.0 (GLOVE) ×2
GLOVE SURG SS PI 7.0 STRL IVOR (GLOVE) ×3 IMPLANT
GOWN STRL REUS W/TWL XL LVL3 (GOWN DISPOSABLE) ×9 IMPLANT
KIT BASIN (CUSTOM PROCEDURE TRAY) ×3 IMPLANT
KIT TURNOVER KIT A (KITS) IMPLANT
L-HOOK LAP DISP 36CM (ELECTROSURGICAL)
LEGGING LITHOTOMY PAIR STRL (DRAPES) ×3 IMPLANT
LHOOK LAP DISP 36CM (ELECTROSURGICAL) IMPLANT
MARKER SKIN DUAL TIP RULER LAB (MISCELLANEOUS) ×3 IMPLANT
PAD POSITIONING PINK XL (MISCELLANEOUS) IMPLANT
PENCIL SMOKE EVACUATOR (MISCELLANEOUS) IMPLANT
PROTECTOR NERVE ULNAR (MISCELLANEOUS) IMPLANT
SCISSORS LAP 5X45 EPIX DISP (ENDOMECHANICALS) ×3 IMPLANT
SET IRRIG TUBING LAPAROSCOPIC (IRRIGATION / IRRIGATOR) ×1 IMPLANT
SET TUBE SMOKE EVAC HIGH FLOW (TUBING) ×2 IMPLANT
SHEARS HARMONIC ACE PLUS 45CM (MISCELLANEOUS) ×3 IMPLANT
SLEEVE XCEL OPT CAN 5 100 (ENDOMECHANICALS) ×8 IMPLANT
STRIP CLOSURE SKIN 1/2X4 (GAUZE/BANDAGES/DRESSINGS) ×2 IMPLANT
SUT ETHIBOND 0 36 GRN (SUTURE) ×15 IMPLANT
SUT ETHILON 2 0 PS N (SUTURE) IMPLANT
SUT MNCRL AB 4-0 PS2 18 (SUTURE) ×3 IMPLANT
SUT SILK 0 SH 30 (SUTURE) IMPLANT
SUT SILK 2 0 SH (SUTURE) ×16 IMPLANT
TIP INNERVISION DETACH 40FR (MISCELLANEOUS) IMPLANT
TIP INNERVISION DETACH 50FR (MISCELLANEOUS) IMPLANT
TIP INNERVISION DETACH 56FR (MISCELLANEOUS) ×2 IMPLANT
TIPS INNERVISION DETACH 40FR (MISCELLANEOUS)
TOWEL OR 17X26 10 PK STRL BLUE (TOWEL DISPOSABLE) ×3 IMPLANT
TOWEL OR NON WOVEN STRL DISP B (DISPOSABLE) IMPLANT
TRAY LAPAROSCOPIC (CUSTOM PROCEDURE TRAY) ×3 IMPLANT
TROCAR BLADELESS OPT 5 100 (ENDOMECHANICALS) ×3 IMPLANT
TROCAR XCEL 12X100 BLDLESS (ENDOMECHANICALS) ×3 IMPLANT

## 2019-08-18 NOTE — Anesthesia Postprocedure Evaluation (Signed)
Anesthesia Post Note  Patient: Megan Booth  Procedure(s) Performed: LAPAROSCOPIC PARAESOPHAGEAL HERNIA REPAIR WITH FUNDOPLICATION (N/A Abdomen)     Patient location during evaluation: PACU Anesthesia Type: General Level of consciousness: sedated Pain management: pain level controlled Vital Signs Assessment: post-procedure vital signs reviewed and stable Respiratory status: spontaneous breathing and respiratory function stable Cardiovascular status: stable Postop Assessment: no apparent nausea or vomiting Anesthetic complications: no    Last Vitals:  Vitals:   08/18/19 1545 08/18/19 1600  BP: (!) 158/80 (!) 144/72  Pulse: 95 92  Resp: (!) 21 18  Temp:    SpO2: 95% 93%    Last Pain:  Vitals:   08/18/19 1545  TempSrc:   PainSc: 0-No pain                 Hampton Wixom DANIEL

## 2019-08-18 NOTE — Anesthesia Procedure Notes (Addendum)
Procedure Name: Intubation Date/Time: 08/18/2019 1:00 PM Performed by: Maxwell Caul, CRNA Pre-anesthesia Checklist: Patient identified, Emergency Drugs available, Suction available and Patient being monitored Patient Re-evaluated:Patient Re-evaluated prior to induction Oxygen Delivery Method: Circle system utilized Preoxygenation: Pre-oxygenation with 100% oxygen Induction Type: IV induction, Rapid sequence and Cricoid Pressure applied Laryngoscope Size: Mac and 4 Grade View: Grade I Tube type: Oral Tube size: 7.5 mm Number of attempts: 1 Airway Equipment and Method: Stylet Placement Confirmation: ETT inserted through vocal cords under direct vision,  positive ETCO2 and breath sounds checked- equal and bilateral Secured at: 21 cm Tube secured with: Tape Dental Injury: Teeth and Oropharynx as per pre-operative assessment  Comments: Upon DL with MAC 4, Grade 1 view and vocal cords were noted to be closed. Several seconds later, cords opened and ETT gently placed.

## 2019-08-18 NOTE — Op Note (Addendum)
Preoperative diagnosis: type III paraesophageal hernia Postoperative diagnosis: same   Procedure: laparoscopic paraesophageal hernia with Toupet fundoplication  Surgeon: Gurney Maxin, M.D.  Asst: Romana Juniper  Anesthesia: general  Indications for procedure: Megan Booth is a 74 y.o. year old female with symptoms of reflux and chest pain and work up showing large paraesophageal hernia with concern for gastric rotation within the hernia.  Description of procedure: Following informed consent, the patient was taken to the operating room and placed on the operating table in the supine position.  She had previously received prophylactic antibiotics and subcutaneous heparin for DVT prophylaxis in the pre-op holding area.  After induction of general endotracheal anesthesia by the anesthesiologist, the patient underwent placement of sequential compression devices and an oro-gastric tube.  A timeout was confirmed by the surgery and anesthesia teams.  The patient was adequately padded at all pressure points and placed on a footboard to prevent slippage from the OR table during extremes of position during surgery.  She underwent a routine sterile prep and drape of her entire abdomen.    Next, A transverse incision was made under the left subcostal area and a 74mm optical viewing trocar was introduced into the peritoneal cavity. Pneumoperitoneum was applied with a high flow and low pressure. A laparoscope was inserted to confirm placement. A extraperitoneal block was then placed at the lateral abdominal wall using exparel diluted with marcaine. 5 additional incisions were placed: 1 18mm trocar to the left of the midline. 1 additional 76mm trocar in the left lateral area, 1 83mm trocar in the right mid abdomen, 1 27mm trocar in the right subcostal area, and a Nathanson retractor was placed through a subxiphoid incision.  On evaluation of the abdomen, there was a large paraesophageal hernia. The hernia sac was  separated from the right crus and anterior diaphragm moving over to the left crus with harmonic scalpel. Next, the posterior crus area was inspected and hernia sac partially freed from the posterior aspect of the left and right crus.  Next, attention was turned to the short gastric vessels. The fundic short gastric vessels were divided with harmonic scalpel moving toward the diaphragm. This allowed complete mobilization of the left crus away from the hernia sac. Both crus were completely free at this time. The right pleura was visualized close to the intrathoracic esophagus. Blunt dissection was performed to create separation and then harmonic scalpel was used to divide filmy tissue. The anterior and posterior vagus nerves were identified. Additional blunt dissection was used to free the posterior and left esophagus from filmy attachments. This allowed > 3 cm of intraabdominal esophagus.  Next, the crus was re-approximated with 5 0 ethibond sutures in interrupted technique. In performing this a hole was created in the falciform from passing instruments. The falciform was fully divided to avoid any herniation issues. A 56 fr bougie was passed down to assess the size of the closure. The fundus was brought around the posterior esophagus and Toupet fundoplication was performed by suturing the posterior fundus to the right esophagus with 3 2-0 silks and the anterior fundus to the left fundus with 2 2-0 silks.  Findings: large type III paraesophageal hernia  Specimen: none  Implant: none   Blood loss: 20 ml  Local anesthesia: 50 ml Exparel:Marcaine Mix  Complications: none      Gurney Maxin, M.D. General, Bariatric, & Minimally Invasive Surgery [facility]

## 2019-08-18 NOTE — Plan of Care (Signed)
  Problem: Education: Goal: Knowledge of General Education information will improve Description Including pain rating scale, medication(s)/side effects and non-pharmacologic comfort measures Outcome: Progressing   

## 2019-08-18 NOTE — Anesthesia Preprocedure Evaluation (Addendum)
Anesthesia Evaluation  Patient identified by MRN, date of birth, ID band Patient awake    Reviewed: Allergy & Precautions, NPO status , Patient's Chart, lab work & pertinent test results  History of Anesthesia Complications Negative for: history of anesthetic complications  Airway Mallampati: II  TM Distance: >3 FB Neck ROM: Full    Dental no notable dental hx. (+) Dental Advisory Given   Pulmonary neg pulmonary ROS,    Pulmonary exam normal        Cardiovascular negative cardio ROS Normal cardiovascular exam     Neuro/Psych PSYCHIATRIC DISORDERS Anxiety negative neurological ROS     GI/Hepatic Neg liver ROS, GERD  ,  Endo/Other  Hypothyroidism   Renal/GU Renal disease     Musculoskeletal negative musculoskeletal ROS (+)   Abdominal   Peds  Hematology negative hematology ROS (+)   Anesthesia Other Findings Day of surgery medications reviewed with the patient.  Reproductive/Obstetrics                           Anesthesia Physical Anesthesia Plan  ASA: III  Anesthesia Plan: General   Post-op Pain Management:    Induction: Intravenous  PONV Risk Score and Plan: 3 and Dexamethasone  Airway Management Planned: Oral ETT  Additional Equipment:   Intra-op Plan:   Post-operative Plan: Extubation in OR  Informed Consent: I have reviewed the patients History and Physical, chart, labs and discussed the procedure including the risks, benefits and alternatives for the proposed anesthesia with the patient or authorized representative who has indicated his/her understanding and acceptance.     Dental advisory given  Plan Discussed with: Anesthesiologist and CRNA  Anesthesia Plan Comments:        Anesthesia Quick Evaluation

## 2019-08-18 NOTE — Transfer of Care (Signed)
Immediate Anesthesia Transfer of Care Note  Patient: Megan Booth  Procedure(s) Performed: Procedure(s): LAPAROSCOPIC PARAESOPHAGEAL HERNIA REPAIR WITH FUNDOPLICATION (N/A)  Patient Location: PACU  Anesthesia Type:General  Level of Consciousness: Alert, Awake, Oriented  Airway & Oxygen Therapy: Patient Spontanous Breathing  Post-op Assessment: Report given to RN  Post vital signs: Reviewed and stable  Last Vitals:  Vitals:   08/18/19 1121  BP: (!) 153/89  Pulse: 96  Resp: 18  Temp: 36.8 C  SpO2: 0000000    Complications: No apparent anesthesia complications

## 2019-08-18 NOTE — H&P (Signed)
Megan Booth is an 74 y.o. female.   Chief Complaint: hiatal hernia HPI: 74 yo female with long history of reflux who was found to have Barrett's esophagus. Her work up showed a large hiatal hernia and she presents for surgery.  Past Medical History:  Diagnosis Date  . Amnesia, global, transient 2015   see hospital notes  . Anxiety   . Barrett esophagus    Dr. Lajoyce Corners  . Cancer ([place])    left wrist skin cancer  . Diverticulosis   . GERD (gastroesophageal reflux disease)   . Hx of colonic polyps    Dr. Lajoyce Corners   . Hyperlipidemia   . Hypothyroidism   . Internal hemorrhoid   . Leukoplakia, vulva   . Sleep apnea   . Tendonitis 07/15/2019   Right foot posterior tibialis    Past Surgical History:  Procedure Laterality Date  . BREAST ENHANCEMENT SURGERY    . DILATION AND CURETTAGE OF UTERUS     x 2  . HYSTEROSCOPY    . LASIK     eye  . TUBAL LIGATION      Family History  Problem Relation Age of Onset  . Heart disease Mother        pacemaker  . Hypertension Mother   . Depression Mother   . Diabetes Mother   . Dementia Mother   . Cancer Father 60       lung, smoker  . Seizures Brother   . Colon cancer Neg Hx   . Esophageal cancer Neg Hx   . Stomach cancer Neg Hx    Social History:  reports that she has never smoked. She has never used smokeless tobacco. She reports current alcohol use of about 1.0 standard drinks of alcohol per week. She reports that she does not use drugs.  Allergies: No Known Allergies  Medications Prior to Admission  Medication Sig Dispense Refill  . acetaminophen (TYLENOL) 500 MG tablet Take 500-1,000 mg by mouth every 6 (six) hours as needed for mild pain or headache.     . Artificial Tear Ointment (DRY EYES OP) Place 1 drop into both eyes daily as needed (Dry eye).    Marland Kitchen atorvastatin (LIPITOR) 40 MG tablet TAKE 1 TABLET ONCE DAILY AT 6 PM. (Patient taking differently: Take 40 mg by mouth daily. ) 90 tablet 0  . Cholecalciferol (VITAMIN D3) 1000  units CAPS Take 1,000 Units by mouth daily.     . Estradiol-Estriol-Progesterone (BIEST/PROGESTERONE TD) Take 1 capsule by mouth daily.     . famotidine (PEPCID) 20 MG tablet Take 20 mg by mouth at bedtime.     . furosemide (LASIX) 20 MG tablet TAKE ONE TABLET DAILY AS NEEDED FOR FLUID OR EDEMA. (Patient taking differently: Take 20 mg by mouth daily as needed for fluid. ) 90 tablet 0  . ibuprofen (ADVIL) 200 MG tablet Take 200-800 mg by mouth every 6 (six) hours as needed for fever, headache or moderate pain.     Marland Kitchen levothyroxine (SYNTHROID) 25 MCG tablet TAKE 1 TABLET ONCE DAILY. (Patient taking differently: Take 25 mcg by mouth daily before breakfast. ) 90 tablet 0  . Melatonin 10 MG TABS Take 1 tablet by mouth daily as needed (sleep).     . nystatin-triamcinolone (MYCOLOG II) cream Apply 1 application topically daily as needed (female lesions).     Marland Kitchen omeprazole (PRILOSEC) 40 MG capsule Take 1 capsule (40 mg total) by mouth daily. 30 capsule 11  . Oxymetazoline HCl (NASAL SPRAY)  0.05 % SOLN Place 1 spray into the nose daily as needed (allergies and congestion).    Marland Kitchen PARoxetine (PAXIL) 10 MG tablet Take 10 mg by mouth daily.     Marland Kitchen zolpidem (AMBIEN) 10 MG tablet TAKE 1/2 TABLET AT BEDTIME AS NEEDED FOR SLEEP. (Patient taking differently: Take 10 mg by mouth at bedtime as needed for sleep. ) 15 tablet 1    No results found for this or any previous visit (from the past 48 hour(s)). No results found.  Review of Systems  Constitutional: Negative for chills and fever.  HENT: Negative for hearing loss.   Respiratory: Negative for cough.   Cardiovascular: Negative for chest pain and palpitations.  Gastrointestinal: Negative for abdominal pain, nausea and vomiting.  Genitourinary: Negative for dysuria and urgency.  Musculoskeletal: Negative for myalgias and neck pain.  Skin: Negative for rash.  Neurological: Negative for dizziness and headaches.  Hematological: Does not bruise/bleed easily.   Psychiatric/Behavioral: Negative for suicidal ideas.    Blood pressure (!) 153/89, pulse 96, temperature 98.3 F (36.8 C), temperature source Oral, resp. rate 18, height 5\' 3"  (1.6 m), weight 83 kg, SpO2 97 %. Physical Exam  Nursing note and vitals reviewed. Constitutional: She is oriented to person, place, and time. She appears well-developed and well-nourished.  HENT:  Head: Normocephalic and atraumatic.  Eyes: Conjunctivae and EOM are normal. No scleral icterus.  Cardiovascular: Normal rate and regular rhythm.  Respiratory: Effort normal and breath sounds normal. She has no wheezes. She has no rales. She exhibits no tenderness.  GI: Soft. She exhibits no distension. There is no abdominal tenderness. There is no rebound.  Musculoskeletal:        General: No edema. Normal range of motion.     Cervical back: Normal range of motion and neck supple.  Neurological: She is alert and oriented to person, place, and time.  Skin: Skin is warm and dry.  Psychiatric: She has a normal mood and affect. Her behavior is normal.     Assessment/Plan 74 yo female with large paraesophageal hernia -lap paraesophageal hernia repair with partial fundoplication -ERAs protocol -observation stay  Mickeal Skinner, MD 08/18/2019, 12:45 PM

## 2019-08-19 ENCOUNTER — Ambulatory Visit: Payer: Medicare Other | Admitting: Gastroenterology

## 2019-08-19 LAB — BASIC METABOLIC PANEL
Anion gap: 9 (ref 5–15)
BUN: 17 mg/dL (ref 8–23)
CO2: 24 mmol/L (ref 22–32)
Calcium: 8.5 mg/dL — ABNORMAL LOW (ref 8.9–10.3)
Chloride: 108 mmol/L (ref 98–111)
Creatinine, Ser: 0.99 mg/dL (ref 0.44–1.00)
GFR calc Af Amer: >60 mL/min (ref >60)
GFR calc non Af Amer: 57 mL/min — ABNORMAL LOW (ref >60)
Glucose, Bld: 139 mg/dL — ABNORMAL HIGH (ref 70–99)
Potassium: 4.3 mmol/L (ref 3.5–5.1)
Sodium: 141 mmol/L (ref 135–145)

## 2019-08-19 LAB — CBC
HCT: 33.7 % — ABNORMAL LOW (ref 36.0–46.0)
Hemoglobin: 10.8 g/dL — ABNORMAL LOW (ref 12.0–15.0)
MCH: 30.9 pg (ref 26.0–34.0)
MCHC: 32 g/dL (ref 30.0–36.0)
MCV: 96.3 fL (ref 80.0–100.0)
Platelets: 243 10*3/uL (ref 150–400)
RBC: 3.5 MIL/uL — ABNORMAL LOW (ref 3.87–5.11)
RDW: 13 % (ref 11.5–15.5)
WBC: 11.7 10*3/uL — ABNORMAL HIGH (ref 4.0–10.5)
nRBC: 0 % (ref 0.0–0.2)

## 2019-08-19 MED ORDER — MENTHOL 3 MG MT LOZG
1.0000 | LOZENGE | OROMUCOSAL | Status: DC | PRN
  Filled 2019-08-19: qty 9

## 2019-08-19 NOTE — Plan of Care (Signed)

## 2019-08-19 NOTE — Progress Notes (Signed)
Progress Note: General Surgery Service   Chief Complaint/Subjective: Some sore throat, neck and back pain and left abdominal pain  Objective: Vital signs in last 24 hours: Temp:  [97.4 F (36.3 C)-98.4 F (36.9 C)] 97.4 F (36.3 C) (04/28 1025) Pulse Rate:  [72-96] 79 (04/28 1025) Resp:  [11-21] 16 (04/28 1025) BP: (105-158)/(68-89) 128/68 (04/28 1025) SpO2:  [93 %-99 %] 99 % (04/28 1025) Weight:  [83 kg] 83 kg (04/27 1125) Last BM Date: 08/17/19  Intake/Output from previous day: 04/27 0701 - 04/28 0700 In: 2212 [P.O.:180; I.V.:1932; IV Piggyback:100] Out: 1565 [Urine:1550; Blood:15] Intake/Output this shift: Total I/O In: 299.6 [I.V.:299.6] Out: -   Gen: NAD  Resp: nonlabored  Card: RRR  Abd: soft, ATTP, wounds c/d/i  Lab Results: CBC  Recent Labs    08/18/19 1715 08/19/19 0417  WBC 14.2* 11.7*  HGB 12.5 10.8*  HCT 38.7 33.7*  PLT 249 243   BMET Recent Labs    08/18/19 1715 08/19/19 0417  NA  --  141  K  --  4.3  CL  --  108  CO2  --  24  GLUCOSE  --  139*  BUN  --  17  CREATININE 1.25* 0.99  CALCIUM  --  8.5*   PT/INR No results for input(s): LABPROT, INR in the last 72 hours. ABG No results for input(s): PHART, HCO3 in the last 72 hours.  Invalid input(s): PCO2, PO2  Anti-infectives: Anti-infectives (From admission, onward)   Start     Dose/Rate Route Frequency Ordered Stop   08/18/19 1115  ceFAZolin (ANCEF) IVPB 2g/100 mL premix     2 g 200 mL/hr over 30 Minutes Intravenous On call to O.R. 08/18/19 1113 08/18/19 1317      Medications: Scheduled Meds: . acetaminophen  1,000 mg Oral Q6H  . enoxaparin (LOVENOX) injection  40 mg Subcutaneous Q24H  . feeding supplement  237 mL Oral BID BM  . levothyroxine  25 mcg Oral Daily  . pantoprazole (PROTONIX) IV  40 mg Intravenous QHS  . PARoxetine  10 mg Oral Daily   Continuous Infusions: PRN Meds:.diphenhydrAMINE **OR** diphenhydrAMINE, ketorolac, melatonin, menthol-cetylpyridinium,  metoprolol tartrate, morphine injection, ondansetron **OR** ondansetron (ZOFRAN) IV, oxyCODONE, simethicone, zolpidem  Assessment/Plan: s/p Procedure(s): LAPAROSCOPIC PARAESOPHAGEAL HERNIA REPAIR WITH FUNDOPLICATION 0000000 -advance to full liquids -saline lock fluids -pain control -lozenge for sore throat -ambulate in the hall -CBC in am -plan for discharge tomorrow   LOS: 1 day   Mickeal Skinner, MD [facility]

## 2019-08-20 ENCOUNTER — Encounter: Payer: Self-pay | Admitting: Family Medicine

## 2019-08-20 ENCOUNTER — Ambulatory Visit: Payer: Medicare Other | Admitting: Family Medicine

## 2019-08-20 LAB — CBC
HCT: 34.3 % — ABNORMAL LOW (ref 36.0–46.0)
Hemoglobin: 10.8 g/dL — ABNORMAL LOW (ref 12.0–15.0)
MCH: 31.3 pg (ref 26.0–34.0)
MCHC: 31.5 g/dL (ref 30.0–36.0)
MCV: 99.4 fL (ref 80.0–100.0)
Platelets: 221 10*3/uL (ref 150–400)
RBC: 3.45 MIL/uL — ABNORMAL LOW (ref 3.87–5.11)
RDW: 13.4 % (ref 11.5–15.5)
WBC: 8.2 10*3/uL (ref 4.0–10.5)
nRBC: 0 % (ref 0.0–0.2)

## 2019-08-20 MED ORDER — IBUPROFEN 800 MG PO TABS
800.0000 mg | ORAL_TABLET | Freq: Three times a day (TID) | ORAL | 0 refills | Status: DC | PRN
Start: 2019-08-20 — End: 2019-12-15

## 2019-08-20 MED ORDER — OXYCODONE HCL 5 MG PO TABS
5.0000 mg | ORAL_TABLET | Freq: Four times a day (QID) | ORAL | 0 refills | Status: DC | PRN
Start: 2019-08-20 — End: 2019-08-24

## 2019-08-20 MED ORDER — ONDANSETRON HCL 4 MG/2ML IJ SOLN: 4.0000 mg | Freq: Four times a day (QID) | INTRAMUSCULAR | 0 refills | Status: DC | PRN

## 2019-08-20 NOTE — Progress Notes (Signed)
2 small tiny specks of blood noted in urine this am

## 2019-08-20 NOTE — Discharge Summary (Signed)
Physician Discharge Summary  Megan Booth [id number] DOB: [birth year] DOA: 08/18/2019  PCP: Marin Olp, MD  Admit date: 08/18/2019 Discharge date: 08/20/2019  Recommendations for Outpatient Follow-up:  1.  (include homehealth, outpatient follow-up instructions, specific recommendations for PCP to follow-up on, etc.)   Discharge Diagnoses:  Active Problems:   Hiatal hernia   Surgical Procedure: lap paraesophageal hernia repair with fundoplication  Discharge Condition: Good Disposition: Home  Diet recommendation: full liquid   Hospital Course:  74 yo female presented for hernia repair. Post op she was admitted to the surgical floor. She was slowly advanced on diet and tolerating full liquids well. She had some sore throat and epigastric pain post op that improved from POD 1 to POD 2. She was discharged home POD 2.  Discharge Instructions  Discharge Instructions    Call MD for:  difficulty breathing, headache or visual disturbances   Complete by: As directed    Call MD for:  persistant nausea and vomiting   Complete by: As directed    Call MD for:  redness, tenderness, or signs of infection (pain, swelling, redness, odor or green/yellow discharge around incision site)   Complete by: As directed    Call MD for:  severe uncontrolled pain   Complete by: As directed    Call MD for:  temperature >100.4   Complete by: As directed    Diet full liquid   Complete by: As directed    Increase activity slowly   Complete by: As directed      Allergies as of 08/20/2019   No Known Allergies     Medication List    STOP taking these medications   famotidine 20 MG tablet Commonly known as: PEPCID     TAKE these medications   acetaminophen 500 MG tablet Commonly known as: TYLENOL Take 500-1,000 mg by mouth every 6 (six) hours as needed for mild pain or headache.   atorvastatin 40 MG tablet Commonly known as: LIPITOR TAKE 1 TABLET ONCE DAILY AT 6 PM. What changed:  See the new instructions.   BIEST/PROGESTERONE TD Take 1 capsule by mouth daily.   DRY EYES OP Place 1 drop into both eyes daily as needed (Dry eye).   furosemide 20 MG tablet Commonly known as: LASIX TAKE ONE TABLET DAILY AS NEEDED FOR FLUID OR EDEMA. What changed: See the new instructions.   ibuprofen 800 MG tablet Commonly known as: ADVIL Take 1 tablet (800 mg total) by mouth every 8 (eight) hours as needed. What changed:   medication strength  how much to take  when to take this  reasons to take this   levothyroxine 25 MCG tablet Commonly known as: SYNTHROID TAKE 1 TABLET ONCE DAILY. What changed: when to take this   Melatonin 10 MG Tabs Take 1 tablet by mouth daily as needed (sleep).   Nasal Spray 0.05 % Soln Place 1 spray into the nose daily as needed (allergies and congestion).   nystatin-triamcinolone cream Commonly known as: MYCOLOG II Apply 1 application topically daily as needed (female lesions).   omeprazole 40 MG capsule Commonly known as: PRILOSEC Take 1 capsule (40 mg total) by mouth daily.   ondansetron 4 MG/2ML Soln injection Commonly known as: ZOFRAN Inject 2 mLs (4 mg total) into the vein every 6 (six) hours as needed for nausea.   oxyCODONE 5 MG immediate release tablet Commonly known as: Oxy IR/ROXICODONE Take 1 tablet (5 mg total) by mouth every 6 (six) hours as needed for  severe pain.   PARoxetine 10 MG tablet Commonly known as: PAXIL Take 10 mg by mouth daily.   Vitamin D3 25 MCG (1000 UT) Caps Take 1,000 Units by mouth daily.   zolpidem 10 MG tablet Commonly known as: AMBIEN TAKE 1/2 TABLET AT BEDTIME AS NEEDED FOR SLEEP. What changed: See the new instructions.         The results of significant diagnostics from this hospitalization (including imaging, microbiology, ancillary and laboratory) are listed below for reference.    Significant Diagnostic Studies: No results found.  Labs: Basic Metabolic Panel: Recent Labs   Lab 08/18/19 1715 08/19/19 0417  NA  --  141  K  --  4.3  CL  --  108  CO2  --  24  GLUCOSE  --  139*  BUN  --  17  CREATININE 1.25* 0.99  CALCIUM  --  8.5*   Liver Function Tests: No results for input(s): AST, ALT, ALKPHOS, BILITOT, PROT, ALBUMIN in the last 168 hours.  CBC: Recent Labs  Lab 08/14/19 1429 08/18/19 1715 08/19/19 0417 08/20/19 0439  WBC 8.5 14.2* 11.7* 8.2  HGB 12.8 12.5 10.8* 10.8*  HCT 39.7 38.7 33.7* 34.3*  MCV 96.8 97.0 96.3 99.4  PLT 282 249 243 221    CBG: No results for input(s): GLUCAP in the last 168 hours.  Active Problems:   Hiatal hernia   Time coordinating discharge: 15  min

## 2019-08-24 ENCOUNTER — Encounter: Payer: Self-pay | Admitting: Family Medicine

## 2019-08-24 ENCOUNTER — Other Ambulatory Visit: Payer: Self-pay

## 2019-08-24 ENCOUNTER — Ambulatory Visit (INDEPENDENT_AMBULATORY_CARE_PROVIDER_SITE_OTHER): Payer: Medicare Other | Admitting: Family Medicine

## 2019-08-24 VITALS — BP 128/82 | HR 97 | Temp 98.0°F | Ht 63.0 in | Wt 184.2 lb

## 2019-08-24 DIAGNOSIS — D649 Anemia, unspecified: Secondary | ICD-10-CM

## 2019-08-24 DIAGNOSIS — N183 Chronic kidney disease, stage 3 unspecified: Secondary | ICD-10-CM | POA: Diagnosis not present

## 2019-08-24 DIAGNOSIS — Z79899 Other long term (current) drug therapy: Secondary | ICD-10-CM

## 2019-08-24 DIAGNOSIS — E039 Hypothyroidism, unspecified: Secondary | ICD-10-CM

## 2019-08-24 DIAGNOSIS — G4700 Insomnia, unspecified: Secondary | ICD-10-CM

## 2019-08-24 DIAGNOSIS — F419 Anxiety disorder, unspecified: Secondary | ICD-10-CM

## 2019-08-24 DIAGNOSIS — E538 Deficiency of other specified B group vitamins: Secondary | ICD-10-CM | POA: Diagnosis not present

## 2019-08-24 DIAGNOSIS — E785 Hyperlipidemia, unspecified: Secondary | ICD-10-CM

## 2019-08-24 DIAGNOSIS — K449 Diaphragmatic hernia without obstruction or gangrene: Secondary | ICD-10-CM | POA: Diagnosis not present

## 2019-08-24 MED ORDER — ZOLPIDEM TARTRATE 10 MG PO TABS: 5.0000 mg | ORAL_TABLET | Freq: Every evening | ORAL | 3 refills | Status: DC | PRN

## 2019-08-24 NOTE — Patient Instructions (Addendum)
We have called in refill on medications for you. We will work on the authorization for you and if any problems let our office know.   We will need for you to come in next month for fasting labs.   Avoid nsaids long term due to kidneys. You can use tylenol arthritis and voltaren gel as needed.   Let our office know who is giving you the Paxil. You can send [place] a message on my chart.

## 2019-08-24 NOTE — Progress Notes (Signed)
Phone [phone] In person visit   Subjective:   Megan Booth is a 74 y.o. year old very pleasant female patient who presents for/with See problem oriented charting Chief Complaint  Patient presents with  . Follow-up   This visit occurred during the SARS-CoV-2 public health emergency.  Safety protocols were in place, including screening questions prior to the visit, additional usage of staff PPE, and extensive cleaning of exam room while observing appropriate contact time as indicated for disinfecting solutions.   Past Medical History-  Patient Active Problem List   Diagnosis Date Noted  . Amnesia, global, transient     Priority: High  . CKD (chronic kidney disease), stage III 08/25/2018    Priority: Medium  . Osteopenia 04/15/2015    Priority: Medium  . Hormone replacement therapy 08/12/2014    Priority: Medium  . Insomnia 08/12/2014    Priority: Medium  . Hyperlipidemia     Priority: Medium  . Hypothyroidism     Priority: Medium  . Sleep apnea     Priority: Medium  . Anxiety     Priority: Medium  . Tendonitis 07/15/2019    Priority: Low  . Edema 08/12/2014    Priority: Low  . GERD (gastroesophageal reflux disease)     Priority: Low  . Hx of colonic polyps     Priority: Low  . Leukoplakia, vulva     Priority: Low  . B12 deficiency 08/03/2011    Priority: Low    Medications- reviewed and updated Current Outpatient Medications  Medication Sig Dispense Refill  . acetaminophen (TYLENOL) 500 MG tablet Take 500-1,000 mg by mouth every 6 (six) hours as needed for mild pain or headache.     . Artificial Tear Ointment (DRY EYES OP) Place 1 drop into both eyes daily as needed (Dry eye).    Marland Kitchen atorvastatin (LIPITOR) 40 MG tablet TAKE 1 TABLET ONCE DAILY AT 6 PM. (Patient taking differently: Take 40 mg by mouth daily. ) 90 tablet 0  . Cholecalciferol (VITAMIN D3) 1000 units CAPS Take 1,000 Units by mouth daily.     . Estradiol-Estriol-Progesterone (BIEST/PROGESTERONE  TD) Take 1 capsule by mouth daily.     . furosemide (LASIX) 20 MG tablet TAKE ONE TABLET DAILY AS NEEDED FOR FLUID OR EDEMA. (Patient taking differently: Take 20 mg by mouth daily as needed for fluid. ) 90 tablet 0  . ibuprofen (ADVIL) 800 MG tablet Take 1 tablet (800 mg total) by mouth every 8 (eight) hours as needed. 30 tablet 0  . levothyroxine (SYNTHROID) 25 MCG tablet TAKE 1 TABLET ONCE DAILY. (Patient taking differently: Take 25 mcg by mouth daily before breakfast. ) 90 tablet 0  . Melatonin 10 MG TABS Take 1 tablet by mouth daily as needed (sleep).     . nystatin-triamcinolone (MYCOLOG II) cream Apply 1 application topically daily as needed (female lesions).     Marland Kitchen omeprazole (PRILOSEC) 40 MG capsule Take 1 capsule (40 mg total) by mouth daily. 30 capsule 11  . ondansetron (ZOFRAN) 4 MG/2ML SOLN injection Inject 2 mLs (4 mg total) into the vein every 6 (six) hours as needed for nausea. 2 mL 0  . Oxymetazoline HCl (NASAL SPRAY) 0.05 % SOLN Place 1 spray into the nose daily as needed (allergies and congestion).    Marland Kitchen PARoxetine (PAXIL) 10 MG tablet Take 10 mg by mouth daily.     Marland Kitchen zolpidem (AMBIEN) 10 MG tablet Take 0.5-1 tablets (5-10 mg total) by mouth at bedtime as needed  for sleep. 45 tablet 3   No current facility-administered medications for this visit.     Objective:  BP 128/82   Pulse 97   Temp 98 F (36.7 C) (Temporal)   Ht 5\' 3"  (1.6 m)   Wt 184 lb 3.2 oz (83.6 kg)   SpO2 96%   BMI 32.63 kg/m  Gen: NAD, resting comfortably CV: RRR no murmurs rubs or gallops Lungs: CTAB no crackles, wheeze, rhonchi Ext: no edema Skin: warm, dry     Assessment and Plan   # Insomnia  S: Patient in for refill on Ambien. Feels like medications work well. She sleeps well with 6 hours sleep on ambien 5 mg-she has tried a 5 mg tablet in the past but that was not as effective as half of the 10 mg tablet. A/P: Reasonable control-continue current medication.  Refill provided today. She may need  prior auth.   #hyperlipidemia S: Medication: atorvastatin 40mg  Lab Results  Component Value Date   CHOL 217 (H) 08/25/2018   HDL 48.60 08/25/2018   LDLCALC 101 (H) 09/15/2013   LDLDIRECT 106.0 08/25/2018   TRIG 431.0 (H) 08/25/2018   CHOLHDL 4 08/25/2018   A/P: hopefully slightly improved-last year LDL was above 100 and triglycerides were close to 450-we will update lipid panel in about a month  #hypothyroidism S: compliant On thyroid medication- levothyroxine 25 mcg  Lab Results  Component Value Date   TSH 4.03 08/25/2018   A/P: Hopefully stable-update TSH level when she comes back for labs  # GERD/recent hiatal hernia surgery S: compliant with prilosec 40mg . Had been on nexium OTC and pepcid before hiatal hernia surgery   on clear liquids until middle of may with addition of oatmeal  Lab Results  Component Value Date   VITAMINB12 284 12/04/2017  A/P: Reflux is well controlled on Prilosec 40 mg.  She is off Nexium and Pepcid.  This is being managed by gastroenterology.  With long-term PPI use-we will get B12 level with labs  # CKD III S: Recent GFR just below 60.  Patient is on ibuprofen 800 mg post surgery she also tells me she intermittently uses ibuprofen for aches and pains and Tylenol is not effective. A/P: We are going to get labs sometime in the next month to make sure ibuprofen is not creating too much strain-I told patient I would like for her to get off this as soon as possible and also not to use over-the-counter ibuprofen.  Recommended she try Tylenol arthritis and Voltaren gel instead -She does occasionally get swelling and Lasix is helpful-fortunately only taking that about once every 6 weeks  #Osteopenia-appears to be an erroneous entry for bone density from 2021-patient believes her last was in 2018.  At that time bone density had actually improved slightly.  Discussed updating this at this time but she would like to hold off until 58-month  follow-up  #Anxiety-patient on Paxil.  She states she used to be on a different what sounds like SSRI but it was recently changed-unclear who changed this or who started medication-patient will check on her bottle and let us know  Recommended follow up: Return in about 6 months (around 02/24/2020). Future Appointments  Date Time Provider [place]  09/22/2019  8:30 AM [facility]  02/25/2020  1:20 PM Marin Olp, MD [facility]   Lab/Order associations:   ICD-10-CM   1. Insomnia, unspecified type  G47.00   2. Hyperlipidemia, unspecified hyperlipidemia type  E78.5 CBC with  Differential/Platelet    Comprehensive metabolic panel    Lipid panel  3. Hypothyroidism, unspecified type  E03.9 TSH  4. High risk medication use  Z79.899 B12  5. Hiatal hernia  K44.9   6. Anemia, unspecified type  D64.9 Type and screen    Meds ordered this encounter  Medications  . zolpidem (AMBIEN) 10 MG tablet    Sig: Take 0.5-1 tablets (5-10 mg total) by mouth at bedtime as needed for sleep.    Dispense:  45 tablet    Refill:  3   Return precautions advised.  Garret Reddish, MD

## 2019-08-26 ENCOUNTER — Encounter: Payer: Self-pay | Admitting: Family Medicine

## 2019-09-15 ENCOUNTER — Other Ambulatory Visit: Payer: Self-pay | Admitting: Family Medicine

## 2019-09-18 DIAGNOSIS — L859 Epidermal thickening, unspecified: Secondary | ICD-10-CM | POA: Diagnosis not present

## 2019-09-18 DIAGNOSIS — L814 Other melanin hyperpigmentation: Secondary | ICD-10-CM | POA: Diagnosis not present

## 2019-09-18 DIAGNOSIS — D485 Neoplasm of uncertain behavior of skin: Secondary | ICD-10-CM | POA: Diagnosis not present

## 2019-09-18 DIAGNOSIS — L570 Actinic keratosis: Secondary | ICD-10-CM | POA: Diagnosis not present

## 2019-09-18 DIAGNOSIS — Z85828 Personal history of other malignant neoplasm of skin: Secondary | ICD-10-CM | POA: Diagnosis not present

## 2019-09-22 ENCOUNTER — Other Ambulatory Visit: Payer: Medicare Other

## 2019-09-22 ENCOUNTER — Other Ambulatory Visit: Payer: Self-pay

## 2019-09-22 ENCOUNTER — Other Ambulatory Visit (INDEPENDENT_AMBULATORY_CARE_PROVIDER_SITE_OTHER): Payer: Medicare Other

## 2019-09-22 DIAGNOSIS — E538 Deficiency of other specified B group vitamins: Secondary | ICD-10-CM | POA: Diagnosis not present

## 2019-09-22 DIAGNOSIS — E039 Hypothyroidism, unspecified: Secondary | ICD-10-CM

## 2019-09-22 DIAGNOSIS — E785 Hyperlipidemia, unspecified: Secondary | ICD-10-CM

## 2019-09-22 DIAGNOSIS — Z79899 Other long term (current) drug therapy: Secondary | ICD-10-CM

## 2019-09-22 LAB — COMPREHENSIVE METABOLIC PANEL
ALT: 13 U/L (ref 0–35)
AST: 13 U/L (ref 0–37)
Albumin: 4 g/dL (ref 3.5–5.2)
Alkaline Phosphatase: 79 U/L (ref 39–117)
BUN: 18 mg/dL (ref 6–23)
CO2: 22 mEq/L (ref 19–32)
Calcium: 9.3 mg/dL (ref 8.4–10.5)
Chloride: 107 mEq/L (ref 96–112)
Creatinine, Ser: 0.92 mg/dL (ref 0.40–1.20)
GFR: 60.02 mL/min (ref >60.00)
Glucose, Bld: 114 mg/dL — ABNORMAL HIGH (ref 70–99)
Potassium: 4.1 mEq/L (ref 3.5–5.1)
Sodium: 139 mEq/L (ref 135–145)
Total Bilirubin: 0.3 mg/dL (ref 0.2–1.2)
Total Protein: 6.3 g/dL (ref 6.0–8.3)

## 2019-09-22 LAB — CBC WITH DIFFERENTIAL/PLATELET
Basophils Absolute: 0.1 10*3/uL (ref 0.0–0.1)
Basophils Relative: 0.9 % (ref 0.0–3.0)
Eosinophils Absolute: 0.7 10*3/uL (ref 0.0–0.7)
Eosinophils Relative: 9.5 % — ABNORMAL HIGH (ref 0.0–5.0)
HCT: 36.1 % (ref 36.0–46.0)
Hemoglobin: 12.2 g/dL (ref 12.0–15.0)
Lymphocytes Relative: 35.5 % (ref 12.0–46.0)
Lymphs Abs: 2.5 10*3/uL (ref 0.7–4.0)
MCHC: 33.8 g/dL (ref 30.0–36.0)
MCV: 92 fl (ref 78.0–100.0)
Monocytes Absolute: 0.5 10*3/uL (ref 0.1–1.0)
Monocytes Relative: 6.9 % (ref 3.0–12.0)
Neutro Abs: 3.4 10*3/uL (ref 1.4–7.7)
Neutrophils Relative %: 47.2 % (ref 43.0–77.0)
Platelets: 299 10*3/uL (ref 150.0–400.0)
RBC: 3.93 Mil/uL (ref 3.87–5.11)
RDW: 13.4 % (ref 11.5–15.5)
WBC: 7.1 10*3/uL (ref 4.0–10.5)

## 2019-09-22 LAB — TSH: TSH: 2.02 u[IU]/mL (ref 0.35–4.50)

## 2019-09-22 LAB — LIPID PANEL
Cholesterol: 222 mg/dL — ABNORMAL HIGH (ref 0–200)
HDL: 41 mg/dL (ref >39.00)
NonHDL: 181.15
Total CHOL/HDL Ratio: 5
Triglycerides: 364 mg/dL — ABNORMAL HIGH (ref 0.0–149.0)
VLDL: 72.8 mg/dL — ABNORMAL HIGH (ref 0.0–40.0)

## 2019-09-22 LAB — LDL CHOLESTEROL, DIRECT: Direct LDL: 117 mg/dL

## 2019-09-22 LAB — VITAMIN B12: Vitamin B-12: 242 pg/mL (ref 211–911)

## 2019-09-24 DIAGNOSIS — M76821 Posterior tibial tendinitis, right leg: Secondary | ICD-10-CM | POA: Diagnosis not present

## 2019-10-05 DIAGNOSIS — Z01419 Encounter for gynecological examination (general) (routine) without abnormal findings: Secondary | ICD-10-CM | POA: Diagnosis not present

## 2019-10-05 DIAGNOSIS — Z6833 Body mass index (BMI) 33.0-33.9, adult: Secondary | ICD-10-CM | POA: Diagnosis not present

## 2019-10-05 DIAGNOSIS — N951 Menopausal and female climacteric states: Secondary | ICD-10-CM | POA: Diagnosis not present

## 2019-10-09 ENCOUNTER — Other Ambulatory Visit: Payer: Self-pay | Admitting: Family Medicine

## 2019-12-10 ENCOUNTER — Telehealth: Payer: Self-pay

## 2019-12-10 NOTE — Telephone Encounter (Signed)
Pt is needing prior auth for [employer]

## 2019-12-15 ENCOUNTER — Encounter: Payer: Self-pay | Admitting: Physician Assistant

## 2019-12-15 ENCOUNTER — Ambulatory Visit (INDEPENDENT_AMBULATORY_CARE_PROVIDER_SITE_OTHER): Payer: Medicare Other | Admitting: Physician Assistant

## 2019-12-15 ENCOUNTER — Other Ambulatory Visit: Payer: Self-pay

## 2019-12-15 VITALS — BP 120/78 | HR 81 | Temp 97.3°F | Ht 63.0 in | Wt 189.2 lb

## 2019-12-15 DIAGNOSIS — R0789 Other chest pain: Secondary | ICD-10-CM

## 2019-12-15 MED ORDER — PANTOPRAZOLE SODIUM 40 MG PO TBEC
40.0000 mg | DELAYED_RELEASE_TABLET | Freq: Every day | ORAL | 1 refills | Status: DC
Start: 2019-12-15 — End: 2020-06-07

## 2019-12-15 NOTE — Progress Notes (Signed)
Megan Booth is a 74 y.o. female here for a new problem.  I acted as a Education administrator for [employer], PA-C [employer], LPN   History of Present Illness:   Chief Complaint  Patient presents with   c/o sternum pain    ran into a bookcase    HPI   Sternum pain Pt c/o sternum pain ran into a book case Memorial Day weekend. Pt was not seen immediately after this. Pt c/o pain everyday like indigestion and dull muscle pain. Pt is using Tylenol and Pepcid at night with relief. She is taking Pepcid in addition to her Prilosec.  She is concerned because she is s/p lap paraesophageal hernia repair with fundoplication in April 0355 and is worried that she may have had some trauma to this with running into the bookcase. Denies chest pain, SOB, radiation of pain, pain with swallowing or obvious swallowing changes.  Past Medical History:  Diagnosis Date   Amnesia, global, transient 2015   see hospital notes   Anxiety    Barrett esophagus    Dr. Lajoyce Booth   Cancer [facility])    left wrist skin cancer   Diverticulosis    GERD (gastroesophageal reflux disease)    Hx of colonic polyps    Dr. Lajoyce Booth    Hyperlipidemia    Hypothyroidism    Internal hemorrhoid    Leukoplakia, vulva    Sleep apnea    Tendonitis 07/15/2019   Right foot posterior tibialis     Social History   Tobacco Use   Smoking status: Never Smoker   Smokeless tobacco: Never Used  Vaping Use   Vaping Use: Never used  Substance Use Topics   Alcohol use: Yes    Alcohol/week: 1.0 standard drink    Types: 1 Glasses of wine per week    Comment: rarely   Drug use: No    Past Surgical History:  Procedure Laterality Date   BREAST ENHANCEMENT SURGERY     DILATION AND CURETTAGE OF UTERUS     x 2   HIATAL HERNIA REPAIR N/A 08/18/2019   Procedure: LAPAROSCOPIC PARAESOPHAGEAL HERNIA REPAIR WITH FUNDOPLICATION;  Surgeon: Megan Booth, Megan Bruce, MD;  Location: [facility];  Service: General;  Laterality: N/A;    HYSTEROSCOPY     LASIK     eye   TUBAL LIGATION      Family History  Problem Relation Age of Onset   Heart disease Mother        pacemaker   Hypertension Mother    Depression Mother    Diabetes Mother    Dementia Mother    Cancer Father 65       lung, smoker   Seizures Brother    Colon cancer Neg Hx    Esophageal cancer Neg Hx    Stomach cancer Neg Hx     No Known Allergies  Current Medications:   Current Outpatient Medications:    acetaminophen (TYLENOL) 500 MG tablet, Take 500-1,000 mg by mouth every 6 (six) hours as needed for mild pain or headache. , Disp: , Rfl:    Artificial Tear Ointment (DRY EYES OP), Place 1 drop into both eyes daily as needed (Dry eye)., Disp: , Rfl:    atorvastatin (LIPITOR) 40 MG tablet, TAKE 1 TABLET ONCE DAILY AT 6 PM., Disp: 90 tablet, Rfl: 3   Cholecalciferol (VITAMIN D3) 1000 units CAPS, Take 1,000 Units by mouth daily. , Disp: , Rfl:    Estradiol-Estriol-Progesterone (BIEST/PROGESTERONE TD), Take 1 capsule by  mouth daily. , Disp: , Rfl:    furosemide (LASIX) 20 MG tablet, TAKE ONE TABLET DAILY AS NEEDED FOR FLUID OR EDEMA. (Patient taking differently: Take 20 mg by mouth daily as needed for fluid. ), Disp: 90 tablet, Rfl: 0   levothyroxine (SYNTHROID) 25 MCG tablet, TAKE 1 TABLET ONCE DAILY., Disp: 90 tablet, Rfl: 0   Melatonin 10 MG TABS, Take 1 tablet by mouth daily as needed (sleep). , Disp: , Rfl:    nystatin-triamcinolone (MYCOLOG II) cream, Apply 1 application topically daily as needed (female lesions). , Disp: , Rfl:    omeprazole (PRILOSEC) 40 MG capsule, Take 1 capsule (40 mg total) by mouth daily., Disp: 30 capsule, Rfl: 11   Oxymetazoline HCl (NASAL SPRAY) 0.05 % SOLN, Place 1 spray into the nose daily as needed (allergies and congestion)., Disp: , Rfl:    PARoxetine (PAXIL) 10 MG tablet, Take 10 mg by mouth daily. , Disp: , Rfl:    zolpidem (AMBIEN) 10 MG tablet, Take 0.5-1 tablets (5-10 mg total) by mouth  at bedtime as needed for sleep., Disp: 45 tablet, Rfl: 3   pantoprazole (PROTONIX) 40 MG tablet, Take 1 tablet (40 mg total) by mouth daily., Disp: 30 tablet, Rfl: 1   Review of Systems:   ROS  Negative unless otherwise specified per HPI.  Vitals:   Vitals:   12/15/19 1014  BP: 120/78  Pulse: 81  Temp: (!) 97.3 F (36.3 C)  TempSrc: Temporal  SpO2: 97%  Weight: 189 lb 4 oz (85.8 kg)  Height: 5\' 3"  (1.6 m)     Body mass index is 33.52 kg/m.  Physical Exam:   Physical Exam Vitals and nursing note reviewed.  Constitutional:      General: She is not in acute distress.    Appearance: She is well-developed. She is not ill-appearing or toxic-appearing.  Cardiovascular:     Rate and Rhythm: Normal rate and regular rhythm.     Pulses: Normal pulses.     Heart sounds: Normal heart sounds, S1 normal and S2 normal.     Comments: No LE edema Pulmonary:     Effort: Pulmonary effort is normal.     Breath sounds: Normal breath sounds.  Chest:    Skin:    General: Skin is warm and dry.  Neurological:     Mental Status: She is alert.     GCS: GCS eye subscore is 4. GCS verbal subscore is 5. GCS motor subscore is 6.  Psychiatric:        Speech: Speech normal.        Behavior: Behavior normal. Behavior is cooperative.       Assessment and Plan:   Megan Booth was seen today for c/o sternum pain.  Diagnoses and all orders for this visit:  Sternum pain No red flags on exam. Pain with palpation -- suspect MSK-related with surrounding rib contusion. She is concerned about her prior surgery site being affected and having uncontrolled GERD -- recommended she follow-up with Dr. Ardis Booth' office for further follow-up but in the meantime will start protonix while holding prilosec and pepcid. Will obtain chest xray to r/o bony pathology. Worsening precautions advised. -     DG Chest 2 View; Future  Other orders -     pantoprazole (PROTONIX) 40 MG tablet; Take 1 tablet (40 mg total) by  mouth daily.  Reviewed expectations re: course of current medical issues. Discussed self-management of symptoms. Outlined signs and symptoms indicating need for more acute intervention.  Patient verbalized understanding and all questions were answered. See orders for this visit as documented in the electronic medical record. Patient received an After-Visit Summary.  CMA or LPN served as scribe during this visit. History, Physical, and Plan performed by medical provider. The above documentation has been reviewed and is accurate and complete.   Inda Coke, PA-C

## 2019-12-15 NOTE — Patient Instructions (Addendum)
It was great to see you!  Let's start 40 mg Protonix daily. Hold your pepcid and prilosec while on this medication.  Chest xray to make no bony damage from your trauma.  Follow-up with Dr. Ardis Hughs office to see if further evaluation is needed and to follow-up on your heartburn control from the changes that I made today.  An order for an xray has been put in for you. To get your xray, you can walk in at the [facility] location without a scheduled appointment. The address is [address]. It is across the street from [place] is located in the basement.  Hours of operation are M-F 8:30am to 5:00pm. Please note that they are closed for lunch between 12:30 and 1:00pm.  Take care,  Inda Coke PA-C

## 2019-12-15 NOTE — Telephone Encounter (Signed)
Still waiting on prior auth decision

## 2019-12-15 NOTE — Telephone Encounter (Signed)
Patient is stating pharmacy has still not received the prior authorization letter for her Ambien she would like it sent to [place], [place]. Thank you.

## 2019-12-16 ENCOUNTER — Telehealth: Payer: Self-pay

## 2019-12-16 NOTE — Telephone Encounter (Signed)
Megan Booth KeyRosalyn Gess - PA Case ID: [id number] Need help? Call us at [phone] Status Sent to [facility] Zolpidem Tartrate 10MG  tablets Form [employer] Electronic PA Form [id number] NCPDP)

## 2019-12-17 ENCOUNTER — Other Ambulatory Visit: Payer: Self-pay

## 2019-12-17 ENCOUNTER — Ambulatory Visit (INDEPENDENT_AMBULATORY_CARE_PROVIDER_SITE_OTHER)
Admission: RE | Admit: 2019-12-17 | Discharge: 2019-12-17 | Disposition: A | Discharge status: Home / Self Care | Payer: Medicare Other | Source: Ambulatory Visit | Attending: Physician Assistant | Admitting: Physician Assistant

## 2019-12-17 ENCOUNTER — Other Ambulatory Visit: Payer: Self-pay | Admitting: Family Medicine

## 2019-12-17 DIAGNOSIS — K449 Diaphragmatic hernia without obstruction or gangrene: Secondary | ICD-10-CM | POA: Diagnosis not present

## 2019-12-17 DIAGNOSIS — R0789 Other chest pain: Secondary | ICD-10-CM | POA: Diagnosis not present

## 2019-12-18 NOTE — Telephone Encounter (Signed)
Received fax from [place] for Zolpidem has been approved thru 11/16/2019 to 12/15/2020.

## 2020-01-13 DIAGNOSIS — D2271 Melanocytic nevi of right lower limb, including hip: Secondary | ICD-10-CM | POA: Diagnosis not present

## 2020-01-13 DIAGNOSIS — Z85828 Personal history of other malignant neoplasm of skin: Secondary | ICD-10-CM | POA: Diagnosis not present

## 2020-01-13 DIAGNOSIS — L821 Other seborrheic keratosis: Secondary | ICD-10-CM | POA: Diagnosis not present

## 2020-01-13 DIAGNOSIS — L820 Inflamed seborrheic keratosis: Secondary | ICD-10-CM | POA: Diagnosis not present

## 2020-01-13 DIAGNOSIS — L814 Other melanin hyperpigmentation: Secondary | ICD-10-CM | POA: Diagnosis not present

## 2020-01-13 DIAGNOSIS — D225 Melanocytic nevi of trunk: Secondary | ICD-10-CM | POA: Diagnosis not present

## 2020-01-29 DIAGNOSIS — K449 Diaphragmatic hernia without obstruction or gangrene: Secondary | ICD-10-CM | POA: Diagnosis not present

## 2020-02-04 ENCOUNTER — Encounter: Payer: Self-pay | Admitting: Family Medicine

## 2020-02-04 ENCOUNTER — Other Ambulatory Visit: Payer: Self-pay

## 2020-02-04 ENCOUNTER — Ambulatory Visit (INDEPENDENT_AMBULATORY_CARE_PROVIDER_SITE_OTHER): Payer: Medicare Other

## 2020-02-04 DIAGNOSIS — Z23 Encounter for immunization: Secondary | ICD-10-CM

## 2020-02-09 ENCOUNTER — Other Ambulatory Visit: Payer: Self-pay | Admitting: General Surgery

## 2020-02-09 DIAGNOSIS — K449 Diaphragmatic hernia without obstruction or gangrene: Secondary | ICD-10-CM

## 2020-02-15 ENCOUNTER — Ambulatory Visit
Admission: RE | Admit: 2020-02-15 | Discharge: 2020-02-15 | Disposition: A | Discharge status: Home / Self Care | Payer: Medicare Other | Source: Ambulatory Visit | Attending: General Surgery | Admitting: General Surgery

## 2020-02-15 DIAGNOSIS — K449 Diaphragmatic hernia without obstruction or gangrene: Secondary | ICD-10-CM

## 2020-02-15 DIAGNOSIS — K2100 Gastro-esophageal reflux disease with esophagitis, without bleeding: Secondary | ICD-10-CM | POA: Diagnosis not present

## 2020-02-17 ENCOUNTER — Other Ambulatory Visit (HOSPITAL_COMMUNITY): Payer: Self-pay | Admitting: Internal Medicine

## 2020-02-17 ENCOUNTER — Ambulatory Visit: Payer: Medicare Other | Attending: Internal Medicine

## 2020-02-17 DIAGNOSIS — Z23 Encounter for immunization: Secondary | ICD-10-CM

## 2020-02-17 NOTE — Progress Notes (Signed)
   Covid-19 Vaccination Clinic  Name:  Megan Booth    MRN: [id number] DOB: [birth year]  02/17/2020  [name] was observed post Covid-19 immunization for 15 minutes without incident. She was provided with Vaccine Information Sheet and instruction to access the V-Safe system.   [name] was instructed to call 911 with any severe reactions post vaccine: Marland Kitchen Difficulty breathing  . Swelling of face and throat  . A fast heartbeat  . A bad rash all over body  . Dizziness and weakness

## 2020-02-23 ENCOUNTER — Telehealth: Payer: Self-pay | Admitting: Family Medicine

## 2020-02-23 NOTE — Telephone Encounter (Signed)
Left message for patient to call back and schedule Medicare Annual Wellness Visit (AWV) either virtually/audio only OR in office. Whatever the patients preference is.  Last AWV 09/14/16; please schedule at anytime with [facility]Nurse Health Advisor at [facility].  This should be a 45 minute visit.

## 2020-02-25 ENCOUNTER — Ambulatory Visit: Payer: Medicare Other | Admitting: Family Medicine

## 2020-02-25 DIAGNOSIS — K449 Diaphragmatic hernia without obstruction or gangrene: Secondary | ICD-10-CM | POA: Diagnosis not present

## 2020-02-29 ENCOUNTER — Other Ambulatory Visit: Payer: Self-pay | Admitting: Family Medicine

## 2020-03-22 ENCOUNTER — Encounter: Payer: Self-pay | Admitting: Family Medicine

## 2020-03-23 DIAGNOSIS — M25561 Pain in right knee: Secondary | ICD-10-CM | POA: Diagnosis not present

## 2020-03-24 DIAGNOSIS — H43813 Vitreous degeneration, bilateral: Secondary | ICD-10-CM | POA: Diagnosis not present

## 2020-04-05 ENCOUNTER — Other Ambulatory Visit: Payer: Self-pay | Admitting: Family Medicine

## 2020-06-07 ENCOUNTER — Other Ambulatory Visit: Payer: Self-pay | Admitting: Gastroenterology

## 2020-06-28 DIAGNOSIS — M25561 Pain in right knee: Secondary | ICD-10-CM | POA: Diagnosis not present

## 2020-07-02 ENCOUNTER — Other Ambulatory Visit: Payer: Self-pay | Admitting: Family Medicine

## 2020-07-19 ENCOUNTER — Telehealth: Payer: Self-pay | Admitting: Family Medicine

## 2020-07-19 NOTE — Telephone Encounter (Signed)
Spoke to patient to  schedule Medicare Annual Wellness Visit (AWV) either virtually or in office.  She stated she would call back to schedule  Last AWV 09/14/16  please schedule at anytime   This should be a 45 minute visit.

## 2020-08-18 DIAGNOSIS — M9901 Segmental and somatic dysfunction of cervical region: Secondary | ICD-10-CM | POA: Diagnosis not present

## 2020-08-18 DIAGNOSIS — M5411 Radiculopathy, occipito-atlanto-axial region: Secondary | ICD-10-CM | POA: Diagnosis not present

## 2020-08-22 DIAGNOSIS — M9901 Segmental and somatic dysfunction of cervical region: Secondary | ICD-10-CM | POA: Diagnosis not present

## 2020-08-22 DIAGNOSIS — M5411 Radiculopathy, occipito-atlanto-axial region: Secondary | ICD-10-CM | POA: Diagnosis not present

## 2020-08-23 DIAGNOSIS — M9901 Segmental and somatic dysfunction of cervical region: Secondary | ICD-10-CM | POA: Diagnosis not present

## 2020-08-23 DIAGNOSIS — M5411 Radiculopathy, occipito-atlanto-axial region: Secondary | ICD-10-CM | POA: Diagnosis not present

## 2020-08-24 DIAGNOSIS — M5411 Radiculopathy, occipito-atlanto-axial region: Secondary | ICD-10-CM | POA: Diagnosis not present

## 2020-08-24 DIAGNOSIS — M9901 Segmental and somatic dysfunction of cervical region: Secondary | ICD-10-CM | POA: Diagnosis not present

## 2020-09-05 DIAGNOSIS — M9901 Segmental and somatic dysfunction of cervical region: Secondary | ICD-10-CM | POA: Diagnosis not present

## 2020-09-05 DIAGNOSIS — M5411 Radiculopathy, occipito-atlanto-axial region: Secondary | ICD-10-CM | POA: Diagnosis not present

## 2020-09-07 DIAGNOSIS — M5411 Radiculopathy, occipito-atlanto-axial region: Secondary | ICD-10-CM | POA: Diagnosis not present

## 2020-09-07 DIAGNOSIS — M9901 Segmental and somatic dysfunction of cervical region: Secondary | ICD-10-CM | POA: Diagnosis not present

## 2020-09-12 DIAGNOSIS — M9901 Segmental and somatic dysfunction of cervical region: Secondary | ICD-10-CM | POA: Diagnosis not present

## 2020-09-12 DIAGNOSIS — M5411 Radiculopathy, occipito-atlanto-axial region: Secondary | ICD-10-CM | POA: Diagnosis not present

## 2020-09-14 DIAGNOSIS — M5411 Radiculopathy, occipito-atlanto-axial region: Secondary | ICD-10-CM | POA: Diagnosis not present

## 2020-09-14 DIAGNOSIS — M9901 Segmental and somatic dysfunction of cervical region: Secondary | ICD-10-CM | POA: Diagnosis not present

## 2020-09-22 ENCOUNTER — Telehealth (INDEPENDENT_AMBULATORY_CARE_PROVIDER_SITE_OTHER): Payer: Medicare Other | Admitting: Registered Nurse

## 2020-09-22 DIAGNOSIS — U071 COVID-19: Secondary | ICD-10-CM | POA: Diagnosis not present

## 2020-09-22 MED ORDER — AZELASTINE HCL 0.1 % NA SOLN: 1.0000 | Freq: Two times a day (BID) | NASAL | 12 refills | Status: DC

## 2020-09-22 MED ORDER — MOLNUPIRAVIR EUA 200MG CAPSULE: 4.0000 | ORAL_CAPSULE | Freq: Two times a day (BID) | ORAL | 0 refills | Status: AC

## 2020-09-22 MED ORDER — BENZONATATE 100 MG PO CAPS: 100.0000 mg | ORAL_CAPSULE | Freq: Two times a day (BID) | ORAL | 0 refills | Status: DC | PRN

## 2020-09-22 NOTE — Progress Notes (Signed)
Telemedicine Encounter- SOAP NOTE Established Patient  This telephone encounter was conducted with the patient's (or proxy's) verbal consent via audio telecommunications: yes  Patient was instructed to have this encounter in a suitably private space; and to only have persons present to whom they give permission to participate. In addition, patient identity was confirmed by use of name plus two identifiers (DOB and address).  I discussed the limitations, risks, security and privacy concerns of performing an evaluation and management service by telephone and the availability of in person appointments. I also discussed with the patient that there may be a patient responsible charge related to this service. The patient expressed understanding and agreed to proceed.  I spent a total of 15 minutes talking with the patient or their proxy.  Chief Complaint  Patient presents with  . Covid Positive    Subjective   Megan Booth is a 75 y.o. established patient. Telephone visit today for covid  HPI Positive home test Husband is also positive Vaccinated and boosted So far only mild upper respiratory symptoms Of note, hx of ckd Otherwise no chronic conditions Interested in antivirals  Patient Active Problem List   Diagnosis Date Noted  . Tendonitis 07/15/2019  . CKD (chronic kidney disease), stage III ([place]) 08/25/2018  . Osteopenia 04/15/2015  . Hormone replacement therapy 08/12/2014  . Edema 08/12/2014  . Insomnia 08/12/2014  . Amnesia, global, transient   . Hyperlipidemia   . GERD (gastroesophageal reflux disease)   . Hypothyroidism   . Sleep apnea   . Hx of colonic polyps   . Anxiety   . Leukoplakia, vulva   . B12 deficiency 08/03/2011    Past Medical History:  Diagnosis Date  . Amnesia, global, transient 2015   see hospital notes  . Anxiety   . Barrett esophagus    Dr. Lajoyce Corners  . Cancer ([place])    left wrist skin cancer  . Diverticulosis   . GERD (gastroesophageal  reflux disease)   . Hx of colonic polyps    Dr. Lajoyce Corners   . Hyperlipidemia   . Hypothyroidism   . Internal hemorrhoid   . Leukoplakia, vulva   . Sleep apnea   . Tendonitis 07/15/2019   Right foot posterior tibialis    Current Outpatient Medications  Medication Sig Dispense Refill  . acetaminophen (TYLENOL) 500 MG tablet Take 500-1,000 mg by mouth every 6 (six) hours as needed for mild pain or headache.     . Artificial Tear Ointment (DRY EYES OP) Place 1 drop into both eyes daily as needed (Dry eye).    Marland Kitchen atorvastatin (LIPITOR) 40 MG tablet TAKE 1 TABLET ONCE DAILY AT 6 PM. 90 tablet 3  . Cholecalciferol (VITAMIN D3) 1000 units CAPS Take 1,000 Units by mouth daily.     Marland Kitchen COVID-19 mRNA vaccine, Pfizer, 30 MCG/0.3ML injection INJECT AS DIRECTED .3 mL 0  . Estradiol-Estriol-Progesterone (BIEST/PROGESTERONE TD) Take 1 capsule by mouth daily.     . furosemide (LASIX) 20 MG tablet TAKE ONE TABLET DAILY AS NEEDED FOR FLUID OR EDEMA. (Patient taking differently: Take 20 mg by mouth daily as needed for fluid. ) 90 tablet 0  . levothyroxine (SYNTHROID) 25 MCG tablet TAKE 1 TABLET ONCE DAILY. 90 tablet 0  . Melatonin 10 MG TABS Take 1 tablet by mouth daily as needed (sleep).     . nystatin-triamcinolone (MYCOLOG II) cream Apply 1 application topically daily as needed (female lesions).     Marland Kitchen omeprazole (PRILOSEC) 40 MG capsule  TAKE ONE CAPSULE DAILY BEFORE LUNCH TIME MEAL. 30 capsule 11  . Oxymetazoline HCl (NASAL SPRAY) 0.05 % SOLN Place 1 spray into the nose daily as needed (allergies and congestion).    Marland Kitchen PARoxetine (PAXIL) 10 MG tablet Take 10 mg by mouth daily.     Marland Kitchen zolpidem (AMBIEN) 10 MG tablet TAKE 1/2 TO 1 TABLET AT BEDTIME AS NEEDED FOR SLEEP. 45 tablet 3   No current facility-administered medications for this visit.    No Known Allergies  Social History   Socioeconomic History  . Marital status: Married    Spouse name: Not on file  . Number of children: 0  . Years of education: Not  on file  . Highest education level: Not on file  Occupational History  . Occupation: Chiropractor -retired    Fish farm manager: [employer]  Tobacco Use  . Smoking status: Never Smoker  . Smokeless tobacco: Never Used  Vaping Use  . Vaping Use: Never used  Substance and Sexual Activity  . Alcohol use: Yes    Alcohol/week: 1.0 standard drink    Types: 1 Glasses of wine per week    Comment: rarely  . Drug use: No  . Sexual activity: Never    Birth control/protection: Surgical    Comment: BTL  Other Topics Concern  . Not on file  Social History Narrative   Married (Husband with Dr. Jerline Pain she thinks), no children.       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   HENT: Positive for congestion.   Eyes: Negative.   Respiratory: Negative.   Cardiovascular: Negative.   Gastrointestinal: Negative.   Genitourinary: Negative.   Musculoskeletal: Negative.   Skin: Negative.   Neurological: Negative.   Endo/Heme/Allergies: Negative.   Psychiatric/Behavioral: Negative.   All other systems reviewed and are negative.   Objective   Vitals as reported by the patient: There were no vitals filed for this visit.  There are no diagnoses linked to this encounter.  PLAN  In setting of CKD will pursue molnupiravir.   Supportive care discussed. Will give tessalon and azelastine  Return if worsening or failing to improve  ER precautions reviewed with patient who voices understanding  Patient encouraged to call clinic with any questions, comments, or concerns.  I discussed the assessment and treatment plan with  the patient. The patient was provided an opportunity to ask questions and all were answered. The patient agreed with the plan and demonstrated an understanding of the instructions.   The patient was advised to call back or seek an in-person evaluation if the symptoms worsen or if the condition fails to improve as anticipated.  I provided 15 minutes of non-face-to-face time during this encounter.  Maximiano Coss, NP  Primary Care at [facility]

## 2020-10-05 DIAGNOSIS — M9901 Segmental and somatic dysfunction of cervical region: Secondary | ICD-10-CM | POA: Diagnosis not present

## 2020-10-05 DIAGNOSIS — M5411 Radiculopathy, occipito-atlanto-axial region: Secondary | ICD-10-CM | POA: Diagnosis not present

## 2020-10-06 DIAGNOSIS — L900 Lichen sclerosus et atrophicus: Secondary | ICD-10-CM | POA: Diagnosis not present

## 2020-10-06 DIAGNOSIS — Z6833 Body mass index (BMI) 33.0-33.9, adult: Secondary | ICD-10-CM | POA: Diagnosis not present

## 2020-10-06 DIAGNOSIS — N762 Acute vulvitis: Secondary | ICD-10-CM | POA: Diagnosis not present

## 2020-10-06 DIAGNOSIS — Z01419 Encounter for gynecological examination (general) (routine) without abnormal findings: Secondary | ICD-10-CM | POA: Diagnosis not present

## 2020-10-06 DIAGNOSIS — N951 Menopausal and female climacteric states: Secondary | ICD-10-CM | POA: Diagnosis not present

## 2020-10-11 DIAGNOSIS — M9901 Segmental and somatic dysfunction of cervical region: Secondary | ICD-10-CM | POA: Diagnosis not present

## 2020-10-11 DIAGNOSIS — R399 Unspecified symptoms and signs involving the genitourinary system: Secondary | ICD-10-CM | POA: Diagnosis not present

## 2020-10-11 DIAGNOSIS — M5411 Radiculopathy, occipito-atlanto-axial region: Secondary | ICD-10-CM | POA: Diagnosis not present

## 2020-10-18 DIAGNOSIS — M5411 Radiculopathy, occipito-atlanto-axial region: Secondary | ICD-10-CM | POA: Diagnosis not present

## 2020-10-18 DIAGNOSIS — M9901 Segmental and somatic dysfunction of cervical region: Secondary | ICD-10-CM | POA: Diagnosis not present

## 2020-10-21 ENCOUNTER — Encounter: Payer: Self-pay | Admitting: Family Medicine

## 2020-10-21 ENCOUNTER — Other Ambulatory Visit: Payer: Self-pay | Admitting: Family Medicine

## 2020-10-25 ENCOUNTER — Other Ambulatory Visit: Payer: Self-pay | Admitting: Family Medicine

## 2020-10-25 DIAGNOSIS — M9901 Segmental and somatic dysfunction of cervical region: Secondary | ICD-10-CM | POA: Diagnosis not present

## 2020-10-25 DIAGNOSIS — R399 Unspecified symptoms and signs involving the genitourinary system: Secondary | ICD-10-CM | POA: Diagnosis not present

## 2020-10-25 DIAGNOSIS — M5411 Radiculopathy, occipito-atlanto-axial region: Secondary | ICD-10-CM | POA: Diagnosis not present

## 2020-10-25 NOTE — Telephone Encounter (Signed)
Spoke to pt told her I sent in a 90 day supply of both Rx's requested. Do you know you have an appt on 7/7 with Dr. Yong Channel? Pt said yes, she just made it. Told her okay your message said could not get an appt till after August. Pt said yes, but I was able to schedule. Told pt okay see you then.

## 2020-10-27 ENCOUNTER — Other Ambulatory Visit: Payer: Self-pay

## 2020-10-27 ENCOUNTER — Encounter: Payer: Self-pay | Admitting: Family Medicine

## 2020-10-27 ENCOUNTER — Ambulatory Visit (INDEPENDENT_AMBULATORY_CARE_PROVIDER_SITE_OTHER): Payer: Medicare Other | Admitting: Family Medicine

## 2020-10-27 VITALS — BP 120/69 | HR 76 | Temp 97.6°F | Ht 62.0 in | Wt 184.4 lb

## 2020-10-27 DIAGNOSIS — N183 Chronic kidney disease, stage 3 unspecified: Secondary | ICD-10-CM | POA: Diagnosis not present

## 2020-10-27 DIAGNOSIS — Z79899 Other long term (current) drug therapy: Secondary | ICD-10-CM | POA: Diagnosis not present

## 2020-10-27 DIAGNOSIS — E039 Hypothyroidism, unspecified: Secondary | ICD-10-CM | POA: Diagnosis not present

## 2020-10-27 DIAGNOSIS — E785 Hyperlipidemia, unspecified: Secondary | ICD-10-CM

## 2020-10-27 DIAGNOSIS — Z23 Encounter for immunization: Secondary | ICD-10-CM | POA: Diagnosis not present

## 2020-10-27 LAB — CBC WITH DIFFERENTIAL/PLATELET
Basophils Absolute: 0.1 10*3/uL (ref 0.0–0.1)
Basophils Relative: 1.1 % (ref 0.0–3.0)
Eosinophils Absolute: 0.5 10*3/uL (ref 0.0–0.7)
Eosinophils Relative: 7.8 % — ABNORMAL HIGH (ref 0.0–5.0)
HCT: 35.9 % — ABNORMAL LOW (ref 36.0–46.0)
Hemoglobin: 12 g/dL (ref 12.0–15.0)
Lymphocytes Relative: 28.7 % (ref 12.0–46.0)
Lymphs Abs: 1.9 10*3/uL (ref 0.7–4.0)
MCHC: 33.5 g/dL (ref 30.0–36.0)
MCV: 94.8 fl (ref 78.0–100.0)
Monocytes Absolute: 0.6 10*3/uL (ref 0.1–1.0)
Monocytes Relative: 8.4 % (ref 3.0–12.0)
Neutro Abs: 3.7 10*3/uL (ref 1.4–7.7)
Neutrophils Relative %: 54 % (ref 43.0–77.0)
Platelets: 260 10*3/uL (ref 150.0–400.0)
RBC: 3.79 Mil/uL — ABNORMAL LOW (ref 3.87–5.11)
RDW: 14.2 % (ref 11.5–15.5)
WBC: 6.8 10*3/uL (ref 4.0–10.5)

## 2020-10-27 LAB — COMPREHENSIVE METABOLIC PANEL
ALT: 20 U/L (ref 0–35)
AST: 17 U/L (ref 0–37)
Albumin: 4.2 g/dL (ref 3.5–5.2)
Alkaline Phosphatase: 62 U/L (ref 39–117)
BUN: 24 mg/dL — ABNORMAL HIGH (ref 6–23)
CO2: 25 mEq/L (ref 19–32)
Calcium: 9.5 mg/dL (ref 8.4–10.5)
Chloride: 105 mEq/L (ref 96–112)
Creatinine, Ser: 1.09 mg/dL (ref 0.40–1.20)
GFR: 50.58 mL/min — ABNORMAL LOW (ref >60.00)
Glucose, Bld: 106 mg/dL — ABNORMAL HIGH (ref 70–99)
Potassium: 4.4 mEq/L (ref 3.5–5.1)
Sodium: 140 mEq/L (ref 135–145)
Total Bilirubin: 0.7 mg/dL (ref 0.2–1.2)
Total Protein: 6.6 g/dL (ref 6.0–8.3)

## 2020-10-27 LAB — LIPID PANEL
Cholesterol: 181 mg/dL (ref 0–200)
HDL: 47 mg/dL (ref >39.00)
NonHDL: 134.32
Total CHOL/HDL Ratio: 4
Triglycerides: 272 mg/dL — ABNORMAL HIGH (ref 0.0–149.0)
VLDL: 54.4 mg/dL — ABNORMAL HIGH (ref 0.0–40.0)

## 2020-10-27 LAB — LDL CHOLESTEROL, DIRECT: Direct LDL: 85 mg/dL

## 2020-10-27 LAB — TSH: TSH: 2.52 u[IU]/mL (ref 0.35–5.50)

## 2020-10-27 LAB — VITAMIN B12: Vitamin B-12: 358 pg/mL (ref 211–911)

## 2020-10-27 MED ORDER — PAROXETINE HCL 10 MG PO TABS: 10.0000 mg | ORAL_TABLET | Freq: Every day | ORAL | 3 refills | Status: DC

## 2020-10-27 MED ORDER — LEVOTHYROXINE SODIUM 25 MCG PO TABS: 25.0000 ug | ORAL_TABLET | Freq: Every day | ORAL | 3 refills | Status: DC

## 2020-10-27 MED ORDER — FUROSEMIDE 20 MG PO TABS: 20.0000 mg | ORAL_TABLET | Freq: Every day | ORAL | 3 refills | Status: AC | PRN

## 2020-10-27 MED ORDER — ATORVASTATIN CALCIUM 40 MG PO TABS: ORAL_TABLET | ORAL | 3 refills | Status: DC

## 2020-10-27 NOTE — Progress Notes (Signed)
Phone [phone] In person visit   Subjective:   Megan Booth is a 75 y.o. year old very pleasant female patient who presents for/with See problem oriented charting Chief Complaint  Patient presents with   Hyperlipidemia   Hypothyroidism    This visit occurred during the SARS-CoV-2 public health emergency.  Safety protocols were in place, including screening questions prior to the visit, additional usage of staff PPE, and extensive cleaning of exam room while observing appropriate contact time as indicated for disinfecting solutions.   Past Medical History-  Patient Active Problem List   Diagnosis Date Noted   Amnesia, global, transient     Priority: High   CKD (chronic kidney disease), stage III ([place]) 08/25/2018    Priority: Medium   Osteopenia 04/15/2015    Priority: Medium   Hormone replacement therapy 08/12/2014    Priority: Medium   Insomnia 08/12/2014    Priority: Medium   Hyperlipidemia     Priority: Medium   Hypothyroidism     Priority: Medium   Sleep apnea     Priority: Medium   Anxiety     Priority: Medium   Tendonitis 07/15/2019    Priority: Low   Edema 08/12/2014    Priority: Low   GERD (gastroesophageal reflux disease)     Priority: Low   Hx of colonic polyps     Priority: Low   Leukoplakia, vulva     Priority: Low   B12 deficiency 08/03/2011    Priority: Low    Medications- reviewed and updated Current Outpatient Medications  Medication Sig Dispense Refill   acetaminophen (TYLENOL) 500 MG tablet Take 500-1,000 mg by mouth every 6 (six) hours as needed for mild pain or headache.      Artificial Tear Ointment (DRY EYES OP) Place 1 drop into both eyes daily as needed (Dry eye).     azelastine (ASTELIN) 0.1 % nasal spray Place 1 spray into both nostrils 2 (two) times daily. Use in each nostril as directed 30 mL 12   Cholecalciferol (VITAMIN D3) 1000 units CAPS Take 1,000 Units by mouth daily.      Estradiol-Estriol-Progesterone  (BIEST/PROGESTERONE TD) Take 1 capsule by mouth daily.      Melatonin 10 MG TABS Take 1 tablet by mouth daily as needed (sleep).      nitrofurantoin, macrocrystal-monohydrate, (MACROBID) 100 MG capsule Take 100 mg by mouth 2 (two) times daily.     nystatin-triamcinolone (MYCOLOG II) cream Apply 1 application topically daily as needed (female lesions).      omeprazole (PRILOSEC) 40 MG capsule TAKE ONE CAPSULE DAILY BEFORE LUNCH TIME MEAL. 30 capsule 11   zolpidem (AMBIEN) 10 MG tablet TAKE 1/2 TO 1 TABLET AT BEDTIME AS NEEDED FOR SLEEP. 45 tablet 3   atorvastatin (LIPITOR) 40 MG tablet TAKE 1 TABLET ONCE DAILY AT 6 PM. 90 tablet 3   furosemide (LASIX) 20 MG tablet Take 1 tablet (20 mg total) by mouth daily as needed for fluid. 90 tablet 3   levothyroxine (SYNTHROID) 25 MCG tablet Take 1 tablet (25 mcg total) by mouth daily. 90 tablet 3   PARoxetine (PAXIL) 10 MG tablet Take 1 tablet (10 mg total) by mouth daily. 90 tablet 3   No current facility-administered medications for this visit.     Objective:  BP 120/69   Pulse 76   Temp 97.6 F (36.4 C) (Temporal)   Ht 5\' 2"  (1.575 m)   Wt 184 lb 6.4 oz (83.6 kg)   SpO2 96%  BMI 33.73 kg/m  Gen: NAD, resting comfortably CV: RRR no murmurs rubs or gallops Lungs: CTAB no crackles, wheeze, rhonchi Abdomen: soft/nontender/nondistended/normal bowel sounds.  Ext: no edema Skin: warm, dry    Assessment and Plan   #hyperlipidemia S: Medication: atorvastatin 40mg . LDL goal at least under 100, <70 more ideal.  Lab Results  Component Value Date   CHOL 222 (H) 09/22/2019   HDL 41.00 09/22/2019   LDLCALC 101 (H) 09/15/2013   LDLDIRECT 117.0 09/22/2019   TRIG 364.0 (H) 09/22/2019   CHOLHDL 5 09/22/2019   A/P: Update lipid panel today-hoping LDL close to 100-no prior records to see if LDL has reduced 30%- likely to continue current meds unless LDl above 130  #hypothyroidism- on and off for 30 years- has had to restart when came off in  past S: compliant On thyroid medication- levothyroxine 25 mcg  Lab Results  Component Value Date   TSH 2.02 09/22/2019  A/P: hopefully stable- update TSH today. Continue current meds for now  # GERD s/p hiatal hernia surgery April 2021 S: Medication: Prilosec 40 mg Rx through gastroenterology.  Also on pepcid A/P: Stable. Continue current medications. And follow up with GI  - still has required meds after hiatal hernia surgery  #B12 deficiency S: Listed since 2013. Current treatment: not right now. Long-term PPI places at risk for recurrence Lab Results  Component Value Date   VITAMINB12 242 09/22/2019   A/P: update labs- if below 400 recommend at least once a week b12, if below 250 potentially daily   # CKD III S: GFR range from 40s to 50s. -Patient has used ibuprofen for joint pain in the past two times a week (or other nsaids like aleve for back pain) Have strongly encouraged avoiding this and using combination of Voltaren gel were helpful and Tylenol arthritis   -She does occasionally get swelling and Lasix is helpful-fortunately only taking that about once every month or less A/P: hopefully stable- update CMP today. Continue current meds for now- prn lasix for edema    #Osteopenia S: Medication:vitamin D 1000 units.  Last bone density: Slight improvement December 2018, had in 2021 but do not ave records  Weightbearing exercise:trainer for weights and strength and pilates on Monday and Friday and also floor exercises  A/P: hopefully stable - get dexa results from [facility]- for now continue current meds   #Anxiety/insomnia  S: Anxiety previously managed by Celexa 10 mg-was changed byto Paxil 10 mg  Can sleep about 6 hours with half of 10 mg Ambien tablet every other da-patient was unable to sleep with full 5 mg tablet in the past interestingly  A/P: For anxiety-reasonable control- continue current meds  Insomnia-doing well on ambien- we discussed if has falls or sleep walking  in future would need to discontinue- also does melatonin in between with some help  #on hormone replacement through GYN- they have discussed risks  Recommended follow up: Return in about 6 months (around 04/29/2021) for next visit.  Lab/Order associations:   ICD-10-CM   1. Stage 3 chronic kidney disease, unspecified whether stage 3a or 3b CKD (HCC)  N18.30     2. Hypothyroidism, unspecified type  E03.9 TSH    3. Hyperlipidemia, unspecified hyperlipidemia type  E78.5 CBC with Differential/Platelet    Comprehensive metabolic panel    Lipid panel    4. High risk medication use  Z79.899 Vitamin B12     Meds ordered this encounter  Medications   atorvastatin (LIPITOR) 40 MG tablet  Sig: TAKE 1 TABLET ONCE DAILY AT 6 PM.    Dispense:  90 tablet    Refill:  3   furosemide (LASIX) 20 MG tablet    Sig: Take 1 tablet (20 mg total) by mouth daily as needed for fluid.    Dispense:  90 tablet    Refill:  3   levothyroxine (SYNTHROID) 25 MCG tablet    Sig: Take 1 tablet (25 mcg total) by mouth daily.    Dispense:  90 tablet    Refill:  3   PARoxetine (PAXIL) 10 MG tablet    Sig: Take 1 tablet (10 mg total) by mouth daily.    Dispense:  90 tablet    Refill:  3   I,Harris Phan,acting as a scribe for Garret Reddish, MD.,have documented all relevant documentation on the behalf of Garret Reddish, MD,as directed by  Garret Reddish, MD while in the presence of Garret Reddish, MD.  I, Garret Reddish, MD, have reviewed all documentation for this visit. The documentation on 10/27/20 for the exam, diagnosis, procedures, and orders are all accurate and complete.  Return precautions advised.  Garret Reddish, MD

## 2020-10-27 NOTE — Addendum Note (Signed)
Addended by: Linton Ham on: 10/27/2020 11:38 AM   Modules accepted: Orders

## 2020-10-27 NOTE — Patient Instructions (Addendum)
Health Maintenance Due  Topic Date Due   COVID-19 Vaccine (4 - Booster for [employer] series) Patient will get this scheduled in the fall. Would do 4-6 months after having covid in june 05/19/2020   Please stop by lab before you go If you have mychart- we will send your results within 3 business days of [place] receiving them.  If you do not have mychart- we will call you about results within 5 business days of [place] receiving them.  *please also note that you will see labs on mychart as soon as they post. I will later go in and write notes on them- will say "notes from Dr. Yong Channel"  For your arthritis, you could trial Voltaren/diclofenac gel. Try to minimize ibuprofen and aleve.tylenol arthritis is a good choice as well  Team please contact [facility] and get a copy of her bone density and place in my basket for me to review  Prevnar 20 TODAY.  Recommended follow up: Return in about 6 months (around 04/29/2021) for next visit.

## 2020-10-31 ENCOUNTER — Encounter: Payer: Self-pay | Admitting: Family Medicine

## 2020-11-04 ENCOUNTER — Other Ambulatory Visit: Payer: Self-pay

## 2020-11-04 ENCOUNTER — Telehealth: Payer: Self-pay | Admitting: Family Medicine

## 2020-11-04 DIAGNOSIS — E669 Obesity, unspecified: Secondary | ICD-10-CM

## 2020-11-04 NOTE — Telephone Encounter (Signed)
Copied from [place] [id number]. Topic: Medicare AWV >> Nov 04, 2020 10:03 AM Harris-Coley, Hannah Beat wrote: Reason for CRM: Left message for patient to schedule Annual Wellness Visit.  Please schedule with Nurse Health Advisor Charlott Rakes, RN at [facility].

## 2020-11-15 DIAGNOSIS — M9901 Segmental and somatic dysfunction of cervical region: Secondary | ICD-10-CM | POA: Diagnosis not present

## 2020-11-15 DIAGNOSIS — M5411 Radiculopathy, occipito-atlanto-axial region: Secondary | ICD-10-CM | POA: Diagnosis not present

## 2020-11-26 ENCOUNTER — Other Ambulatory Visit: Payer: Self-pay | Admitting: Family Medicine

## 2020-12-01 DIAGNOSIS — M5411 Radiculopathy, occipito-atlanto-axial region: Secondary | ICD-10-CM | POA: Diagnosis not present

## 2020-12-01 DIAGNOSIS — M9901 Segmental and somatic dysfunction of cervical region: Secondary | ICD-10-CM | POA: Diagnosis not present

## 2020-12-13 DIAGNOSIS — M9901 Segmental and somatic dysfunction of cervical region: Secondary | ICD-10-CM | POA: Diagnosis not present

## 2020-12-13 DIAGNOSIS — M5411 Radiculopathy, occipito-atlanto-axial region: Secondary | ICD-10-CM | POA: Diagnosis not present

## 2021-01-03 DIAGNOSIS — M5411 Radiculopathy, occipito-atlanto-axial region: Secondary | ICD-10-CM | POA: Diagnosis not present

## 2021-01-03 DIAGNOSIS — M9901 Segmental and somatic dysfunction of cervical region: Secondary | ICD-10-CM | POA: Diagnosis not present

## 2021-01-05 DIAGNOSIS — K219 Gastro-esophageal reflux disease without esophagitis: Secondary | ICD-10-CM | POA: Diagnosis not present

## 2021-01-05 DIAGNOSIS — E538 Deficiency of other specified B group vitamins: Secondary | ICD-10-CM | POA: Diagnosis not present

## 2021-01-05 DIAGNOSIS — G4730 Sleep apnea, unspecified: Secondary | ICD-10-CM | POA: Diagnosis not present

## 2021-01-05 DIAGNOSIS — F419 Anxiety disorder, unspecified: Secondary | ICD-10-CM | POA: Diagnosis not present

## 2021-01-05 DIAGNOSIS — N183 Chronic kidney disease, stage 3 unspecified: Secondary | ICD-10-CM | POA: Diagnosis not present

## 2021-01-05 DIAGNOSIS — R7301 Impaired fasting glucose: Secondary | ICD-10-CM | POA: Diagnosis not present

## 2021-01-05 DIAGNOSIS — E785 Hyperlipidemia, unspecified: Secondary | ICD-10-CM | POA: Diagnosis not present

## 2021-01-05 DIAGNOSIS — Z6833 Body mass index (BMI) 33.0-33.9, adult: Secondary | ICD-10-CM | POA: Diagnosis not present

## 2021-01-05 DIAGNOSIS — E669 Obesity, unspecified: Secondary | ICD-10-CM | POA: Diagnosis not present

## 2021-01-05 DIAGNOSIS — N3946 Mixed incontinence: Secondary | ICD-10-CM | POA: Diagnosis not present

## 2021-01-05 DIAGNOSIS — Z86010 Personal history of colonic polyps: Secondary | ICD-10-CM | POA: Diagnosis not present

## 2021-01-16 DIAGNOSIS — M25512 Pain in left shoulder: Secondary | ICD-10-CM | POA: Diagnosis not present

## 2021-01-17 DIAGNOSIS — M9901 Segmental and somatic dysfunction of cervical region: Secondary | ICD-10-CM | POA: Diagnosis not present

## 2021-01-17 DIAGNOSIS — M5411 Radiculopathy, occipito-atlanto-axial region: Secondary | ICD-10-CM | POA: Diagnosis not present

## 2021-01-25 DIAGNOSIS — D2261 Melanocytic nevi of right upper limb, including shoulder: Secondary | ICD-10-CM | POA: Diagnosis not present

## 2021-01-25 DIAGNOSIS — D2262 Melanocytic nevi of left upper limb, including shoulder: Secondary | ICD-10-CM | POA: Diagnosis not present

## 2021-01-25 DIAGNOSIS — D2271 Melanocytic nevi of right lower limb, including hip: Secondary | ICD-10-CM | POA: Diagnosis not present

## 2021-01-25 DIAGNOSIS — D225 Melanocytic nevi of trunk: Secondary | ICD-10-CM | POA: Diagnosis not present

## 2021-01-25 DIAGNOSIS — L918 Other hypertrophic disorders of the skin: Secondary | ICD-10-CM | POA: Diagnosis not present

## 2021-01-25 DIAGNOSIS — Z85828 Personal history of other malignant neoplasm of skin: Secondary | ICD-10-CM | POA: Diagnosis not present

## 2021-01-25 DIAGNOSIS — L821 Other seborrheic keratosis: Secondary | ICD-10-CM | POA: Diagnosis not present

## 2021-01-25 DIAGNOSIS — L814 Other melanin hyperpigmentation: Secondary | ICD-10-CM | POA: Diagnosis not present

## 2021-01-31 DIAGNOSIS — M9901 Segmental and somatic dysfunction of cervical region: Secondary | ICD-10-CM | POA: Diagnosis not present

## 2021-01-31 DIAGNOSIS — M5411 Radiculopathy, occipito-atlanto-axial region: Secondary | ICD-10-CM | POA: Diagnosis not present

## 2021-02-14 DIAGNOSIS — E039 Hypothyroidism, unspecified: Secondary | ICD-10-CM | POA: Diagnosis not present

## 2021-02-14 DIAGNOSIS — N950 Postmenopausal bleeding: Secondary | ICD-10-CM | POA: Diagnosis not present

## 2021-02-16 DIAGNOSIS — Z1231 Encounter for screening mammogram for malignant neoplasm of breast: Secondary | ICD-10-CM | POA: Diagnosis not present

## 2021-02-21 DIAGNOSIS — M5411 Radiculopathy, occipito-atlanto-axial region: Secondary | ICD-10-CM | POA: Diagnosis not present

## 2021-02-21 DIAGNOSIS — M9901 Segmental and somatic dysfunction of cervical region: Secondary | ICD-10-CM | POA: Diagnosis not present

## 2021-02-22 DIAGNOSIS — R7301 Impaired fasting glucose: Secondary | ICD-10-CM | POA: Diagnosis not present

## 2021-02-22 DIAGNOSIS — E669 Obesity, unspecified: Secondary | ICD-10-CM | POA: Diagnosis not present

## 2021-02-22 DIAGNOSIS — E785 Hyperlipidemia, unspecified: Secondary | ICD-10-CM | POA: Diagnosis not present

## 2021-02-22 DIAGNOSIS — N950 Postmenopausal bleeding: Secondary | ICD-10-CM | POA: Diagnosis not present

## 2021-03-07 DIAGNOSIS — M9901 Segmental and somatic dysfunction of cervical region: Secondary | ICD-10-CM | POA: Diagnosis not present

## 2021-03-07 DIAGNOSIS — M5411 Radiculopathy, occipito-atlanto-axial region: Secondary | ICD-10-CM | POA: Diagnosis not present

## 2021-03-10 DIAGNOSIS — N950 Postmenopausal bleeding: Secondary | ICD-10-CM | POA: Diagnosis not present

## 2021-03-10 DIAGNOSIS — N939 Abnormal uterine and vaginal bleeding, unspecified: Secondary | ICD-10-CM | POA: Diagnosis not present

## 2021-03-10 DIAGNOSIS — R82998 Other abnormal findings in urine: Secondary | ICD-10-CM | POA: Diagnosis not present

## 2021-03-21 DIAGNOSIS — M5411 Radiculopathy, occipito-atlanto-axial region: Secondary | ICD-10-CM | POA: Diagnosis not present

## 2021-03-21 DIAGNOSIS — M9901 Segmental and somatic dysfunction of cervical region: Secondary | ICD-10-CM | POA: Diagnosis not present

## 2021-03-22 DIAGNOSIS — M7061 Trochanteric bursitis, right hip: Secondary | ICD-10-CM | POA: Diagnosis not present

## 2021-03-23 DIAGNOSIS — Z8616 Personal history of COVID-19: Secondary | ICD-10-CM

## 2021-03-23 HISTORY — DX: Personal history of COVID-19: Z86.16

## 2021-03-28 DIAGNOSIS — H2513 Age-related nuclear cataract, bilateral: Secondary | ICD-10-CM | POA: Diagnosis not present

## 2021-03-28 DIAGNOSIS — H16223 Keratoconjunctivitis sicca, not specified as Sjogren's, bilateral: Secondary | ICD-10-CM | POA: Diagnosis not present

## 2021-03-28 DIAGNOSIS — H52223 Regular astigmatism, bilateral: Secondary | ICD-10-CM | POA: Diagnosis not present

## 2021-03-28 DIAGNOSIS — H5211 Myopia, right eye: Secondary | ICD-10-CM | POA: Diagnosis not present

## 2021-03-28 DIAGNOSIS — H524 Presbyopia: Secondary | ICD-10-CM | POA: Diagnosis not present

## 2021-03-28 DIAGNOSIS — H43813 Vitreous degeneration, bilateral: Secondary | ICD-10-CM | POA: Diagnosis not present

## 2021-03-28 DIAGNOSIS — H18453 Nodular corneal degeneration, bilateral: Secondary | ICD-10-CM | POA: Diagnosis not present

## 2021-03-29 DIAGNOSIS — N950 Postmenopausal bleeding: Secondary | ICD-10-CM | POA: Diagnosis not present

## 2021-03-29 DIAGNOSIS — N930 Postcoital and contact bleeding: Secondary | ICD-10-CM | POA: Diagnosis not present

## 2021-03-29 DIAGNOSIS — Z789 Other specified health status: Secondary | ICD-10-CM | POA: Diagnosis not present

## 2021-04-04 DIAGNOSIS — M9901 Segmental and somatic dysfunction of cervical region: Secondary | ICD-10-CM | POA: Diagnosis not present

## 2021-04-04 DIAGNOSIS — M5411 Radiculopathy, occipito-atlanto-axial region: Secondary | ICD-10-CM | POA: Diagnosis not present

## 2021-04-25 DIAGNOSIS — M5411 Radiculopathy, occipito-atlanto-axial region: Secondary | ICD-10-CM | POA: Diagnosis not present

## 2021-04-25 DIAGNOSIS — M9901 Segmental and somatic dysfunction of cervical region: Secondary | ICD-10-CM | POA: Diagnosis not present

## 2021-05-09 DIAGNOSIS — R7301 Impaired fasting glucose: Secondary | ICD-10-CM | POA: Diagnosis not present

## 2021-05-09 DIAGNOSIS — E669 Obesity, unspecified: Secondary | ICD-10-CM | POA: Diagnosis not present

## 2021-05-09 DIAGNOSIS — N183 Chronic kidney disease, stage 3 unspecified: Secondary | ICD-10-CM | POA: Diagnosis not present

## 2021-05-09 DIAGNOSIS — Z6831 Body mass index (BMI) 31.0-31.9, adult: Secondary | ICD-10-CM | POA: Diagnosis not present

## 2021-05-09 DIAGNOSIS — R7989 Other specified abnormal findings of blood chemistry: Secondary | ICD-10-CM | POA: Diagnosis not present

## 2021-05-09 DIAGNOSIS — Z01810 Encounter for preprocedural cardiovascular examination: Secondary | ICD-10-CM | POA: Diagnosis not present

## 2021-05-09 DIAGNOSIS — E785 Hyperlipidemia, unspecified: Secondary | ICD-10-CM | POA: Diagnosis not present

## 2021-05-09 DIAGNOSIS — F419 Anxiety disorder, unspecified: Secondary | ICD-10-CM | POA: Diagnosis not present

## 2021-05-11 DIAGNOSIS — M9901 Segmental and somatic dysfunction of cervical region: Secondary | ICD-10-CM | POA: Diagnosis not present

## 2021-05-11 DIAGNOSIS — M5411 Radiculopathy, occipito-atlanto-axial region: Secondary | ICD-10-CM | POA: Diagnosis not present

## 2021-05-23 DIAGNOSIS — M9901 Segmental and somatic dysfunction of cervical region: Secondary | ICD-10-CM | POA: Diagnosis not present

## 2021-05-23 DIAGNOSIS — M5411 Radiculopathy, occipito-atlanto-axial region: Secondary | ICD-10-CM | POA: Diagnosis not present

## 2021-05-25 ENCOUNTER — Encounter: Payer: Self-pay | Admitting: Family Medicine

## 2021-05-25 DIAGNOSIS — E785 Hyperlipidemia, unspecified: Secondary | ICD-10-CM | POA: Diagnosis not present

## 2021-05-25 DIAGNOSIS — N1831 Chronic kidney disease, stage 3a: Secondary | ICD-10-CM | POA: Diagnosis not present

## 2021-05-25 DIAGNOSIS — E669 Obesity, unspecified: Secondary | ICD-10-CM | POA: Diagnosis not present

## 2021-05-25 DIAGNOSIS — R7301 Impaired fasting glucose: Secondary | ICD-10-CM | POA: Diagnosis not present

## 2021-06-06 DIAGNOSIS — M5411 Radiculopathy, occipito-atlanto-axial region: Secondary | ICD-10-CM | POA: Diagnosis not present

## 2021-06-06 DIAGNOSIS — M9901 Segmental and somatic dysfunction of cervical region: Secondary | ICD-10-CM | POA: Diagnosis not present

## 2021-06-09 ENCOUNTER — Other Ambulatory Visit: Payer: Self-pay | Admitting: Family Medicine

## 2021-06-09 ENCOUNTER — Encounter: Payer: Self-pay | Admitting: *Deleted

## 2021-06-09 NOTE — Telephone Encounter (Signed)
Patient notified that she need to schedule office visit first.

## 2021-06-09 NOTE — Telephone Encounter (Signed)
Last July encouraged 6 month follow up- I am willing to refill this but want her at least scheduled for her next visit first. Thanks! Also why am I not listed as pcp? Please update

## 2021-06-09 NOTE — Telephone Encounter (Signed)
LAST APPOINTMENT DATE: Visit date not found   NEXT APPOINTMENT DATE: Visit date not found    LAST REFILL:11/29/2020  QTY:45  Ref 3

## 2021-06-12 NOTE — Telephone Encounter (Signed)
Please get patient scheduled. Thanks

## 2021-06-13 ENCOUNTER — Encounter (HOSPITAL_BASED_OUTPATIENT_CLINIC_OR_DEPARTMENT_OTHER): Payer: Self-pay | Admitting: Obstetrics and Gynecology

## 2021-06-13 DIAGNOSIS — M7061 Trochanteric bursitis, right hip: Secondary | ICD-10-CM | POA: Diagnosis not present

## 2021-06-13 NOTE — Telephone Encounter (Signed)
Left VM for pt to call our office to schedule an appt

## 2021-06-14 ENCOUNTER — Other Ambulatory Visit: Payer: Self-pay

## 2021-06-14 ENCOUNTER — Encounter (HOSPITAL_BASED_OUTPATIENT_CLINIC_OR_DEPARTMENT_OTHER): Payer: Self-pay | Admitting: Obstetrics and Gynecology

## 2021-06-14 DIAGNOSIS — N950 Postmenopausal bleeding: Secondary | ICD-10-CM

## 2021-06-14 HISTORY — DX: Postmenopausal bleeding: N95.0

## 2021-06-14 NOTE — Progress Notes (Addendum)
Spoke w/ via phone for pre-op interview---pt Lab needs dos---- I stat              Lab results------ekg 05-09-2021 [facility] on chart, lov catherine miller pharm d 05-25-2021 on chart COVID test -----patient states asymptomatic no test needed Arrive at -------830 am 06-16-2021 NPO after MN NO Solid Food.  Clear liquids from MN until---730 am  Med rec completed Medications to take morning of surgery -----synthroid, paxil, prilosec, biest Diabetic medication -----none Patient instructed no nail polish to be worn day of surgery Patient instructed to bring photo id and insurance card day of surgery Patient aware to have Driver (ride ) / caregiver  tom mckinight husband will stay  for 24 hours after surgery  Patient Special Instructions ----- req surgery orders dr Lennox Solders epic ib Pre-Op special Istructions -----none Patient verbalized understanding of instructions that were given at this phone interview. Patient denies shortness of breath, chest pain, fever, cough at this phone interview.

## 2021-06-16 ENCOUNTER — Encounter (HOSPITAL_BASED_OUTPATIENT_CLINIC_OR_DEPARTMENT_OTHER): Payer: Self-pay | Admitting: Obstetrics and Gynecology

## 2021-06-16 ENCOUNTER — Ambulatory Visit (HOSPITAL_BASED_OUTPATIENT_CLINIC_OR_DEPARTMENT_OTHER): Payer: Medicare Other | Admitting: Anesthesiology

## 2021-06-16 ENCOUNTER — Other Ambulatory Visit: Payer: Self-pay

## 2021-06-16 ENCOUNTER — Ambulatory Visit (HOSPITAL_BASED_OUTPATIENT_CLINIC_OR_DEPARTMENT_OTHER)
Admission: RE | Admit: 2021-06-16 | Discharge: 2021-06-16 | Disposition: A | Discharge status: Home / Self Care | Payer: Medicare Other | Attending: Obstetrics and Gynecology | Admitting: Obstetrics and Gynecology

## 2021-06-16 ENCOUNTER — Encounter (HOSPITAL_BASED_OUTPATIENT_CLINIC_OR_DEPARTMENT_OTHER)
Admission: RE | Disposition: A | Discharge status: Home / Self Care | Payer: Self-pay | Source: Home / Self Care | Attending: Obstetrics and Gynecology

## 2021-06-16 DIAGNOSIS — Z8616 Personal history of COVID-19: Secondary | ICD-10-CM | POA: Diagnosis not present

## 2021-06-16 DIAGNOSIS — N840 Polyp of corpus uteri: Secondary | ICD-10-CM | POA: Insufficient documentation

## 2021-06-16 DIAGNOSIS — N183 Chronic kidney disease, stage 3 unspecified: Secondary | ICD-10-CM | POA: Diagnosis not present

## 2021-06-16 DIAGNOSIS — N8500 Endometrial hyperplasia, unspecified: Secondary | ICD-10-CM | POA: Diagnosis not present

## 2021-06-16 DIAGNOSIS — Z79890 Hormone replacement therapy (postmenopausal): Secondary | ICD-10-CM | POA: Diagnosis not present

## 2021-06-16 DIAGNOSIS — N950 Postmenopausal bleeding: Secondary | ICD-10-CM | POA: Diagnosis not present

## 2021-06-16 DIAGNOSIS — Z79899 Other long term (current) drug therapy: Secondary | ICD-10-CM | POA: Diagnosis not present

## 2021-06-16 DIAGNOSIS — E039 Hypothyroidism, unspecified: Secondary | ICD-10-CM | POA: Insufficient documentation

## 2021-06-16 DIAGNOSIS — Z01818 Encounter for other preprocedural examination: Secondary | ICD-10-CM

## 2021-06-16 DIAGNOSIS — K219 Gastro-esophageal reflux disease without esophagitis: Secondary | ICD-10-CM | POA: Insufficient documentation

## 2021-06-16 DIAGNOSIS — N858 Other specified noninflammatory disorders of uterus: Secondary | ICD-10-CM | POA: Diagnosis not present

## 2021-06-16 DIAGNOSIS — R9389 Abnormal findings on diagnostic imaging of other specified body structures: Secondary | ICD-10-CM | POA: Diagnosis not present

## 2021-06-16 DIAGNOSIS — Z791 Long term (current) use of non-steroidal anti-inflammatories (NSAID): Secondary | ICD-10-CM | POA: Diagnosis not present

## 2021-06-16 HISTORY — DX: Chronic kidney disease, unspecified: N18.9

## 2021-06-16 HISTORY — DX: Presence of dental prosthetic device (complete) (partial): Z97.2

## 2021-06-16 HISTORY — DX: Prediabetes: R73.03

## 2021-06-16 HISTORY — DX: Other bursitis of hip, right hip: M70.71

## 2021-06-16 HISTORY — PX: DILATATION & CURETTAGE/HYSTEROSCOPY WITH MYOSURE: SHX6511

## 2021-06-16 HISTORY — DX: Low back pain, unspecified: M54.50

## 2021-06-16 LAB — POCT I-STAT, CHEM 8
BUN: 16 mg/dL (ref 8–23)
Calcium, Ion: 1.16 mmol/L (ref 1.15–1.40)
Chloride: 106 mmol/L (ref 98–111)
Creatinine, Ser: 1 mg/dL (ref 0.44–1.00)
Glucose, Bld: 103 mg/dL — ABNORMAL HIGH (ref 70–99)
HCT: 36 % (ref 36.0–46.0)
Hemoglobin: 12.2 g/dL (ref 12.0–15.0)
Potassium: 3.8 mmol/L (ref 3.5–5.1)
Sodium: 139 mmol/L (ref 135–145)
TCO2: 24 mmol/L (ref 22–32)

## 2021-06-16 LAB — TYPE AND SCREEN
ABO/RH(D): A POS
Antibody Screen: NEGATIVE

## 2021-06-16 LAB — ABO/RH: ABO/RH(D): A POS

## 2021-06-16 SURGERY — DILATATION & CURETTAGE/HYSTEROSCOPY WITH MYOSURE
Anesthesia: General | Site: Uterus

## 2021-06-16 MED ORDER — LACTATED RINGERS IV SOLN: INTRAVENOUS | Status: DC

## 2021-06-16 MED ORDER — FENTANYL CITRATE (PF) 100 MCG/2ML IJ SOLN
INTRAMUSCULAR | Status: DC | PRN
  Administered 2021-06-16 (×2): 25 ug via INTRAVENOUS
  Administered 2021-06-16: 50 ug via INTRAVENOUS

## 2021-06-16 MED ORDER — ONDANSETRON HCL 4 MG/2ML IJ SOLN
INTRAMUSCULAR | Status: AC
  Filled 2021-06-16: qty 2

## 2021-06-16 MED ORDER — ONDANSETRON HCL 4 MG/2ML IJ SOLN
INTRAMUSCULAR | Status: DC | PRN
  Administered 2021-06-16: 4 mg via INTRAVENOUS

## 2021-06-16 MED ORDER — SODIUM CHLORIDE 0.9 % IR SOLN
Status: DC | PRN
  Administered 2021-06-16: 3000 mL

## 2021-06-16 MED ORDER — OXYCODONE HCL 5 MG/5ML PO SOLN: 5.0000 mg | Freq: Once | ORAL | Status: DC | PRN

## 2021-06-16 MED ORDER — PROPOFOL 10 MG/ML IV BOLUS
INTRAVENOUS | Status: DC | PRN
Start: 2021-06-16 — End: 2021-06-16
  Administered 2021-06-16: 150 mg via INTRAVENOUS
  Administered 2021-06-16: 50 mg via INTRAVENOUS

## 2021-06-16 MED ORDER — OXYCODONE HCL 5 MG PO TABS: 5.0000 mg | ORAL_TABLET | Freq: Once | ORAL | Status: DC | PRN

## 2021-06-16 MED ORDER — LIDOCAINE HCL 1 % IJ SOLN
INTRAMUSCULAR | Status: DC | PRN
  Administered 2021-06-16: 10 mL

## 2021-06-16 MED ORDER — DROPERIDOL 2.5 MG/ML IJ SOLN
INTRAMUSCULAR | Status: DC | PRN
  Administered 2021-06-16: .625 mg via INTRAVENOUS

## 2021-06-16 MED ORDER — DEXAMETHASONE SODIUM PHOSPHATE 10 MG/ML IJ SOLN
INTRAMUSCULAR | Status: DC | PRN
  Administered 2021-06-16: 5 mg via INTRAVENOUS

## 2021-06-16 MED ORDER — IBUPROFEN 400 MG PO TABS
400.0000 mg | ORAL_TABLET | Freq: Four times a day (QID) | ORAL | 0 refills | Status: DC | PRN
Start: 2021-06-16 — End: 2023-01-29

## 2021-06-16 MED ORDER — ONDANSETRON HCL 4 MG/2ML IJ SOLN: 4.0000 mg | Freq: Once | INTRAMUSCULAR | Status: DC | PRN

## 2021-06-16 MED ORDER — FENTANYL CITRATE (PF) 100 MCG/2ML IJ SOLN
INTRAMUSCULAR | Status: AC
  Filled 2021-06-16: qty 2

## 2021-06-16 MED ORDER — PROPOFOL 10 MG/ML IV BOLUS
INTRAVENOUS | Status: AC
Start: 2021-06-16 — End: ?
  Filled 2021-06-16: qty 20

## 2021-06-16 MED ORDER — POVIDONE-IODINE 10 % EX SWAB: 2.0000 "application " | Freq: Once | CUTANEOUS | Status: DC

## 2021-06-16 MED ORDER — LIDOCAINE HCL (PF) 2 % IJ SOLN
INTRAMUSCULAR | Status: AC
  Filled 2021-06-16: qty 5

## 2021-06-16 MED ORDER — ACETAMINOPHEN 10 MG/ML IV SOLN: 1000.0000 mg | Freq: Once | INTRAVENOUS | Status: DC | PRN

## 2021-06-16 MED ORDER — SODIUM CHLORIDE 0.9 % IV SOLN: INTRAVENOUS | Status: DC

## 2021-06-16 MED ORDER — DEXAMETHASONE SODIUM PHOSPHATE 10 MG/ML IJ SOLN
INTRAMUSCULAR | Status: AC
  Filled 2021-06-16: qty 1

## 2021-06-16 MED ORDER — ONDANSETRON 4 MG PO TBDP
4.0000 mg | ORAL_TABLET | Freq: Three times a day (TID) | ORAL | 3 refills | Status: DC | PRN
Start: 2021-06-16 — End: 2021-10-02

## 2021-06-16 MED ORDER — LIDOCAINE 2% (20 MG/ML) 5 ML SYRINGE
INTRAMUSCULAR | Status: DC | PRN
  Administered 2021-06-16: 60 mg via INTRAVENOUS

## 2021-06-16 MED ORDER — FENTANYL CITRATE (PF) 100 MCG/2ML IJ SOLN: 25.0000 ug | INTRAMUSCULAR | Status: DC | PRN

## 2021-06-16 SURGICAL SUPPLY — 15 items
CATH ROBINSON RED A/P 16FR (CATHETERS) ×2 IMPLANT
DEVICE MYOSURE LITE (MISCELLANEOUS) IMPLANT
DEVICE MYOSURE REACH (MISCELLANEOUS) ×1 IMPLANT
DRSG TELFA 3X8 NADH (GAUZE/BANDAGES/DRESSINGS) ×2 IMPLANT
GAUZE 4X4 16PLY ~~LOC~~+RFID DBL (SPONGE) ×2 IMPLANT
GLOVE SURG LTX SZ6.5 (GLOVE) ×2 IMPLANT
GLOVE SURG UNDER POLY LF SZ6.5 (GLOVE) ×2 IMPLANT
GOWN STRL REUS W/TWL LRG LVL3 (GOWN DISPOSABLE) ×4 IMPLANT
KIT PROCEDURE FLUENT (KITS) ×2 IMPLANT
KIT TURNOVER CYSTO (KITS) ×2 IMPLANT
PACK VAGINAL MINOR WOMEN LF (CUSTOM PROCEDURE TRAY) ×2 IMPLANT
PAD DRESSING TELFA 3X8 NADH (GAUZE/BANDAGES/DRESSINGS) ×1 IMPLANT
PAD OB MATERNITY 4.3X12.25 (PERSONAL CARE ITEMS) ×2 IMPLANT
SEAL ROD LENS SCOPE MYOSURE (ABLATOR) ×2 IMPLANT
TOWEL OR 17X26 10 PK STRL BLUE (TOWEL DISPOSABLE) ×2 IMPLANT

## 2021-06-16 NOTE — Discharge Instructions (Addendum)
DISCHARGE INSTRUCTIONS: HYSTEROSCOPY / ENDOMETRIAL ABLATION The following instructions have been prepared to help you care for yourself upon your return home.  Personal hygiene:  Use sanitary pads for vaginal drainage, not tampons.  Shower the day after your procedure.  NO tub baths, pools or Jacuzzis for 2-3 weeks.  Wipe front to back after using the bathroom.  Activity and limitations:  Do NOT drive or operate any equipment for 24 hours. The effects of anesthesia are still present and drowsiness may result.  Do NOT rest in bed all day.  Walking is encouraged.  Walk up and down stairs slowly.  You may resume your normal activity in one to two days or as indicated by your physician. Sexual activity: NO intercourse for at least 2 weeks after the procedure, or as indicated by your Doctor.  Diet: Eat a light meal as desired this evening. You may resume your usual diet tomorrow.  Return to Work: You may resume your work activities in one to two days or as indicated by Marine scientist.  What to expect after your surgery: Expect to have vaginal bleeding/discharge for 2-3 days and spotting for up to 10 days. It is not unusual to have soreness for up to 1-2 weeks. You may have a slight burning sensation when you urinate for the first day. Mild cramps may continue for a couple of days. You may have a regular period in 2-6 weeks.  Call your doctor for any of the following:  Excessive vaginal bleeding or clotting, saturating and changing one pad every hour.  Inability to urinate 6 hours after discharge from hospital.  Pain not relieved by pain medication.  Fever of 100.4 F or greater.  Unusual vaginal discharge or odor.      Post Anesthesia Home Care Instructions  Activity: Get plenty of rest for the remainder of the day. A responsible individual must stay with you for 24 hours following the procedure.  For the next 24 hours, DO NOT: -Drive a car -Paediatric nurse -Drink alcoholic  beverages -Take any medication unless instructed by your physician -Make any legal decisions or sign important papers.  Meals: Start with liquid foods such as gelatin or soup. Progress to regular foods as tolerated. Avoid greasy, spicy, heavy foods. If nausea and/or vomiting occur, drink only clear liquids until the nausea and/or vomiting subsides. Call your physician if vomiting continues.  Special Instructions/Symptoms: Your throat may feel dry or sore from the anesthesia or the breathing tube placed in your throat during surgery. If this causes discomfort, gargle with warm salt water. The discomfort should disappear within 24 hours.

## 2021-06-16 NOTE — Anesthesia Postprocedure Evaluation (Signed)
Anesthesia Post Note  Patient: Megan Booth  Procedure(s) Performed: DILATATION & CURETTAGE/HYSTEROSCOPY WITH MYOSURE (Uterus)     Patient location during evaluation: PACU Anesthesia Type: General Level of consciousness: awake and alert Pain management: pain level controlled Vital Signs Assessment: post-procedure vital signs reviewed and stable Respiratory status: spontaneous breathing, nonlabored ventilation, respiratory function stable and patient connected to nasal cannula oxygen Cardiovascular status: blood pressure returned to baseline and stable Postop Assessment: no apparent nausea or vomiting Anesthetic complications: no   No notable events documented.  Last Vitals:  Vitals:   06/16/21 1132 06/16/21 1145  BP: (!) 143/80 124/67  Pulse: 69 67  Resp: 11 12  Temp: 36.8 C   SpO2: 98% 98%    Last Pain:  Vitals:   06/16/21 1132  TempSrc:   PainSc: 0-No pain                 Mayara Paulson S

## 2021-06-16 NOTE — Transfer of Care (Signed)
Immediate Anesthesia Transfer of Care Note  Patient: Megan Booth  Procedure(s) Performed: DILATATION & CURETTAGE/HYSTEROSCOPY WITH MYOSURE (Uterus)  Patient Location: PACU  Anesthesia Type:General  Level of Consciousness: awake, alert , oriented and patient cooperative  Airway & Oxygen Therapy: Patient Spontanous Breathing and Patient connected to nasal cannula oxygen  Post-op Assessment: Report given to RN and Post -op Vital signs reviewed and stable  Post vital signs: Reviewed and stable  Last Vitals:  Vitals Value Taken Time  BP 143/80 06/16/21 1132  Temp    Pulse 65 06/16/21 1133  Resp 13 06/16/21 1133  SpO2 98 % 06/16/21 1133  Vitals shown include unvalidated device data.  Last Pain:  Vitals:   06/16/21 0901  TempSrc: Oral  PainSc: 0-No pain      Patients Stated Pain Goal: 3 (83/72/90 2111)  Complications: No notable events documented.

## 2021-06-16 NOTE — Op Note (Signed)
06/16/21   Surgeon: Eula Flax, MD Preoperative Diagnoses: Postmenopausal bleeding in setting of thickened endometrial lining Postoperative Diagnoses: endometrial polyp   Procedures performed:  1) Hysteroscopy dilation and curettage 2) Myosure resection of endometrial polyp   IVF: 500cc LR EBL: <10cc UOP: voided prior to procedure Fluid deficit: 125cc   Anesthesia: LMA  Findings: External genitalia WNL. Mild vaginal atrophy c/w PMF. BL adnexa WNL. Upon insertion of Myosure scope, polpyoid projections at 4, 12 and 8 o clock. BL global view easily noted. Otherwise atrophic appearing endometrial lining. Sounded to 8cm.    Indications: Ms Henthorn is a 76yo G0P0 with PMB. Declined in office EMBx, TVUS shows thickened EMS with cystic areas. Pt desires surgical management of PMB. Consented for above procedures   RBA:  The patient was informed of the risks and benefits of a hysteroscopy with dilation and curettage. Risks included but were not limited to bleeidng, infections, injury to the vulva, vagina or cerivx, or uterine perforation. If concern for latter, may proceed with diagnostic laparoscopy for evaluation of injury which may require surgical repair. Patient understands and is amenable.      Operative details: Patient was taken to operating room where general anesthesia via LMA was established. Placed in dorsal lithotomy with appropriate padded points. No antibiotics given preop via ACOG recommendations. Time out was performed after vaginal prep was carried out with Betadine. Tenaculum then placed on anterior cervical lip. Pratt dilators used (21) to dilate internal os. Gentle traction used to dilate internal os. Upon insertion of Myosure hysteroscope, findings noted as above. Myosure resection carried out until visible pahtology was removed. Scope removed, D&C carried out in gentle fashion clockwise until gritty texture noted throughout. Tenaculum removed. Bleeding controlled with  pressure. All instruments removed from vagina.   Fluid deficit controlled throughout, final deficit as above  Patient tolerated procedure well. All counts correct at end of procedure

## 2021-06-16 NOTE — H&P (Signed)
Megan Booth is an 76 y.o. female presenting for scheduled procedure  Pertinent Gynecological History: Menses: post-menopausal Bleeding: post menopausal bleeding Contraception: post menopausal status DES exposure: denies Blood transfusions: none Sexually transmitted diseases: no past history Previous GYN Procedures:  endometrial ablation   Last mammogram: normal Date: 02/16/21 Last pap: normal Date: 09/11/11 OB History: G0, P0   Menstrual History: Menarche age: early teens No LMP recorded. Patient is postmenopausal.    Past Medical History:  Diagnosis Date   Anxiety    Barrett esophagus    Bursitis of right hip    injection with dr Cay Schillings 06-13-2021   Cancer [facility])    left wrist skin cancer   Chronic kidney disease Stage 3    Diverticulosis    GERD (gastroesophageal reflux disease)    history of Amnesia, global, transient 2015   see hospital notes   history of right foot Tendonitis 07/15/2019   wears shoe inserts   Hx of colonic polyps    Dr. Lajoyce Corners    Hyperlipidemia    Hypothyroidism    Leukoplakia, vulva    Lower back pain    Personal history of COVID-19 03/2021   mild symptoms took paxlovid all symptoms resolved   PMB (postmenopausal bleeding) 06/14/2021   Pre-diabetes    Sleep apnea    could not tolerate cpap mild osa per sleep study 5 -6 yrs ago per pt 06-14-2021   Wears partial denture lower     Past Surgical History:  Procedure Laterality Date   BREAST ENHANCEMENT SURGERY Bilateral    40 yrs ago   colonscopy  06/24/2019   DILATION AND CURETTAGE OF UTERUS     x 2 last 2020   HIATAL HERNIA REPAIR N/A 08/18/2019   Procedure: LAPAROSCOPIC PARAESOPHAGEAL HERNIA REPAIR WITH FUNDOPLICATION;  Surgeon: Mickeal Skinner, MD;  Location: [facility];  Service: General;  Laterality: N/A;   HYSTEROSCOPY     last done 2 yrs ago   LASIK Bilateral    yrs ago   left wrist skin cancer area removed      5-6 yrs ago per pt on 06-14-2021   TUBAL LIGATION     yrs ago    UPPER GI ENDOSCOPY  06/24/2019    Family History  Problem Relation Age of Onset   Heart disease Mother        pacemaker   Hypertension Mother    Depression Mother    Diabetes Mother    Dementia Mother    Cancer Father 26       lung, smoker   Seizures Brother    Colon cancer Neg Hx    Esophageal cancer Neg Hx    Stomach cancer Neg Hx     Social History:  reports that she has never smoked. She has never used smokeless tobacco. She reports that she does not currently use alcohol after a past usage of about 1.0 standard drink per week. She reports that she does not use drugs.  Allergies: No Known Allergies  Medications Prior to Admission  Medication Sig Dispense Refill Last Dose   Artificial Tear Ointment (DRY EYES OP) Place 1 drop into both eyes daily as needed (Dry eye).   06/16/2021 at 0700   atorvastatin (LIPITOR) 40 MG tablet TAKE 1 TABLET ONCE DAILY AT 6 PM. (Patient taking differently: daily.) 90 tablet 3 06/16/2021 at 0715   Estradiol-Estriol-Progesterone (BIEST/PROGESTERONE TD) Take 1 capsule by mouth daily.    06/15/2021 at 0900   famotidine (PEPCID) 20 MG  tablet Take 20 mg by mouth at bedtime.   06/15/2021   furosemide (LASIX) 20 MG tablet Take 1 tablet (20 mg total) by mouth daily as needed for fluid. 90 tablet 3 Past Month   ibuprofen (ADVIL) 200 MG tablet Take 200 mg by mouth every 6 (six) hours as needed. Took 2 06/15/2021.   06/15/2021   levothyroxine (SYNTHROID) 25 MCG tablet Take 1 tablet (25 mcg total) by mouth daily. 90 tablet 3 06/16/2021 at 0715   nystatin-triamcinolone (MYCOLOG II) cream Apply 1 application topically daily as needed (female lesions).    Past Week   omeprazole (PRILOSEC) 40 MG capsule TAKE ONE CAPSULE DAILY BEFORE LUNCH TIME MEAL. (Patient taking differently: daily.) 30 capsule 11 06/16/2021 at 0715   PARoxetine (PAXIL) 10 MG tablet Take 1 tablet (10 mg total) by mouth daily. 90 tablet 3 06/16/2021 at 0715   Semaglutide (OZEMPIC, 1 MG/DOSE, [place]) Inject into  the skin once a week. On wednesday   06/14/2021   zolpidem (AMBIEN) 10 MG tablet TAKE 1/2 TO 1 TABLET AT BEDTIME AS NEEDED FOR SLEEP. 45 tablet 3 06/15/2021   acetaminophen (TYLENOL) 500 MG tablet Take 500-1,000 mg by mouth every 6 (six) hours as needed for mild pain or headache.    06/13/2021   Cholecalciferol (VITAMIN D3) 1000 units CAPS Take 1,000 Units by mouth daily.  (Patient not taking: Reported on 06/14/2021)   Not Taking    Review of Systems  Constitutional:  Negative for chills and fever.  Respiratory:  Negative for shortness of breath.   Cardiovascular:  Negative for chest pain, palpitations and leg swelling.  Gastrointestinal:  Negative for abdominal pain, nausea and vomiting.  Neurological:  Negative for dizziness, weakness and headaches.  Psychiatric/Behavioral:  Negative for suicidal ideas.    Blood pressure (!) 147/90, pulse 91, temperature 97.6 F (36.4 C), temperature source Oral, resp. rate 16, height 5\' 2"  (1.575 m), weight 78.4 kg, SpO2 98 %. Physical Exam *Constitutional General Appearance: healthy-appearing, well-nourished  *Cardiovascular Auscultation: RRR, no murmur Peripheral Vascular: no edema, pedal pulses intact  *Respiratory Auscultation: clear to auscultation, no wheezing, no rales/crackles, no rhonchi Inspection: normal, normal respiratory rate  *Gastrointestinal Inspection/Palpation/Auscultation: non-distended, no tenderness, no rebound, no guarding, soft, normal bowel sounds  *Genitourinary - deferred  *Musculoskeletal Legs: no palpable cords, no calf tenderness  *Skin Appearance: no rashes, no lesions  *Neurological System Impressions: motor: no deficits, sensory: no deficits  *Psychiatric Mood and Affect: active and alert  Results for orders placed or performed during the hospital encounter of 06/16/21 (from the past 24 hour(s))  I-STAT, chem 8     Status: Abnormal   Collection Time: 06/16/21  9:24 AM  Result Value Ref Range   Sodium  139 135 - 145 mmol/L   Potassium 3.8 3.5 - 5.1 mmol/L   Chloride 106 98 - 111 mmol/L   BUN 16 8 - 23 mg/dL   Creatinine, Ser 1.00 0.44 - 1.00 mg/dL   Glucose, Bld 103 (H) 70 - 99 mg/dL   Calcium, Ion 1.16 1.15 - 1.40 mmol/L   TCO2 24 22 - 32 mmol/L   Hemoglobin 12.2 12.0 - 15.0 g/dL   HCT 36.0 36.0 - 46.0 %    No results found.  Assessment/Plan: 76yo G0 PMF here for surgical mgmt of PMB. Prior episode in 2020 treated with D&C/Novasure ablation.   -TVUS findings 12/7: Cystic areas within endometrium measuring 4.80mm, irr c/w prior ablation, uterus 6-7cm with increased vascularity and somewhat globular. Declined in-office EMBx  -  Counseled on hysteroscopy D&C  The patient was informed of the risks and benefits of a hysteroscopy with dilation and curettage. Risks included but were not limited to bleeidng, infections, injury to the vulva, vagina or cerivx, or uterine perforation. If concern for latter, may proceed with diagnostic laparoscopy for evaluation of injury which may require surgical repair. Patient understands and is amenable.   [place] 06/16/2021, 10:12 AM

## 2021-06-16 NOTE — Anesthesia Procedure Notes (Signed)
Procedure Name: LMA Insertion Date/Time: 06/16/2021 10:52 AM Performed by: Lollie Sails, CRNA Pre-anesthesia Checklist: Patient identified, Emergency Drugs available, Suction available, Patient being monitored and Timeout performed Patient Re-evaluated:Patient Re-evaluated prior to induction Oxygen Delivery Method: Circle system utilized Preoxygenation: Pre-oxygenation with 100% oxygen Induction Type: IV induction Ventilation: Mask ventilation without difficulty LMA: LMA inserted LMA Size: 4.0 Number of attempts: 1 Placement Confirmation: positive ETCO2 and breath sounds checked- equal and bilateral Tube secured with: Tape Dental Injury: Teeth and Oropharynx as per pre-operative assessment

## 2021-06-16 NOTE — Anesthesia Preprocedure Evaluation (Signed)
Anesthesia Evaluation  Patient identified by MRN, date of birth, ID band Patient awake    Reviewed: Allergy & Precautions, NPO status , Patient's Chart, lab work & pertinent test results  Airway Mallampati: II  TM Distance: >3 FB Neck ROM: Full    Dental no notable dental hx.    Pulmonary neg pulmonary ROS,    Pulmonary exam normal breath sounds clear to auscultation       Cardiovascular negative cardio ROS Normal cardiovascular exam Rhythm:Regular Rate:Normal     Neuro/Psych negative neurological ROS  negative psych ROS   GI/Hepatic Neg liver ROS, GERD  Medicated,  Endo/Other  Hypothyroidism   Renal/GU Renal InsufficiencyRenal disease  negative genitourinary   Musculoskeletal negative musculoskeletal ROS (+)   Abdominal   Peds negative pediatric ROS (+)  Hematology negative hematology ROS (+)   Anesthesia Other Findings   Reproductive/Obstetrics negative OB ROS                             Anesthesia Physical Anesthesia Plan  ASA: 2  Anesthesia Plan: General   Post-op Pain Management: Minimal or no pain anticipated   Induction: Intravenous  PONV Risk Score and Plan: 3 and Ondansetron, Dexamethasone and Treatment may vary due to age or medical condition  Airway Management Planned: LMA  Additional Equipment:   Intra-op Plan:   Post-operative Plan: Extubation in OR  Informed Consent: I have reviewed the patients History and Physical, chart, labs and discussed the procedure including the risks, benefits and alternatives for the proposed anesthesia with the patient or authorized representative who has indicated his/her understanding and acceptance.     Dental advisory given  Plan Discussed with: CRNA and Surgeon  Anesthesia Plan Comments:         Anesthesia Quick Evaluation

## 2021-06-19 ENCOUNTER — Encounter (HOSPITAL_BASED_OUTPATIENT_CLINIC_OR_DEPARTMENT_OTHER): Payer: Self-pay | Admitting: Obstetrics and Gynecology

## 2021-06-19 LAB — SURGICAL PATHOLOGY

## 2021-06-25 ENCOUNTER — Other Ambulatory Visit: Payer: Self-pay | Admitting: Gastroenterology

## 2021-06-26 ENCOUNTER — Encounter: Payer: Self-pay | Admitting: *Deleted

## 2021-07-04 DIAGNOSIS — M5411 Radiculopathy, occipito-atlanto-axial region: Secondary | ICD-10-CM | POA: Diagnosis not present

## 2021-07-04 DIAGNOSIS — M9901 Segmental and somatic dysfunction of cervical region: Secondary | ICD-10-CM | POA: Diagnosis not present

## 2021-08-28 ENCOUNTER — Telehealth: Payer: Self-pay | Admitting: Nurse Practitioner

## 2021-08-28 ENCOUNTER — Ambulatory Visit: Payer: Medicare Other | Admitting: Nurse Practitioner

## 2021-08-28 MED ORDER — OMEPRAZOLE 40 MG PO CPDR: DELAYED_RELEASE_CAPSULE | ORAL | 0 refills | Status: DC

## 2021-08-28 NOTE — Telephone Encounter (Signed)
Inbound call from patient stating that she needed to reschedule her appointment for today at 2:30 but she was out of her medication and wanted to see if she could get a small refill until her rescheduled appointment date 5/25 at 10:00. Please advise.  ?

## 2021-08-28 NOTE — Telephone Encounter (Signed)
Good Morning Megan Booth, ? ?Patient called stating she needed to reschedule her appointment with you today at 2:30 due to having COVID.  ? ?Patient was rescheduled for 5/25 at 10:00.  ?

## 2021-08-28 NOTE — Telephone Encounter (Signed)
30 day supply sent

## 2021-08-31 DIAGNOSIS — E538 Deficiency of other specified B group vitamins: Secondary | ICD-10-CM | POA: Diagnosis not present

## 2021-09-05 DIAGNOSIS — E785 Hyperlipidemia, unspecified: Secondary | ICD-10-CM | POA: Diagnosis not present

## 2021-09-05 DIAGNOSIS — E039 Hypothyroidism, unspecified: Secondary | ICD-10-CM | POA: Diagnosis not present

## 2021-09-05 DIAGNOSIS — R7301 Impaired fasting glucose: Secondary | ICD-10-CM | POA: Diagnosis not present

## 2021-09-07 DIAGNOSIS — N183 Chronic kidney disease, stage 3 unspecified: Secondary | ICD-10-CM | POA: Diagnosis not present

## 2021-09-07 DIAGNOSIS — Z0000 Encounter for general adult medical examination without abnormal findings: Secondary | ICD-10-CM | POA: Diagnosis not present

## 2021-09-07 DIAGNOSIS — Z1331 Encounter for screening for depression: Secondary | ICD-10-CM | POA: Diagnosis not present

## 2021-09-07 DIAGNOSIS — Z1339 Encounter for screening examination for other mental health and behavioral disorders: Secondary | ICD-10-CM | POA: Diagnosis not present

## 2021-09-07 DIAGNOSIS — F419 Anxiety disorder, unspecified: Secondary | ICD-10-CM | POA: Diagnosis not present

## 2021-09-07 DIAGNOSIS — G4700 Insomnia, unspecified: Secondary | ICD-10-CM | POA: Diagnosis not present

## 2021-09-07 DIAGNOSIS — M85859 Other specified disorders of bone density and structure, unspecified thigh: Secondary | ICD-10-CM | POA: Diagnosis not present

## 2021-09-07 DIAGNOSIS — Z6829 Body mass index (BMI) 29.0-29.9, adult: Secondary | ICD-10-CM | POA: Diagnosis not present

## 2021-09-07 DIAGNOSIS — R7301 Impaired fasting glucose: Secondary | ICD-10-CM | POA: Diagnosis not present

## 2021-09-07 DIAGNOSIS — E663 Overweight: Secondary | ICD-10-CM | POA: Diagnosis not present

## 2021-09-07 DIAGNOSIS — E039 Hypothyroidism, unspecified: Secondary | ICD-10-CM | POA: Diagnosis not present

## 2021-09-07 DIAGNOSIS — E785 Hyperlipidemia, unspecified: Secondary | ICD-10-CM | POA: Diagnosis not present

## 2021-09-14 ENCOUNTER — Ambulatory Visit: Payer: Medicare Other | Admitting: Nurse Practitioner

## 2021-09-23 ENCOUNTER — Other Ambulatory Visit: Payer: Self-pay | Admitting: Nurse Practitioner

## 2021-10-02 ENCOUNTER — Encounter: Payer: Self-pay | Admitting: Physician Assistant

## 2021-10-02 ENCOUNTER — Ambulatory Visit (INDEPENDENT_AMBULATORY_CARE_PROVIDER_SITE_OTHER): Payer: Medicare Other | Admitting: Physician Assistant

## 2021-10-02 VITALS — BP 120/70 | HR 96 | Ht 62.0 in | Wt 165.0 lb

## 2021-10-02 DIAGNOSIS — K219 Gastro-esophageal reflux disease without esophagitis: Secondary | ICD-10-CM | POA: Diagnosis not present

## 2021-10-02 DIAGNOSIS — Z9889 Other specified postprocedural states: Secondary | ICD-10-CM

## 2021-10-02 DIAGNOSIS — R1319 Other dysphagia: Secondary | ICD-10-CM

## 2021-10-02 DIAGNOSIS — Z86010 Personal history of colonic polyps: Secondary | ICD-10-CM

## 2021-10-02 DIAGNOSIS — Z8719 Personal history of other diseases of the digestive system: Secondary | ICD-10-CM

## 2021-10-02 MED ORDER — OMEPRAZOLE 40 MG PO CPDR: 40.0000 mg | DELAYED_RELEASE_CAPSULE | Freq: Two times a day (BID) | ORAL | 0 refills | Status: DC

## 2021-10-02 NOTE — Patient Instructions (Addendum)
If you are age 74 or older, your body mass index should be between 23-30. Your Body mass index is 30.18 kg/m. If this is out of the aforementioned range listed, please consider follow up with your Primary Care Provider. ________________________________________________________  The [facility] providers would like to encourage you to use [facility] to communicate with providers for non-urgent requests or questions.  Due to long hold times on the telephone, sending your provider a message by [facility] may be a faster and more efficient way to get a response.  Please allow 48 business hours for a response.  Please remember that this is for non-urgent requests.  _______________________________________________________  Dennis Bast have been scheduled for a Barium Esophogram at [facility] (1st floor of the hospital) on 10/10/2021 at 9:30 am. Please arrive 15 minutes prior to your appointment for registration. Make certain not to have anything to eat or drink 3 hours prior to your test. If you need to reschedule for any reason, please contact radiology at [phone] to do so. __________________________________________________________________ A barium swallow is an examination that concentrates on views of the esophagus. This tends to be a double contrast exam (barium and two liquids which, when combined, create a gas to distend the wall of the oesophagus) or single contrast (non-ionic iodine based). The study is usually tailored to your symptoms so a good history is essential. Attention is paid during the study to the form, structure and configuration of the esophagus, looking for functional disorders (such as aspiration, dysphagia, achalasia, motility and reflux) EXAMINATION You may be asked to change into a gown, depending on the type of swallow being performed. A radiologist and radiographer will perform the procedure. The radiologist will advise you of the type of contrast selected for your procedure and  direct you during the exam. You will be asked to stand, sit or lie in several different positions and to hold a small amount of fluid in your mouth before being asked to swallow while the imaging is performed .In some instances you may be asked to swallow barium coated marshmallows to assess the motility of a solid food bolus. The exam can be recorded as a digital or video fluoroscopy procedure. POST PROCEDURE It will take 1-2 days for the barium to pass through your system. To facilitate this, it is important, unless otherwise directed, to increase your fluids for the next 24-48hrs and to resume your normal diet.  This test typically takes about 30 minutes to perform. __________________________________________________________________________________  Refills of Omeprazole 40 mg 1 capsule twice daily have been sent to your pharmacy.  Follow up pending the results of your Barium Swallow.  Thank you for entrusting me with your care and choosing [facility].  Vicie Mutters, PA-C  No aleve, ibuprofen, goody powders, as these are antiinflammatories and can cause inflammation in your stomach, increase bleeding risk and cause ulcers.  You can talk with PCP about alternative pain options.  Can do tyelnol max 3000 mg a day, salon pas patches are over the counter and voltern gel is topical antiinflammatory that is safe.   Please take your proton pump inhibitor medication, omeprazole TAKE TWICE A DAY FOR 30 DAYS , 30 minutes to 1 hour before meals - this makes it more effective.  Avoid spicy and acidic foods Avoid fatty foods Limit your intake of coffee, tea, alcohol, and carbonated drinks Work to maintain a healthy weight Keep the head of the bed elevated at least 3 inches with blocks or a wedge pillow  if you are having any nighttime symptoms Stay upright for 2 hours after eating Avoid meals and snacks three to four hours before bedtime  Gastroesophageal Reflux Disease,  Adult Gastroesophageal reflux (GER) happens when acid from the stomach flows up into the tube that connects the mouth and the stomach (esophagus). Normally, food travels down the esophagus and stays in the stomach to be digested. However, when a person has GER, food and stomach acid sometimes move back up into the esophagus. If this becomes a more serious problem, the person may be diagnosed with a disease called gastroesophageal reflux disease (GERD). GERD occurs when the reflux: Happens often. Causes frequent or severe symptoms. Causes problems such as damage to the esophagus. When stomach acid comes in contact with the esophagus, the acid may cause inflammation in the esophagus. Over time, GERD may create small holes (ulcers) in the lining of the esophagus. What are the causes? This condition is caused by a problem with the muscle between the esophagus and the stomach (lower esophageal sphincter, or LES). Normally, the LES muscle closes after food passes through the esophagus to the stomach. When the LES is weakened or abnormal, it does not close properly, and that allows food and stomach acid to go back up into the esophagus. The LES can be weakened by certain dietary substances, medicines, and medical conditions, including: Tobacco use. Pregnancy. Having a hiatal hernia. Alcohol use. Certain foods and beverages, such as coffee, chocolate, onions, and peppermint. What increases the risk? You are more likely to develop this condition if you: Have an increased body weight. Have a connective tissue disorder. Take NSAIDs, such as ibuprofen. What are the signs or symptoms? Symptoms of this condition include: Heartburn. Difficult or painful swallowing and the feeling of having a lump in the throat. A bitter taste in the mouth. Bad breath and having a large amount of saliva. Having an upset or bloated stomach and belching. Chest pain. Different conditions can cause chest pain. Make sure you  see your health care provider if you experience chest pain. Shortness of breath or wheezing. Ongoing (chronic) cough or a nighttime cough. Wearing away of tooth enamel. Weight loss. How is this diagnosed? This condition may be diagnosed based on a medical history and a physical exam. To determine if you have mild or severe GERD, your health care provider may also monitor how you respond to treatment. You may also have tests, including: A test to examine your stomach and esophagus with a small camera (endoscopy). A test that measures the acidity level in your esophagus. A test that measures how much pressure is on your esophagus. A barium swallow or modified barium swallow test to show the shape, size, and functioning of your esophagus. How is this treated? Treatment for this condition may vary depending on how severe your symptoms are. Your health care provider may recommend: Changes to your diet. Medicine. Surgery. The goal of treatment is to help relieve your symptoms and to prevent complications. Follow these instructions at home: Eating and drinking  Follow a diet as recommended by your health care provider. This may involve avoiding foods and drinks such as: Coffee and tea, with or without caffeine. Drinks that contain alcohol. Energy drinks and sports drinks. Carbonated drinks or sodas. Chocolate and cocoa. Peppermint and mint flavorings. Garlic and onions. Horseradish. Spicy and acidic foods, including peppers, chili powder, curry powder, vinegar, hot sauces, and barbecue sauce. Citrus fruit juices and citrus fruits, such as oranges, lemons, and limes. Tomato-based  foods, such as red sauce, chili, salsa, and pizza with red sauce. Fried and fatty foods, such as donuts, french fries, potato chips, and high-fat dressings. High-fat meats, such as hot dogs and fatty cuts of red and white meats, such as rib eye steak, sausage, ham, and bacon. High-fat dairy items, such as whole  milk, butter, and cream cheese. Eat small, frequent meals instead of large meals. Avoid drinking large amounts of liquid with your meals. Avoid eating meals during the 2-3 hours before bedtime. Avoid lying down right after you eat. Do not exercise right after you eat. Lifestyle  Do not use any products that contain nicotine or tobacco. These products include cigarettes, chewing tobacco, and vaping devices, such as e-cigarettes. If you need help quitting, ask your health care provider. Try to reduce your stress by using methods such as yoga or meditation. If you need help reducing stress, ask your health care provider. If you are overweight, reduce your weight to an amount that is healthy for you. Ask your health care provider for guidance about a safe weight loss goal. General instructions Pay attention to any changes in your symptoms. Take over-the-counter and prescription medicines only as told by your health care provider. Do not take aspirin, ibuprofen, or other NSAIDs unless your health care provider told you to take these medicines. Wear loose-fitting clothing. Do not wear anything tight around your waist that causes pressure on your abdomen. Raise (elevate) the head of your bed about 6 inches (15 cm). You can use a wedge to do this. Avoid bending over if this makes your symptoms worse. Keep all follow-up visits. This is important. Contact a health care provider if: You have: New symptoms. Unexplained weight loss. Difficulty swallowing or it hurts to swallow. Wheezing or a persistent cough. A hoarse voice. Your symptoms do not improve with treatment. Get help right away if: You have sudden pain in your arms, neck, jaw, teeth, or back. You suddenly feel sweaty, dizzy, or light-headed. You have chest pain or shortness of breath. You vomit and the vomit is green, yellow, or black, or it looks like blood or coffee grounds. You faint. You have stool that is red, bloody, or  black. You cannot swallow, drink, or eat. These symptoms may represent a serious problem that is an emergency. Do not wait to see if the symptoms will go away. Get medical help right away. Call your local emergency services (911 in the [place] Do not drive yourself to the hospital. Summary Gastroesophageal reflux happens when acid from the stomach flows up into the esophagus. GERD is a disease in which the reflux happens often, causes frequent or severe symptoms, or causes problems such as damage to the esophagus. Treatment for this condition may vary depending on how severe your symptoms are. Your health care provider may recommend diet and lifestyle changes, medicine, or surgery. Contact a health care provider if you have new or worsening symptoms. Take over-the-counter and prescription medicines only as told by your health care provider. Do not take aspirin, ibuprofen, or other NSAIDs unless your health care provider told you to do so. Keep all follow-up visits as told by your health care provider. This is important. This information is not intended to replace advice given to you by your health care provider. Make sure you discuss any questions you have with your health care provider. Document Revised: 10/19/2019 Document Reviewed: 10/19/2019 Elsevier Patient Education  [employer].

## 2021-10-02 NOTE — Progress Notes (Signed)
10/02/2021 Megan Booth [id number] [birth year]  Referring provider: Marin Olp, MD Primary GI doctor: Dr. Ardis Hughs  ASSESSMENT AND PLAN:   Esophageal dysphagia S/P hernia repair, with continuing dysphagia/GERD  02/15/2020 barium swallow with recurrent small to moderate hiatal hernia, severe GERD and mild reflux esophagitis, no mass or stricture.  Will increase PPI to BID, given information.  Will proceed with barium swallow, may need to proceed with EGD -     DG ESOPHAGUS W DOUBLE CM (HD); Future -     omeprazole (PRILOSEC) 40 MG capsule; Take 1 capsule (40 mg total) by mouth 2 (two) times daily before a meal.  Gastroesophageal reflux disease without esophagitis -     omeprazole (PRILOSEC) 40 MG capsule; Take 1 capsule (40 mg total) by mouth 2 (two) times daily before a meal. Lifestyle changes discussed, avoid NSAIDS can try voltern gel  History of hernia repair 08/18/2019 laparoscopic paraesophageal hernia repair with fundoplication Last barium 01/2020 showed recurrent small/moderate hiatal hernia Pending this result may need repeat UGI/EGD versus referral back to [facility]  History of adenomatous polyp of colon Colonoscopy good bowel prep 3 polyps 1 to 2 mm in size ascending colon, diverticulosis Pathology showed 2 tubular adenomas and 1 sessile serrated, recall 3 years (06/2022)   History of Present Illness:  76 y.o. female  with a past medical history of hypothyroidism, hyperlipidemia, sleep apnea, diverticulosis, GERD with history of Barrett's esophagus, personal history of colonic polyps and others listed below, returns to clinic today for evaluation of medication refill.  06/24/2019 endoscopy and colonoscopy Endoscopy medium size hiatal hernia possible small paraesophageal component, question upper GI barium examination Colonoscopy good bowel prep 3 polyps 1 to 2 mm in size ascending colon, diverticulosis Pathology showed 2 tubular adenomas and 1 sessile serrated,  recall 3 years (06/2022) 07/09/2019 large gastric outpouching GE junction large paraesophageal hernia measures up to 7 cm with 1.5 cm mouth/neck emptied several times with severe reflux in the high thoracic esophagus.   08/18/2019 laparoscopic paraesophageal hernia repair with fundoplication Patient is on Prilosec  40 once daily in the morning for reflux and Barrett's. She states she continues to have issues with meats, has had to have the heimlich maneuver once last year. Very rare issues with liquids, worse with carbonation.  02/15/2020 barium swallow with recurrent small to moderate hiatal hernia, severe GERD and mild reflux esophagitis, no mass or stricture.   She is on ozempic x Nov and has had weight loss with that, 21 lbs. States GERD is better after weight loss.  Rare night time symptoms if she eats late.  She takes ibuprofen '200mg'$  3 tablets a day, has been doing this x 1 year.  Very seldom ETOH.  No black stools, AB pain.  Has BM daily.   Current Medications:   Current Outpatient Medications (Endocrine & Metabolic):    levothyroxine (SYNTHROID) 25 MCG tablet, Take 1 tablet (25 mcg total) by mouth daily.   Semaglutide (OZEMPIC, 1 MG/DOSE, [place]), Inject into the skin once a week. On wednesday  Current Outpatient Medications (Cardiovascular):    atorvastatin (LIPITOR) 40 MG tablet, TAKE 1 TABLET ONCE DAILY AT 6 PM. (Patient taking differently: daily.)   furosemide (LASIX) 20 MG tablet, Take 1 tablet (20 mg total) by mouth daily as needed for fluid.   Current Outpatient Medications (Analgesics):    acetaminophen (TYLENOL) 500 MG tablet, Take 500-1,000 mg by mouth every 6 (six) hours as needed for mild pain or headache.  ibuprofen (ADVIL) 400 MG tablet, Take 1 tablet (400 mg total) by mouth every 6 (six) hours as needed.  Current Outpatient Medications (Hematological):    vitamin B-12 (CYANOCOBALAMIN) 1000 MCG tablet, Take 1,000 mcg by mouth daily.  Current Outpatient  Medications (Other):    Artificial Tear Ointment (DRY EYES OP), Place 1 drop into both eyes daily as needed (Dry eye).   Cholecalciferol (VITAMIN D3) 1000 units CAPS, Take 1,000 Units by mouth daily.   famotidine (PEPCID) 20 MG tablet, Take 20 mg by mouth at bedtime.   nystatin-triamcinolone (MYCOLOG II) cream, Apply 1 application topically daily as needed (female lesions).    PARoxetine (PAXIL) 10 MG tablet, Take 1 tablet (10 mg total) by mouth daily.   zolpidem (AMBIEN) 10 MG tablet, TAKE 1/2 TO 1 TABLET AT BEDTIME AS NEEDED FOR SLEEP.   omeprazole (PRILOSEC) 40 MG capsule, Take 1 capsule (40 mg total) by mouth 2 (two) times daily before a meal.  Surgical History:  She  has a past surgical history that includes LASIK (Bilateral); Breast enhancement surgery (Bilateral); Dilation and curettage of uterus; Hysteroscopy; Tubal ligation; Hiatal hernia repair (N/A, 08/18/2019); Upper gi endoscopy (06/24/2019); colonscopy (06/24/2019); left wrist skin cancer area removed ; and Dilatation & curettage/hysteroscopy with myosure (N/A, 06/16/2021). Family History:  Her family history includes Cancer (age of onset: 108) in her father; Dementia in her mother; Depression in her mother; Diabetes in her mother; Heart disease in her mother; Hypertension in her mother; Seizures in her brother. Social History:   reports that she has never smoked. She has never used smokeless tobacco. She reports that she does not currently use alcohol after a past usage of about 1.0 standard drink of alcohol per week. She reports that she does not use drugs.  Current Medications, Allergies, Past Medical History, Past Surgical History, Family History and Social History were reviewed in [employer] record.  Physical Exam: BP 120/70   Pulse 96   Ht '5\' 2"'$  (1.575 m)   Wt 165 lb (74.8 kg)   BMI 30.18 kg/m  General:   Pleasant, well developed female in no acute distress Heart : Regular rate and rhythm; no  murmurs Pulm: Clear anteriorly; no wheezing Abdomen:  Soft, Flat AB, Active bowel sounds. No tenderness . , No organomegaly appreciated. Rectal: Not evaluated Extremities:  without  edema. Neurologic:  Alert and  oriented x4;  No focal deficits.  Psych:  Cooperative. Normal mood and affect.   Vladimir Crofts, PA-C 10/02/21

## 2021-10-09 NOTE — Progress Notes (Signed)
I agree with the above note, plan 

## 2021-10-10 ENCOUNTER — Ambulatory Visit (HOSPITAL_COMMUNITY)
Admission: RE | Admit: 2021-10-10 | Discharge: 2021-10-10 | Disposition: A | Discharge status: Home / Self Care | Payer: Medicare Other | Source: Ambulatory Visit | Attending: Physician Assistant | Admitting: Physician Assistant

## 2021-10-10 DIAGNOSIS — R1319 Other dysphagia: Secondary | ICD-10-CM | POA: Insufficient documentation

## 2021-10-10 DIAGNOSIS — K219 Gastro-esophageal reflux disease without esophagitis: Secondary | ICD-10-CM | POA: Diagnosis not present

## 2021-10-12 ENCOUNTER — Other Ambulatory Visit: Payer: Self-pay

## 2021-10-12 ENCOUNTER — Telehealth: Payer: Self-pay | Admitting: Physician Assistant

## 2021-10-12 DIAGNOSIS — K449 Diaphragmatic hernia without obstruction or gangrene: Secondary | ICD-10-CM

## 2021-10-12 DIAGNOSIS — K219 Gastro-esophageal reflux disease without esophagitis: Secondary | ICD-10-CM

## 2021-10-12 NOTE — Telephone Encounter (Signed)
Spoke with patient regarding results & recommendations. Patient has been scheduled for an EGD in the [place] on 10/27/21 at 10:00 am with Dr. Ardis Hughs. Patient is not on any blood thinners or diabetic. Instructions have been sent to patient through mychart. Amb ref placed. Patient verbalized all understanding, no further questions.

## 2021-10-12 NOTE — Telephone Encounter (Signed)
Patient called and scheduled a f/u appt with Estill Bamberg to discuss results of recent Barium Swallow and scheduling an EGD with Dr. Ardis Hughs.  She is questioning whether she needs to come in for the OV, or whether she can just directly schedule the EGD with Dr. Ardis Hughs.  Does she need to have a PV prior to scheduling or should she just keep the OV with Estill Bamberg to discuss everything?  Please call patient and advise.  Thank you.

## 2021-10-27 ENCOUNTER — Ambulatory Visit (AMBULATORY_SURGERY_CENTER): Payer: Medicare Other | Admitting: Gastroenterology

## 2021-10-27 ENCOUNTER — Encounter: Payer: Self-pay | Admitting: Gastroenterology

## 2021-10-27 VITALS — BP 103/64 | HR 67 | Temp 97.5°F | Resp 14 | Ht 62.0 in | Wt 165.0 lb

## 2021-10-27 DIAGNOSIS — K219 Gastro-esophageal reflux disease without esophagitis: Secondary | ICD-10-CM

## 2021-10-27 DIAGNOSIS — R12 Heartburn: Secondary | ICD-10-CM

## 2021-10-27 DIAGNOSIS — R1310 Dysphagia, unspecified: Secondary | ICD-10-CM | POA: Diagnosis not present

## 2021-10-27 DIAGNOSIS — K449 Diaphragmatic hernia without obstruction or gangrene: Secondary | ICD-10-CM

## 2021-10-27 MED ORDER — ESOMEPRAZOLE MAGNESIUM 40 MG PO CPDR: 40.0000 mg | DELAYED_RELEASE_CAPSULE | Freq: Two times a day (BID) | ORAL | 2 refills | Status: DC

## 2021-10-27 MED ORDER — SODIUM CHLORIDE 0.9 % IV SOLN: 500.0000 mL | INTRAVENOUS | Status: DC

## 2021-10-27 NOTE — Progress Notes (Signed)
  The recent H&P (dated 10/02/21) was reviewed, the patient was examined and there is no change in the patients condition since that H&P was completed.   Milus Banister  10/27/2021, 9:37 AM

## 2021-10-27 NOTE — Patient Instructions (Addendum)
Resume previous diet.  Continue present medications.  NEW prescription for TWICE daily eosmeprazole 40 mg before breakfast and dinner.  Continue taking pepcid at bedtime.  OV with Dr Ardis Hughs in 2 months to see how this is helping.   YOU HAD AN ENDOSCOPIC PROCEDURE TODAY AT [facility]:   Refer to the procedure report that was given to you for any specific questions about what was found during the examination.  If the procedure report does not answer your questions, please call your gastroenterologist to clarify.  If you requested that your care partner not be given the details of your procedure findings, then the procedure report has been included in a sealed envelope for you to review at your convenience later.  YOU SHOULD EXPECT: Some feelings of bloating in the abdomen. Passage of more gas than usual.  Walking can help get rid of the air that was put into your GI tract during the procedure and reduce the bloating. If you had a lower endoscopy (such as a colonoscopy or flexible sigmoidoscopy) you may notice spotting of blood in your stool or on the toilet paper. If you underwent a bowel prep for your procedure, you may not have a normal bowel movement for a few days.  Please Note:  You might notice some irritation and congestion in your nose or some drainage.  This is from the oxygen used during your procedure.  There is no need for concern and it should clear up in a day or so.  SYMPTOMS TO REPORT IMMEDIATELY:   Following upper endoscopy (EGD)  Vomiting of blood or coffee ground material  New chest pain or pain under the shoulder blades  Painful or persistently difficult swallowing  New shortness of breath  Fever of 100F or higher  Black, tarry-looking stools  For urgent or emergent issues, a gastroenterologist can be reached at any hour by calling [phone]. Do not use MyChart messaging for urgent concerns.    DIET:  We do recommend a small meal at first, but then  you may proceed to your regular diet.  Drink plenty of fluids but you should avoid alcoholic beverages for 24 hours.  ACTIVITY:  You should plan to take it easy for the rest of today and you should NOT DRIVE or use heavy machinery until tomorrow (because of the sedation medicines used during the test).    FOLLOW UP: Our staff will call the number listed on your records the next business day following your procedure.  We will call around 7:15- 8:00 am to check on you and address any questions or concerns that you may have regarding the information given to you following your procedure. If we do not reach you, we will leave a message.  If you develop any symptoms (ie: fever, flu-like symptoms, shortness of breath, cough etc.) before then, please call [phone].  If you test positive for Covid 19 in the 2 weeks post procedure, please call and report this information to [place].    If any biopsies were taken you will be contacted by phone or by letter within the next 1-3 weeks.  Please call us at [phone] if you have not heard about the biopsies in 3 weeks.    SIGNATURES/CONFIDENTIALITY: You and/or your care partner have signed paperwork which will be entered into your electronic medical record.  These signatures attest to the fact that that the information above on your After Visit Summary has been reviewed and is understood.  Full  responsibility of the confidentiality of this discharge information lies with you and/or your care-partner.  

## 2021-10-27 NOTE — Progress Notes (Signed)
A and O x3. Report to RN. Tolerated MAC anesthesia well.Teeth unchanged after procedure. 

## 2021-10-27 NOTE — Op Note (Signed)
[place] Patient Name: Megan Booth Procedure Date: 10/27/2021 9:42 AM MRN: [id number] Endoscopist: Milus Banister , MD Age: 76 Referring MD:  Date of Birth: [birth year] Gender: Female Account #: [id number] Procedure:                Upper GI endoscopy Indications:              Dysphagia, Heartburn. Large paraesophageal HH                            repaired 2021. Now with recurrent HH on barium                            esophagram, 'enlarging' per radiologist report. She                            has heartburn and intermittent dysphagia. Currently                            on omeprazole 40 every AM and famotidine at bedtime. Medicines:                Monitored Anesthesia Care Procedure:                Pre-Anesthesia Assessment:                           - Prior to the procedure, a History and Physical                            was performed, and patient medications and                            allergies were reviewed. The patient's tolerance of                            previous anesthesia was also reviewed. The risks                            and benefits of the procedure and the sedation                            options and risks were discussed with the patient.                            All questions were answered, and informed consent                            was obtained. Prior Anticoagulants: The patient has                            taken no previous anticoagulant or antiplatelet                            agents. ASA Grade Assessment: II - A patient with  mild systemic disease. After reviewing the risks                            and benefits, the patient was deemed in                            satisfactory condition to undergo the procedure.                           After obtaining informed consent, the endoscope was                            passed under direct vision. Throughout the                             procedure, the patient's blood pressure, pulse, and                            oxygen saturations were monitored continuously. The                            Endoscope was introduced through the mouth, and                            advanced to the second part of duodenum. The upper                            GI endoscopy was accomplished without difficulty.                            The patient tolerated the procedure well. Scope In: Scope Out: Findings:                 A medium sized hiatal hernia was found, there was a                            clear substantial paraesophageal component (see                            image). This causes typical esophageal totuosity                            and foreshortening.                           Normal Z line without stenosis or stricture.                           The exam was otherwise without abnormality. Complications:            No immediate complications. Estimated blood loss:                            None. Estimated Blood Loss:     Estimated blood loss: none. Impression:               -  A medium sized hiatal hernia was found, there was                            a clear substantial paraesophageal component (see                            image). This causes typical esophageal totuosity                            and foreshortening.                           - Normal Z line without stenosis or stricture.                           - The examination was otherwise normal. Recommendation:           - Resume previous diet.                           - Continue present medications. New prescription                            for TWICE daily omeprazole called in today ('40mg'$                             pills, BID AC breakfast and dinner, disp 3 months,                            refills). Continue taking pepcid (famotidine) at                            bedtime.                           - OV with Dr. Ardis Hughs in two months to see how this                             is helping. Milus Banister, MD 10/27/2021 10:01:21 AM This report has been signed electronically.

## 2021-10-30 ENCOUNTER — Telehealth: Payer: Self-pay

## 2021-10-30 NOTE — Telephone Encounter (Signed)
Attempted to reach pt. With follow-up call following endoscopic procedure 10/27/2021.  LM On pt. Voice mail to call if she has any questions or concerns.

## 2021-11-07 ENCOUNTER — Ambulatory Visit: Payer: Medicare Other | Admitting: Physician Assistant

## 2021-11-09 DIAGNOSIS — E785 Hyperlipidemia, unspecified: Secondary | ICD-10-CM | POA: Diagnosis not present

## 2021-11-09 DIAGNOSIS — R7301 Impaired fasting glucose: Secondary | ICD-10-CM | POA: Diagnosis not present

## 2021-11-09 DIAGNOSIS — N1831 Chronic kidney disease, stage 3a: Secondary | ICD-10-CM | POA: Diagnosis not present

## 2021-11-20 ENCOUNTER — Other Ambulatory Visit: Payer: Self-pay | Admitting: Family Medicine

## 2022-01-02 ENCOUNTER — Ambulatory Visit: Payer: Medicare Other | Admitting: Gastroenterology

## 2022-01-03 NOTE — Progress Notes (Signed)
01/04/2022 Megan Booth [id number] [birth year]  Referring provider: Michael Boston, MD Primary GI doctor: Dr. Havery Moros ( Dr. Ardis Hughs)  ASSESSMENT AND PLAN:   Assessment: 76 y.o. female here for assessment of the following: 1. History of hernia repair   2. Gastroesophageal reflux disease without esophagitis   3. Esophageal dysphagia   4. History of adenomatous polyp of colon   5. Hypothyroidism, unspecified type   6. Bilateral hand swelling    Recurrent hiatal hernia with dysphagia 10/10/2021 barium swallow showed enlarging hiatal hernia with possible propulsion diverticulum, no masses, spontaneous GERD with probable reflux esophagitis, no strictures or masses 10/27/2021 EGD moderate size hiatal hernia there is clear substantial paraesophageal component, typical esophageal tortuosity and foreshortening, no stenosis or strictures.   Symptoms improved with nexium twice a day Some increase swelling in hands and feet without pain Will check labs to evaluate TSH with recent weight loss, no CP, SOB Follow up with PCP in Dec Can consider switching to pantoprazole twice daily but uncertain nexium is causing the swelling.  If medications stop working, can consider referral to surgery for repair  2021 colonoscopy, no recall due to age.  Follow up 3 months  Orders Placed This Encounter  Procedures   CBC with Differential/Platelet   Comprehensive metabolic panel   TSH   Sedimentation rate    History of Present Illness:  76 y.o. female  with a past medical history of hypothyroidism, sleep apnea, GERD, CKD stage III, B12 deficiency, history of colonic polyps, GERD with Barrett's esophagus and others listed below, returns to clinic today for follow-up from office visit 10/02/2021 for esophageal dysphagia.  Patient status post hernia repair 07/2019 with continuing dysphagia/GERD.   02/15/2020 barium swallow recurrent small to moderate hiatal hernia severe GERD, no mass or stricture.   Increase PPI to twice daily. 10/10/2021 barium swallow showed enlarging hiatal hernia with possible propulsion diverticulum, no masses, spontaneous GERD with probable reflux esophagitis, no strictures or masses 10/27/2021 EGD moderate size hiatal hernia there is clear substantial paraesophageal component, typical esophageal tortuosity and foreshortening, no stenosis or strictures.    Continue twice daily Nexium before food and famotidine 20 mg at bedtime. She states the medications are working well for her symptoms, she states her symptoms have improved with the nexium twice a day.  She states since the EGD, she has had some mild swelling bilateral hands and feet in the last 10 days.  She has been on ozempic since last Nov, has lost 27 lbs, has not had thyroid checked since 10/2020.  She is on lasix as needed.  Denies stiffness in hands or feet.  Denies SOB, chest pain.   Previous history: 06/24/2019 endoscopy and colonoscopy Endoscopy medium size hiatal hernia possible small paraesophageal component, question upper GI barium examination Colonoscopy good bowel prep 3 polyps 1 to 2 mm in size ascending colon, diverticulosis Pathology showed 2 tubular adenomas and 1 sessile serrated, recall 3 years (06/2022) 07/09/2019 large gastric outpouching GE junction large paraesophageal hernia measures up to 7 cm with 1.5 cm mouth/neck emptied several times with severe reflux in the high thoracic esophagus.   08/18/2019 laparoscopic paraesophageal hernia repair with fundoplication 67/67/2094 barium swallow with recurrent small to moderate hiatal hernia, severe GERD and mild reflux esophagitis, no mass or stricture. 10/10/2021 barium swallow showed enlarging hiatal hernia with possible propulsion diverticulum, no masses, spontaneous GERD with probable reflux esophagitis, no strictures or masses 10/27/2021 EGD moderate size hiatal hernia there is clear substantial paraesophageal component,  typical esophageal  tortuosity and foreshortening, no stenosis or strictures.     She  reports that she has never smoked. She has never used smokeless tobacco. She reports that she does not currently use alcohol after a past usage of about 1.0 standard drink of alcohol per week. She reports that she does not use drugs. Her family history includes Cancer (age of onset: 15) in her father; Dementia in her mother; Depression in her mother; Diabetes in her mother; Heart disease in her mother; Hypertension in her mother; Seizures in her brother.   Current Medications:   Current Outpatient Medications (Endocrine & Metabolic):    levothyroxine (SYNTHROID) 25 MCG tablet, Take 1 tablet (25 mcg total) by mouth daily.   Semaglutide (OZEMPIC, 1 MG/DOSE, [place]), Inject into the skin once a week. On wednesday  Current Outpatient Medications (Cardiovascular):    atorvastatin (LIPITOR) 40 MG tablet, TAKE 1 TABLET ONCE DAILY AT 6 PM. (Patient taking differently: daily.)   furosemide (LASIX) 20 MG tablet, Take 1 tablet (20 mg total) by mouth daily as needed for fluid.   Current Outpatient Medications (Analgesics):    acetaminophen (TYLENOL) 500 MG tablet, Take 500-1,000 mg by mouth every 6 (six) hours as needed for mild pain or headache.    ibuprofen (ADVIL) 400 MG tablet, Take 1 tablet (400 mg total) by mouth every 6 (six) hours as needed.  Current Outpatient Medications (Hematological):    vitamin B-12 (CYANOCOBALAMIN) 1000 MCG tablet, Take 1,000 mcg by mouth daily.  Current Outpatient Medications (Other):    Artificial Tear Ointment (DRY EYES OP), Place 1 drop into both eyes daily as needed (Dry eye).   Cholecalciferol (VITAMIN D3) 1000 units CAPS, Take 1,000 Units by mouth daily.   esomeprazole (NEXIUM) 40 MG capsule, Take 1 capsule (40 mg total) by mouth 2 (two) times daily before a meal.   famotidine (PEPCID) 20 MG tablet, Take 20 mg by mouth at bedtime.   nystatin-triamcinolone (MYCOLOG II) cream, Apply 1 application  topically daily as needed (female lesions).    PARoxetine (PAXIL) 10 MG tablet, Take 1 tablet (10 mg total) by mouth daily.   zolpidem (AMBIEN) 10 MG tablet, TAKE 1/2 TO 1 TABLET AT BEDTIME AS NEEDED FOR SLEEP.  Surgical History:  She  has a past surgical history that includes LASIK (Bilateral); Breast enhancement surgery (Bilateral); Dilation and curettage of uterus; Hysteroscopy; Tubal ligation; Hiatal hernia repair (N/A, 08/18/2019); Upper gi endoscopy (06/24/2019); colonscopy (06/24/2019); left wrist skin cancer area removed ; and Dilatation & curettage/hysteroscopy with myosure (N/A, 06/16/2021).  Current Medications, Allergies, Past Medical History, Past Surgical History, Family History and Social History were reviewed in [employer] record.  Physical Exam: BP 118/72   Pulse 80   Ht '5\' 2"'$  (1.575 m)   Wt 165 lb 9.6 oz (75.1 kg)   SpO2 95%   BMI 30.29 kg/m  General:   Pleasant, well developed female in no acute distress Heart : Regular rate and rhythm; no murmurs Pulm: Clear anteriorly; no wheezing Abdomen:  Soft, Non-distended AB, Active bowel sounds. No tenderness . Without guarding and Without rebound, No organomegaly appreciated. Rectal: Not evaluated Extremities:  non pitting very mild edema legs, bilateral hands with no erythema, swelling or warmth.  Neurologic:  Alert and  oriented x4;  No focal deficits.  Psych:  Cooperative. Normal mood and affect.   Vladimir Crofts, PA-C 01/04/22

## 2022-01-04 ENCOUNTER — Ambulatory Visit (INDEPENDENT_AMBULATORY_CARE_PROVIDER_SITE_OTHER): Payer: Medicare Other | Admitting: Physician Assistant

## 2022-01-04 ENCOUNTER — Encounter: Payer: Self-pay | Admitting: Physician Assistant

## 2022-01-04 VITALS — BP 118/72 | HR 80 | Ht 62.0 in | Wt 165.6 lb

## 2022-01-04 DIAGNOSIS — Z9889 Other specified postprocedural states: Secondary | ICD-10-CM

## 2022-01-04 DIAGNOSIS — E039 Hypothyroidism, unspecified: Secondary | ICD-10-CM

## 2022-01-04 DIAGNOSIS — R1319 Other dysphagia: Secondary | ICD-10-CM | POA: Diagnosis not present

## 2022-01-04 DIAGNOSIS — K219 Gastro-esophageal reflux disease without esophagitis: Secondary | ICD-10-CM | POA: Diagnosis not present

## 2022-01-04 DIAGNOSIS — Z8719 Personal history of other diseases of the digestive system: Secondary | ICD-10-CM | POA: Diagnosis not present

## 2022-01-04 DIAGNOSIS — Z86010 Personal history of colonic polyps: Secondary | ICD-10-CM

## 2022-01-04 DIAGNOSIS — M7989 Other specified soft tissue disorders: Secondary | ICD-10-CM

## 2022-01-04 NOTE — Progress Notes (Signed)
Agree with assessment and plan as outlined.  

## 2022-01-04 NOTE — Patient Instructions (Signed)
Your provider has requested that you go to the basement level for lab work before leaving today. Press "B" on the elevator. The lab is located at the first door on the left as you exit the elevator.  If labs are normal can consider switching nexium to pantoprazole twice a day if symptoms continue  Gastroparesis- from ozempic Please do small frequent meals like 4-6 meals a day.  Eat and drink liquids at separate times.  Avoid high fiber foods, cook your vegetables, avoid high fat food.  Suggest spreading protein throughout the day (greek yogurt, glucerna, soft meat, milk, eggs) Choose soft foods that you can mash with a fork When you are more symptomatic, change to pureed foods foods and liquids.  Consider reading "Living well with Gastroparesis" by Lambert Keto Gastroparesis is a condition in which food takes longer than normal to empty from the stomach. This condition is also known as delayed gastric emptying. It is usually a long-term (chronic) condition. There is no cure, but there are treatments and things that you can do at home to help relieve symptoms. Treating the underlying condition that causes gastroparesis can also help relieve symptoms   Please take PPI or stomach medication 30 minutes to 1 hour before meals- this makes it more effective.  Avoid spicy and acidic foods Avoid fatty foods Limit your intake of coffee, tea, alcohol, and carbonated drinks Work to maintain a healthy weight Keep the head of the bed elevated at least 3 inches with blocks or a wedge pillow if you are having any nighttime symptoms Stay upright for 2 hours after eating Avoid meals and snacks three to four hours before bedtime

## 2022-01-05 ENCOUNTER — Other Ambulatory Visit (INDEPENDENT_AMBULATORY_CARE_PROVIDER_SITE_OTHER): Payer: Medicare Other

## 2022-01-05 DIAGNOSIS — E039 Hypothyroidism, unspecified: Secondary | ICD-10-CM | POA: Diagnosis not present

## 2022-01-05 DIAGNOSIS — M7989 Other specified soft tissue disorders: Secondary | ICD-10-CM | POA: Diagnosis not present

## 2022-01-05 LAB — CBC WITH DIFFERENTIAL/PLATELET
Basophils Absolute: 0 10*3/uL (ref 0.0–0.1)
Basophils Relative: 0.8 % (ref 0.0–3.0)
Eosinophils Absolute: 0.2 10*3/uL (ref 0.0–0.7)
Eosinophils Relative: 3.3 % (ref 0.0–5.0)
HCT: 36.1 % (ref 36.0–46.0)
Hemoglobin: 12.5 g/dL (ref 12.0–15.0)
Lymphocytes Relative: 33.9 % (ref 12.0–46.0)
Lymphs Abs: 1.9 10*3/uL (ref 0.7–4.0)
MCHC: 34.5 g/dL (ref 30.0–36.0)
MCV: 90.2 fl (ref 78.0–100.0)
Monocytes Absolute: 0.5 10*3/uL (ref 0.1–1.0)
Monocytes Relative: 8.2 % (ref 3.0–12.0)
Neutro Abs: 3.1 10*3/uL (ref 1.4–7.7)
Neutrophils Relative %: 53.8 % (ref 43.0–77.0)
Platelets: 275 10*3/uL (ref 150.0–400.0)
RBC: 4.01 Mil/uL (ref 3.87–5.11)
RDW: 13.5 % (ref 11.5–15.5)
WBC: 5.7 10*3/uL (ref 4.0–10.5)

## 2022-01-05 LAB — COMPREHENSIVE METABOLIC PANEL
ALT: 20 U/L (ref 0–35)
AST: 18 U/L (ref 0–37)
Albumin: 3.9 g/dL (ref 3.5–5.2)
Alkaline Phosphatase: 93 U/L (ref 39–117)
BUN: 22 mg/dL (ref 6–23)
CO2: 25 mEq/L (ref 19–32)
Calcium: 9.5 mg/dL (ref 8.4–10.5)
Chloride: 107 mEq/L (ref 96–112)
Creatinine, Ser: 1.03 mg/dL (ref 0.40–1.20)
GFR: 53.68 mL/min — ABNORMAL LOW (ref >60.00)
Glucose, Bld: 98 mg/dL (ref 70–99)
Potassium: 4 mEq/L (ref 3.5–5.1)
Sodium: 140 mEq/L (ref 135–145)
Total Bilirubin: 0.4 mg/dL (ref 0.2–1.2)
Total Protein: 6.4 g/dL (ref 6.0–8.3)

## 2022-01-05 LAB — TSH: TSH: 2.34 u[IU]/mL (ref 0.35–5.50)

## 2022-01-05 LAB — SEDIMENTATION RATE: Sed Rate: 9 mm/hr (ref 0–30)

## 2022-01-10 ENCOUNTER — Telehealth: Payer: Self-pay | Admitting: Gastroenterology

## 2022-01-10 NOTE — Telephone Encounter (Signed)
PT is returning call. Please advise.  

## 2022-01-10 NOTE — Telephone Encounter (Signed)
Refer to lab result note 01/05/22.

## 2022-01-23 DIAGNOSIS — M5411 Radiculopathy, occipito-atlanto-axial region: Secondary | ICD-10-CM | POA: Diagnosis not present

## 2022-01-23 DIAGNOSIS — M9901 Segmental and somatic dysfunction of cervical region: Secondary | ICD-10-CM | POA: Diagnosis not present

## 2022-01-26 ENCOUNTER — Other Ambulatory Visit: Payer: Self-pay | Admitting: Family Medicine

## 2022-02-02 ENCOUNTER — Other Ambulatory Visit: Payer: Self-pay | Admitting: Gastroenterology

## 2022-02-02 NOTE — Telephone Encounter (Signed)
Good Morning Dr Rush Landmark,  This is a patient of Dr Ardis Hughs.   Please advise refills as you are DOD am.  Thank you

## 2022-02-06 DIAGNOSIS — M9901 Segmental and somatic dysfunction of cervical region: Secondary | ICD-10-CM | POA: Diagnosis not present

## 2022-02-06 DIAGNOSIS — M5411 Radiculopathy, occipito-atlanto-axial region: Secondary | ICD-10-CM | POA: Diagnosis not present

## 2022-02-15 DIAGNOSIS — Z23 Encounter for immunization: Secondary | ICD-10-CM | POA: Diagnosis not present

## 2022-02-22 DIAGNOSIS — M85852 Other specified disorders of bone density and structure, left thigh: Secondary | ICD-10-CM | POA: Diagnosis not present

## 2022-02-22 DIAGNOSIS — Z1231 Encounter for screening mammogram for malignant neoplasm of breast: Secondary | ICD-10-CM | POA: Diagnosis not present

## 2022-02-22 DIAGNOSIS — Z780 Asymptomatic menopausal state: Secondary | ICD-10-CM | POA: Diagnosis not present

## 2022-02-22 DIAGNOSIS — M85851 Other specified disorders of bone density and structure, right thigh: Secondary | ICD-10-CM | POA: Diagnosis not present

## 2022-02-27 DIAGNOSIS — M5411 Radiculopathy, occipito-atlanto-axial region: Secondary | ICD-10-CM | POA: Diagnosis not present

## 2022-02-27 DIAGNOSIS — M9901 Segmental and somatic dysfunction of cervical region: Secondary | ICD-10-CM | POA: Diagnosis not present

## 2022-03-12 DIAGNOSIS — M25561 Pain in right knee: Secondary | ICD-10-CM | POA: Diagnosis not present

## 2022-03-19 DIAGNOSIS — D1801 Hemangioma of skin and subcutaneous tissue: Secondary | ICD-10-CM | POA: Diagnosis not present

## 2022-03-19 DIAGNOSIS — L820 Inflamed seborrheic keratosis: Secondary | ICD-10-CM | POA: Diagnosis not present

## 2022-03-19 DIAGNOSIS — D225 Melanocytic nevi of trunk: Secondary | ICD-10-CM | POA: Diagnosis not present

## 2022-03-19 DIAGNOSIS — L821 Other seborrheic keratosis: Secondary | ICD-10-CM | POA: Diagnosis not present

## 2022-03-19 DIAGNOSIS — D2271 Melanocytic nevi of right lower limb, including hip: Secondary | ICD-10-CM | POA: Diagnosis not present

## 2022-03-19 DIAGNOSIS — L814 Other melanin hyperpigmentation: Secondary | ICD-10-CM | POA: Diagnosis not present

## 2022-03-19 DIAGNOSIS — L603 Nail dystrophy: Secondary | ICD-10-CM | POA: Diagnosis not present

## 2022-03-19 DIAGNOSIS — Z85828 Personal history of other malignant neoplasm of skin: Secondary | ICD-10-CM | POA: Diagnosis not present

## 2022-03-23 ENCOUNTER — Other Ambulatory Visit (HOSPITAL_BASED_OUTPATIENT_CLINIC_OR_DEPARTMENT_OTHER): Payer: Self-pay

## 2022-03-23 ENCOUNTER — Other Ambulatory Visit (HOSPITAL_COMMUNITY): Payer: Self-pay

## 2022-03-23 DIAGNOSIS — R8290 Unspecified abnormal findings in urine: Secondary | ICD-10-CM | POA: Diagnosis not present

## 2022-03-23 DIAGNOSIS — Z6829 Body mass index (BMI) 29.0-29.9, adult: Secondary | ICD-10-CM | POA: Diagnosis not present

## 2022-03-23 DIAGNOSIS — E785 Hyperlipidemia, unspecified: Secondary | ICD-10-CM | POA: Diagnosis not present

## 2022-03-23 DIAGNOSIS — F419 Anxiety disorder, unspecified: Secondary | ICD-10-CM | POA: Diagnosis not present

## 2022-03-23 DIAGNOSIS — R7301 Impaired fasting glucose: Secondary | ICD-10-CM | POA: Diagnosis not present

## 2022-03-23 DIAGNOSIS — E663 Overweight: Secondary | ICD-10-CM | POA: Diagnosis not present

## 2022-03-23 MED ORDER — OZEMPIC (1 MG/DOSE) 4 MG/3ML ~~LOC~~ SOPN
1.0000 mg | PEN_INJECTOR | SUBCUTANEOUS | 11 refills | Status: DC
  Filled 2022-03-23 – 2022-04-13 (×2): qty 3, 28d supply, fill #0

## 2022-03-23 MED ORDER — COVID-19 MRNA VAC-TRIS(PFIZER) 30 MCG/0.3ML IM SUSY
0.3000 mL | PREFILLED_SYRINGE | Freq: Once | INTRAMUSCULAR | 0 refills | Status: AC
  Filled 2022-03-23: qty 0.3, 1d supply, fill #0

## 2022-03-23 MED ORDER — OZEMPIC (1 MG/DOSE) 4 MG/3ML ~~LOC~~ SOPN
1.0000 mg | PEN_INJECTOR | SUBCUTANEOUS | 3 refills | Status: DC
Start: 2021-10-11 — End: 2022-05-23
  Filled 2022-03-23: qty 3, 28d supply, fill #0
  Filled 2022-05-13: qty 3, 28d supply, fill #1

## 2022-03-29 DIAGNOSIS — N390 Urinary tract infection, site not specified: Secondary | ICD-10-CM | POA: Diagnosis not present

## 2022-04-05 ENCOUNTER — Encounter: Payer: Self-pay | Admitting: *Deleted

## 2022-04-10 DIAGNOSIS — M5411 Radiculopathy, occipito-atlanto-axial region: Secondary | ICD-10-CM | POA: Diagnosis not present

## 2022-04-10 DIAGNOSIS — M9901 Segmental and somatic dysfunction of cervical region: Secondary | ICD-10-CM | POA: Diagnosis not present

## 2022-04-12 DIAGNOSIS — M542 Cervicalgia: Secondary | ICD-10-CM | POA: Diagnosis not present

## 2022-04-13 ENCOUNTER — Other Ambulatory Visit (HOSPITAL_BASED_OUTPATIENT_CLINIC_OR_DEPARTMENT_OTHER): Payer: Self-pay

## 2022-04-18 ENCOUNTER — Encounter: Payer: Self-pay | Admitting: Gastroenterology

## 2022-04-26 ENCOUNTER — Telehealth: Payer: Self-pay

## 2022-04-26 NOTE — Telephone Encounter (Signed)
Fine by me for this patient to see Dr. Candis Schatz, thanks for asking.

## 2022-04-26 NOTE — Telephone Encounter (Signed)
Pts husband sent a mychart message stating that his wife wants to switch care from Dr. Ardis Hughs to you. He states she needs to be seen for GERD and is requesting she be seen asap. Please advise if you will take pt.

## 2022-04-26 NOTE — Telephone Encounter (Signed)
Dr. Havery Moros this pts husband sees Dr. Candis Schatz and would also like to see him. Will approve the transfer of care?

## 2022-04-27 NOTE — Telephone Encounter (Signed)
Pt scheduled to see Dr. Candis Schatz 05/23/22 at 10:30am. Pt aware of appt.

## 2022-05-11 DIAGNOSIS — M542 Cervicalgia: Secondary | ICD-10-CM | POA: Diagnosis not present

## 2022-05-14 ENCOUNTER — Other Ambulatory Visit (HOSPITAL_BASED_OUTPATIENT_CLINIC_OR_DEPARTMENT_OTHER): Payer: Self-pay

## 2022-05-14 ENCOUNTER — Encounter (HOSPITAL_BASED_OUTPATIENT_CLINIC_OR_DEPARTMENT_OTHER): Payer: Self-pay

## 2022-05-14 ENCOUNTER — Other Ambulatory Visit: Payer: Self-pay | Admitting: Gastroenterology

## 2022-05-15 ENCOUNTER — Encounter (HOSPITAL_BASED_OUTPATIENT_CLINIC_OR_DEPARTMENT_OTHER): Payer: Self-pay | Admitting: Pharmacist

## 2022-05-15 ENCOUNTER — Other Ambulatory Visit (HOSPITAL_BASED_OUTPATIENT_CLINIC_OR_DEPARTMENT_OTHER): Payer: Self-pay

## 2022-05-23 ENCOUNTER — Encounter: Payer: Self-pay | Admitting: Gastroenterology

## 2022-05-23 ENCOUNTER — Ambulatory Visit (INDEPENDENT_AMBULATORY_CARE_PROVIDER_SITE_OTHER): Payer: Medicare Other | Admitting: Gastroenterology

## 2022-05-23 VITALS — BP 92/64 | HR 70 | Ht 62.0 in | Wt 170.2 lb

## 2022-05-23 DIAGNOSIS — Z9889 Other specified postprocedural states: Secondary | ICD-10-CM | POA: Diagnosis not present

## 2022-05-23 DIAGNOSIS — K219 Gastro-esophageal reflux disease without esophagitis: Secondary | ICD-10-CM

## 2022-05-23 DIAGNOSIS — Z8719 Personal history of other diseases of the digestive system: Secondary | ICD-10-CM

## 2022-05-23 DIAGNOSIS — K449 Diaphragmatic hernia without obstruction or gangrene: Secondary | ICD-10-CM | POA: Diagnosis not present

## 2022-05-23 MED ORDER — METOCLOPRAMIDE HCL 10 MG PO TABS
10.0000 mg | ORAL_TABLET | Freq: Every day | ORAL | 0 refills | Status: DC
Start: 2022-05-23 — End: 2023-01-29

## 2022-05-23 NOTE — Patient Instructions (Signed)
If you are age 75 or older, your body mass index should be between 23-30. Your Body mass index is 31.14 kg/m. If this is out of the aforementioned range listed, please consider follow up with your Primary Care Provider.  If you are age 34 or younger, your body mass index should be between 19-25. Your Body mass index is 31.14 kg/m. If this is out of the aformentioned range listed, please consider follow up with your Primary Care Provider.   We have sent the following medications to your pharmacy for you to pick up at your convenience: Reglan 10 mg at night time.  The [facility] providers would like to encourage you to use [facility] : [facility] to communicate with providers for non-urgent requests or questions.  Due to long hold times on the telephone, sending your provider a message by [facility] may be a faster and more efficient way to get a response.  Please allow 48 business hours for a response.  Please remember that this is for non-urgent requests.   It was a pleasure to see you today!  Thank you for trusting me with your gastrointestinal care!    Scott E.Candis Schatz, MD

## 2022-05-23 NOTE — Progress Notes (Unsigned)
HPI : Megan Booth is a very pleasant 76 year old female with a history of anxiety, prediabetes and GERD with paraesophageal hernia s/p hernia repair and fundoplication in 8250 who presents for follow up because of worsening GERD symptoms.   Last night, the patient states that she awoke with a 'gush' of acid coming into her mouth and flooding her throat.  She had burning in her throat and foul taste that lasted several hours and kept her up.  This occurred at 2 am.  She says she ate meatloaf and mashed potatoes for dinner at 7:30pm and went to bed at 11:30 pm.  This has been occurring on average once a week, typically at night, for the past 6 months.  She has more milder symptoms during the day several days a week, but these are not nearly as bothersome or distressing.  She takes Nexium in the morning and then OTC antacids during the day, and usually takes Pepcid at night.  She had an EGD last July with Dr. Ardis Hughs which showed a recurrence of her hernia, consistent with a paraesophageal hernia.  Barium swallows from 2021 post-op and from June 2023 showed interval enlargement of the recurrent hernia.  The patient had been taking Ozempic for the past year, until about a week ago when her insurance stopped covering it.  While on it, she lost 25 lbs and her A1c improved.   Recurrent hiatal hernia with dysphagia 06/24/2019 endoscopy and colonoscopy Endoscopy medium size hiatal hernia possible small paraesophageal component, question upper GI barium examination Colonoscopy good bowel prep 3 polyps 1 to 2 mm in size ascending colon, diverticulosis Pathology showed 2 tubular adenomas and 1 sessile serrated, recall 3 years (06/2022) 07/09/2019 large gastric outpouching GE junction large paraesophageal hernia measures up to 7 cm with 1.5 cm mouth/neck emptied several times with severe reflux in the high thoracic esophagus.   08/18/2019 laparoscopic paraesophageal hernia repair with  fundoplication [id number]/6734 barium swallow recurrent small to moderate hiatal hernia severe GERD, no mass or stricture.  Increase PPI to twice daily.  10/10/2021 barium swallow showed enlarging hiatal hernia with possible propulsion diverticulum, no masses, spontaneous GERD with probable reflux esophagitis, no strictures or masses 10/27/2021 EGD moderate size hiatal hernia there is clear substantial paraesophageal component, typical esophageal tortuosity and foreshortening, no stenosis or strictures  Past Medical History:  Diagnosis Date   Anxiety    Barrett esophagus    Bursitis of right hip    injection with dr Cay Schillings 06-13-2021   Cancer [facility])    left wrist skin cancer   Chronic kidney disease Stage 3    Diverticulosis    GERD (gastroesophageal reflux disease)    history of Amnesia, global, transient 2015   see hospital notes   history of right foot Tendonitis 07/15/2019   wears shoe inserts   Hx of colonic polyps    Dr. Lajoyce Corners    Hyperlipidemia    Hypothyroidism    Leukoplakia, vulva    Lower back pain    Personal history of COVID-19 03/2021   mild symptoms took paxlovid all symptoms resolved   PMB (postmenopausal bleeding) 06/14/2021   Pre-diabetes    Sleep apnea    could not tolerate cpap mild osa per sleep study 5 -6 yrs ago per pt 06-14-2021   Wears partial denture lower      Past Surgical History:  Procedure Laterality Date   BREAST ENHANCEMENT SURGERY Bilateral    40 yrs ago   colonscopy  06/24/2019  DILATATION & CURETTAGE/HYSTEROSCOPY WITH MYOSURE N/A 06/16/2021   Procedure: DILATATION & CURETTAGE/HYSTEROSCOPY WITH MYOSURE;  Surgeon: Deliah Boston, MD;  Location: [place];  Service: Gynecology;  Laterality: N/A;   DILATION AND CURETTAGE OF UTERUS     x 2 last 2020   HIATAL HERNIA REPAIR N/A 08/18/2019   Procedure: LAPAROSCOPIC PARAESOPHAGEAL HERNIA REPAIR WITH FUNDOPLICATION;  Surgeon: Kinsinger, Arta Bruce, MD;  Location: [facility];  Service:  General;  Laterality: N/A;   HYSTEROSCOPY     last done 2 yrs ago   LASIK Bilateral    yrs ago   left wrist skin cancer area removed      5-6 yrs ago per pt on 06-14-2021   TUBAL LIGATION     yrs ago   UPPER GI ENDOSCOPY  06/24/2019   Family History  Problem Relation Age of Onset   Heart disease Mother        pacemaker   Hypertension Mother    Depression Mother    Diabetes Mother    Dementia Mother    Cancer Father 60       lung, smoker   Seizures Brother    Colon cancer Neg Hx    Esophageal cancer Neg Hx    Stomach cancer Neg Hx    Social History   Tobacco Use   Smoking status: Never   Smokeless tobacco: Never  Vaping Use   Vaping Use: Never used  Substance Use Topics   Alcohol use: Not Currently    Alcohol/week: 1.0 standard drink of alcohol    Types: 1 Glasses of wine per week   Drug use: No   Current Outpatient Medications  Medication Sig Dispense Refill   acetaminophen (TYLENOL) 500 MG tablet Take 500-1,000 mg by mouth every 6 (six) hours as needed for mild pain or headache.      Artificial Tear Ointment (DRY EYES OP) Place 1 drop into both eyes daily as needed (Dry eye).     atorvastatin (LIPITOR) 40 MG tablet TAKE 1 TABLET ONCE DAILY AT 6 PM. (Patient taking differently: daily.) 90 tablet 3   Cholecalciferol (VITAMIN D3) 1000 units CAPS Take 1,000 Units by mouth daily.     esomeprazole (NEXIUM) 40 MG capsule TAKE ONE TABLET BY MOUTH ONCE DAILY BEFORE A MEAL 60 capsule 0   famotidine (PEPCID) 20 MG tablet Take 20 mg by mouth at bedtime.     furosemide (LASIX) 20 MG tablet Take 1 tablet (20 mg total) by mouth daily as needed for fluid. 90 tablet 3   ibuprofen (ADVIL) 400 MG tablet Take 1 tablet (400 mg total) by mouth every 6 (six) hours as needed. 30 tablet 0   levothyroxine (SYNTHROID) 25 MCG tablet Take 1 tablet (25 mcg total) by mouth daily. 90 tablet 3   nystatin-triamcinolone (MYCOLOG II) cream Apply 1 application topically daily as needed (female lesions).       PARoxetine (PAXIL) 10 MG tablet Take 1 tablet (10 mg total) by mouth daily. 90 tablet 3   Semaglutide (OZEMPIC, 1 MG/DOSE, [place]) Inject into the skin once a week. On wednesday     Semaglutide, 1 MG/DOSE, (OZEMPIC, 1 MG/DOSE,) 4 MG/3ML SOPN Inject 1 mg into the skin once a week. 12 mL 3   Semaglutide, 1 MG/DOSE, (OZEMPIC, 1 MG/DOSE,) 4 MG/3ML SOPN Inject 1 mg into the skin once a week. 3 mL 11   vitamin B-12 (CYANOCOBALAMIN) 1000 MCG tablet Take 1,000 mcg by mouth daily.     zolpidem (  AMBIEN) 10 MG tablet TAKE 1/2 TO 1 TABLET AT BEDTIME AS NEEDED FOR SLEEP. 45 tablet 3   No current facility-administered medications for this visit.   No Known Allergies   Review of Systems: All systems reviewed and negative except where noted in HPI.    No results found.  Physical Exam: BP 92/64   Pulse 70   Ht '5\' 2"'$  (1.575 m)   Wt 170 lb 4 oz (77.2 kg)   SpO2 98%   BMI 31.14 kg/m  Constitutional: Pleasant,well-developed, Caucasian female in no acute distress. HEENT: Normocephalic and atraumatic. Conjunctivae are normal. No scleral icterus. Neck supple.  Cardiovascular: Normal rate, regular rhythm.  Pulmonary/chest: Effort normal and breath sounds normal. No wheezing, rales or rhonchi. Abdominal: Soft, nondistended, nontender. Bowel sounds active throughout. There are no masses palpable. No hepatomegaly. Extremities: no edema Neurological: Alert and oriented to person place and time. Skin: Skin is warm and dry. No rashes noted. Psychiatric: Normal mood and affect. Behavior is normal.  CBC    Component Value Date/Time   WBC 5.7 01/05/2022 1107   RBC 4.01 01/05/2022 1107   HGB 12.5 01/05/2022 1107   HCT 36.1 01/05/2022 1107   PLT 275.0 01/05/2022 1107   MCV 90.2 01/05/2022 1107   MCH 31.3 08/20/2019 0439   MCHC 34.5 01/05/2022 1107   RDW 13.5 01/05/2022 1107   LYMPHSABS 1.9 01/05/2022 1107   MONOABS 0.5 01/05/2022 1107   EOSABS 0.2 01/05/2022 1107   BASOSABS 0.0 01/05/2022 1107     CMP     Component Value Date/Time   NA 140 01/05/2022 1107   K 4.0 01/05/2022 1107   CL 107 01/05/2022 1107   CO2 25 01/05/2022 1107   GLUCOSE 98 01/05/2022 1107   BUN 22 01/05/2022 1107   CREATININE 1.03 01/05/2022 1107   CALCIUM 9.5 01/05/2022 1107   PROT 6.4 01/05/2022 1107   ALBUMIN 3.9 01/05/2022 1107   AST 18 01/05/2022 1107   ALT 20 01/05/2022 1107   ALKPHOS 93 01/05/2022 1107   BILITOT 0.4 01/05/2022 1107   GFRNONAA 57 (L) 08/19/2019 0417   GFRAA >60 08/19/2019 0417     ASSESSMENT AND PLAN: 77 year old female with a history paraesophageal hernia and GERD s/p hernia repair and fundoplication in 1610 with recurrence of her reflux symptoms with radiographic and endoscopic evidence of recurrence of her hernia.  The patient does not wish to consider a revision surgery at this time.  She has not seen her surgeon since the hernia recurrence was identified.  We discussed the pathophysiology of GERD and hiatal hernias and the priniples of management of GERD symptoms.  She has been having primarily nocturnal symptoms despite not eating within 4 hours of bedtime.  She had been on Ozempic which may have been causing some delayed gastric emptying and contributing to her reflux.  Her symptoms may improve some now that she is off Ozempic. I suggested a trial of metoclopramide at bedtime for a month to see if this would make a difference.  We discussed the potential for tardive dyskinesia and we discussed the symptoms of TD that she needs to be aware of.  She was advised to stop taking Reglan if she notices any unintentional movements, tics or other neurologic symptoms.  If she has significant improvement in her symptoms after a month of Reglan, then it may be worth considering continuing longer. She should also try to eat small meals low in fat and fiber in the evenings.  I  also suggested trying Gaviscon in the evenings as an adjunctive therapy.  GERD/recurrent paraesophageal hernia - Try  Reglan 10 mg qhs #30 rf0 - Continue Nexium 40 mg PO qam - Continue Pepcid 20 mg PO qhs - Consider Gaviscon prn hs - Dietary modifications (small evening meal, low fat/fiber)    Yong Channel, Brayton Mars, MD

## 2022-05-28 DIAGNOSIS — H43813 Vitreous degeneration, bilateral: Secondary | ICD-10-CM | POA: Diagnosis not present

## 2022-05-29 DIAGNOSIS — E039 Hypothyroidism, unspecified: Secondary | ICD-10-CM | POA: Diagnosis not present

## 2022-05-29 DIAGNOSIS — R7303 Prediabetes: Secondary | ICD-10-CM | POA: Diagnosis not present

## 2022-05-29 DIAGNOSIS — R7309 Other abnormal glucose: Secondary | ICD-10-CM | POA: Diagnosis not present

## 2022-05-29 DIAGNOSIS — M899 Disorder of bone, unspecified: Secondary | ICD-10-CM | POA: Diagnosis not present

## 2022-05-29 DIAGNOSIS — Z0000 Encounter for general adult medical examination without abnormal findings: Secondary | ICD-10-CM | POA: Diagnosis not present

## 2022-05-29 DIAGNOSIS — F411 Generalized anxiety disorder: Secondary | ICD-10-CM | POA: Diagnosis not present

## 2022-05-29 DIAGNOSIS — E785 Hyperlipidemia, unspecified: Secondary | ICD-10-CM | POA: Diagnosis not present

## 2022-05-29 DIAGNOSIS — K219 Gastro-esophageal reflux disease without esophagitis: Secondary | ICD-10-CM | POA: Diagnosis not present

## 2022-06-19 DIAGNOSIS — N9089 Other specified noninflammatory disorders of vulva and perineum: Secondary | ICD-10-CM | POA: Diagnosis not present

## 2022-06-19 DIAGNOSIS — Z01419 Encounter for gynecological examination (general) (routine) without abnormal findings: Secondary | ICD-10-CM | POA: Diagnosis not present

## 2022-06-19 DIAGNOSIS — N959 Unspecified menopausal and perimenopausal disorder: Secondary | ICD-10-CM | POA: Diagnosis not present

## 2022-06-19 DIAGNOSIS — Z124 Encounter for screening for malignant neoplasm of cervix: Secondary | ICD-10-CM | POA: Diagnosis not present

## 2022-06-19 DIAGNOSIS — Z1151 Encounter for screening for human papillomavirus (HPV): Secondary | ICD-10-CM | POA: Diagnosis not present

## 2022-06-26 DIAGNOSIS — R7301 Impaired fasting glucose: Secondary | ICD-10-CM | POA: Diagnosis not present

## 2022-06-26 DIAGNOSIS — N1831 Chronic kidney disease, stage 3a: Secondary | ICD-10-CM | POA: Diagnosis not present

## 2022-06-26 DIAGNOSIS — E785 Hyperlipidemia, unspecified: Secondary | ICD-10-CM | POA: Diagnosis not present

## 2022-06-28 ENCOUNTER — Other Ambulatory Visit: Payer: Self-pay | Admitting: Family Medicine

## 2022-07-06 DIAGNOSIS — H04123 Dry eye syndrome of bilateral lacrimal glands: Secondary | ICD-10-CM | POA: Diagnosis not present

## 2022-07-12 DIAGNOSIS — L292 Pruritus vulvae: Secondary | ICD-10-CM | POA: Diagnosis not present

## 2022-07-18 DIAGNOSIS — E669 Obesity, unspecified: Secondary | ICD-10-CM | POA: Diagnosis not present

## 2022-07-19 ENCOUNTER — Telehealth: Payer: Self-pay | Admitting: Gastroenterology

## 2022-07-19 ENCOUNTER — Other Ambulatory Visit: Payer: Self-pay

## 2022-07-19 DIAGNOSIS — N959 Unspecified menopausal and perimenopausal disorder: Secondary | ICD-10-CM | POA: Diagnosis not present

## 2022-07-19 MED ORDER — ESOMEPRAZOLE MAGNESIUM 40 MG PO CPDR: DELAYED_RELEASE_CAPSULE | ORAL | 1 refills | Status: DC

## 2022-07-19 NOTE — Telephone Encounter (Signed)
Inbound call from pharmacy requesting medication refill for nexium. Please advise.

## 2022-07-19 NOTE — Telephone Encounter (Signed)
Refill sent to patients pharmacy. 

## 2022-07-23 DIAGNOSIS — M25561 Pain in right knee: Secondary | ICD-10-CM | POA: Diagnosis not present

## 2022-08-02 DIAGNOSIS — M25561 Pain in right knee: Secondary | ICD-10-CM | POA: Diagnosis not present

## 2022-08-06 DIAGNOSIS — M25561 Pain in right knee: Secondary | ICD-10-CM | POA: Diagnosis not present

## 2022-08-17 DIAGNOSIS — N904 Leukoplakia of vulva: Secondary | ICD-10-CM | POA: Diagnosis not present

## 2022-09-18 DIAGNOSIS — M7061 Trochanteric bursitis, right hip: Secondary | ICD-10-CM | POA: Diagnosis not present

## 2022-09-18 DIAGNOSIS — M25561 Pain in right knee: Secondary | ICD-10-CM | POA: Diagnosis not present

## 2022-09-19 DIAGNOSIS — H04123 Dry eye syndrome of bilateral lacrimal glands: Secondary | ICD-10-CM | POA: Diagnosis not present

## 2022-10-16 DIAGNOSIS — M9901 Segmental and somatic dysfunction of cervical region: Secondary | ICD-10-CM | POA: Diagnosis not present

## 2022-10-16 DIAGNOSIS — M5411 Radiculopathy, occipito-atlanto-axial region: Secondary | ICD-10-CM | POA: Diagnosis not present

## 2022-11-08 ENCOUNTER — Other Ambulatory Visit (HOSPITAL_COMMUNITY): Payer: Self-pay

## 2022-11-20 ENCOUNTER — Other Ambulatory Visit: Payer: Self-pay | Admitting: Gastroenterology

## 2022-11-22 DIAGNOSIS — R7301 Impaired fasting glucose: Secondary | ICD-10-CM | POA: Diagnosis not present

## 2022-11-22 DIAGNOSIS — E785 Hyperlipidemia, unspecified: Secondary | ICD-10-CM | POA: Diagnosis not present

## 2022-11-22 DIAGNOSIS — E039 Hypothyroidism, unspecified: Secondary | ICD-10-CM | POA: Diagnosis not present

## 2022-11-22 DIAGNOSIS — N1831 Chronic kidney disease, stage 3a: Secondary | ICD-10-CM | POA: Diagnosis not present

## 2022-11-22 DIAGNOSIS — E538 Deficiency of other specified B group vitamins: Secondary | ICD-10-CM | POA: Diagnosis not present

## 2022-11-29 DIAGNOSIS — F419 Anxiety disorder, unspecified: Secondary | ICD-10-CM | POA: Diagnosis not present

## 2022-11-29 DIAGNOSIS — K219 Gastro-esophageal reflux disease without esophagitis: Secondary | ICD-10-CM | POA: Diagnosis not present

## 2022-11-29 DIAGNOSIS — Z0000 Encounter for general adult medical examination without abnormal findings: Secondary | ICD-10-CM | POA: Diagnosis not present

## 2022-11-29 DIAGNOSIS — R7301 Impaired fasting glucose: Secondary | ICD-10-CM | POA: Diagnosis not present

## 2022-11-29 DIAGNOSIS — Z1331 Encounter for screening for depression: Secondary | ICD-10-CM | POA: Diagnosis not present

## 2022-11-29 DIAGNOSIS — E669 Obesity, unspecified: Secondary | ICD-10-CM | POA: Diagnosis not present

## 2022-11-29 DIAGNOSIS — E785 Hyperlipidemia, unspecified: Secondary | ICD-10-CM | POA: Diagnosis not present

## 2022-11-29 DIAGNOSIS — N183 Chronic kidney disease, stage 3 unspecified: Secondary | ICD-10-CM | POA: Diagnosis not present

## 2022-11-29 DIAGNOSIS — M85859 Other specified disorders of bone density and structure, unspecified thigh: Secondary | ICD-10-CM | POA: Diagnosis not present

## 2022-11-29 DIAGNOSIS — Z1339 Encounter for screening examination for other mental health and behavioral disorders: Secondary | ICD-10-CM | POA: Diagnosis not present

## 2022-11-29 DIAGNOSIS — Z6832 Body mass index (BMI) 32.0-32.9, adult: Secondary | ICD-10-CM | POA: Diagnosis not present

## 2022-11-29 DIAGNOSIS — Z86010 Personal history of colonic polyps: Secondary | ICD-10-CM | POA: Diagnosis not present

## 2022-12-05 ENCOUNTER — Encounter: Payer: Self-pay | Admitting: Gastroenterology

## 2022-12-11 DIAGNOSIS — M25561 Pain in right knee: Secondary | ICD-10-CM | POA: Diagnosis not present

## 2022-12-23 ENCOUNTER — Other Ambulatory Visit: Payer: Self-pay | Admitting: Family Medicine

## 2022-12-25 ENCOUNTER — Other Ambulatory Visit (HOSPITAL_BASED_OUTPATIENT_CLINIC_OR_DEPARTMENT_OTHER): Payer: Self-pay

## 2022-12-25 ENCOUNTER — Encounter (HOSPITAL_BASED_OUTPATIENT_CLINIC_OR_DEPARTMENT_OTHER): Payer: Self-pay

## 2022-12-25 DIAGNOSIS — E669 Obesity, unspecified: Secondary | ICD-10-CM | POA: Diagnosis not present

## 2022-12-25 DIAGNOSIS — E785 Hyperlipidemia, unspecified: Secondary | ICD-10-CM | POA: Diagnosis not present

## 2022-12-25 DIAGNOSIS — R7301 Impaired fasting glucose: Secondary | ICD-10-CM | POA: Diagnosis not present

## 2022-12-25 DIAGNOSIS — N1831 Chronic kidney disease, stage 3a: Secondary | ICD-10-CM | POA: Diagnosis not present

## 2022-12-25 MED ORDER — ZEPBOUND 5 MG/0.5ML ~~LOC~~ SOAJ
5.0000 mg | SUBCUTANEOUS | 5 refills | Status: DC
  Filled 2022-12-25 – 2023-01-21 (×2): qty 2, 28d supply, fill #0

## 2022-12-25 MED ORDER — ZEPBOUND 2.5 MG/0.5ML ~~LOC~~ SOAJ
2.5000 mg | SUBCUTANEOUS | 0 refills | Status: DC
Start: 2022-12-25 — End: 2023-01-29
  Filled 2022-12-25 (×2): qty 2, 28d supply, fill #0

## 2022-12-27 ENCOUNTER — Other Ambulatory Visit (HOSPITAL_BASED_OUTPATIENT_CLINIC_OR_DEPARTMENT_OTHER): Payer: Self-pay

## 2022-12-28 ENCOUNTER — Other Ambulatory Visit (HOSPITAL_BASED_OUTPATIENT_CLINIC_OR_DEPARTMENT_OTHER): Payer: Self-pay

## 2023-01-08 ENCOUNTER — Other Ambulatory Visit (HOSPITAL_BASED_OUTPATIENT_CLINIC_OR_DEPARTMENT_OTHER): Payer: Self-pay

## 2023-01-16 IMAGING — RF DG ESOPHAGUS
10 of 13 series · 14 of 24 positions shown · non-contrast
Comparison: Esophagram dated February 15, 2020.

CLINICAL DATA: Patient with history of dysphagia, known hiatal
hernia with prior repair and acid reflux. Now with worsening
dysphagia. Patient presents for double esophogram with tablet.

EXAM:
ESOPHAGUS/BARIUM SWALLOW/TABLET STUDY
TECHNIQUE: Combined double and single contrast examination was performed using
effervescent crystals, high-density barium, and thin liquid barium.
This exam was performed by APP name, and was supervised and
interpreted by Rad name.
FLUOROSCOPY:
Radiation Exposure Index (as provided by the fluoroscopic device):
15.2 mGy Kerma

[Series 1: cp_standard · 0.52mm/px · 2 of 107 frames shown (1 of 9)]
[frame 8/107]
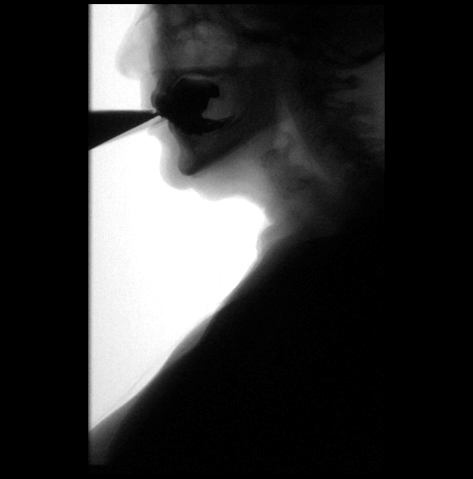
[frame 91/107]
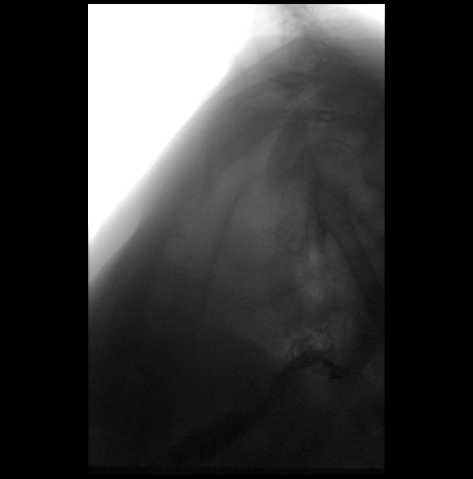

[Series 2: cp_standard · 0.52mm/px · 1 of 77 frames shown (2 of 9)]
[frame 66/77]
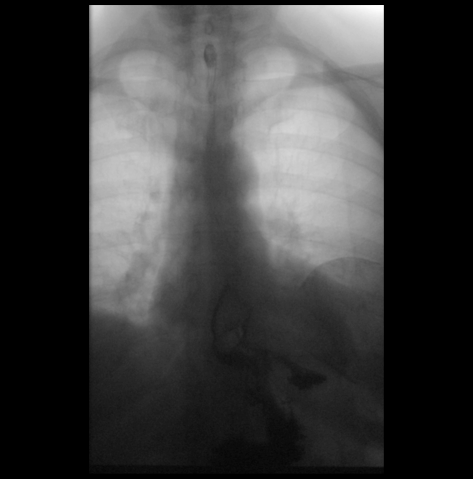

[Series 3: cp_standard · 0.52mm/px · 1 of 85 frames shown (3 of 9)]
[frame 43/85]
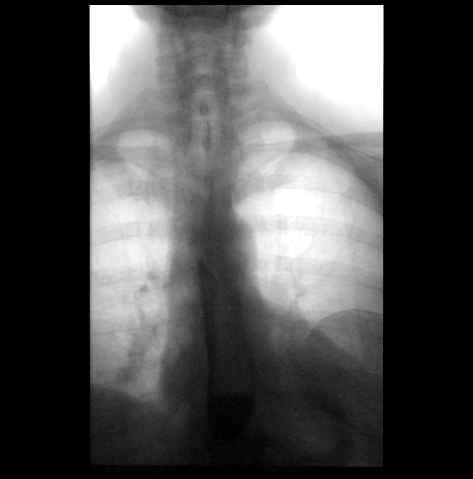

[Series 4: cp_standard · 0.54mm/px · 2 of 78 frames shown (4 of 9)]
[frame 12/78]
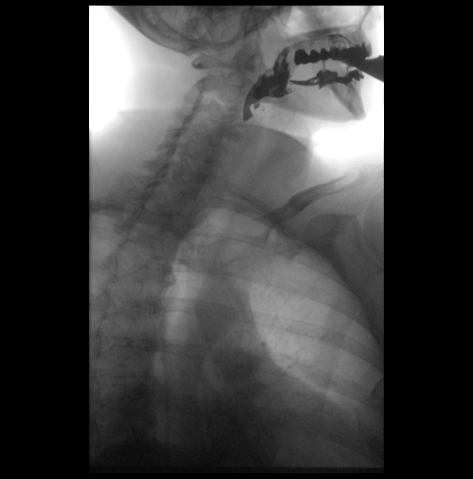
[frame 67/78]
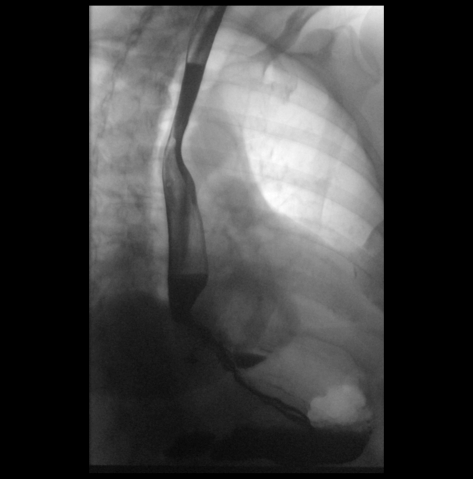

[Series 6: fluoro_barium 2fps_bw · 0.18mm/px · 2 of 4 frames shown]
[frame 3/4]
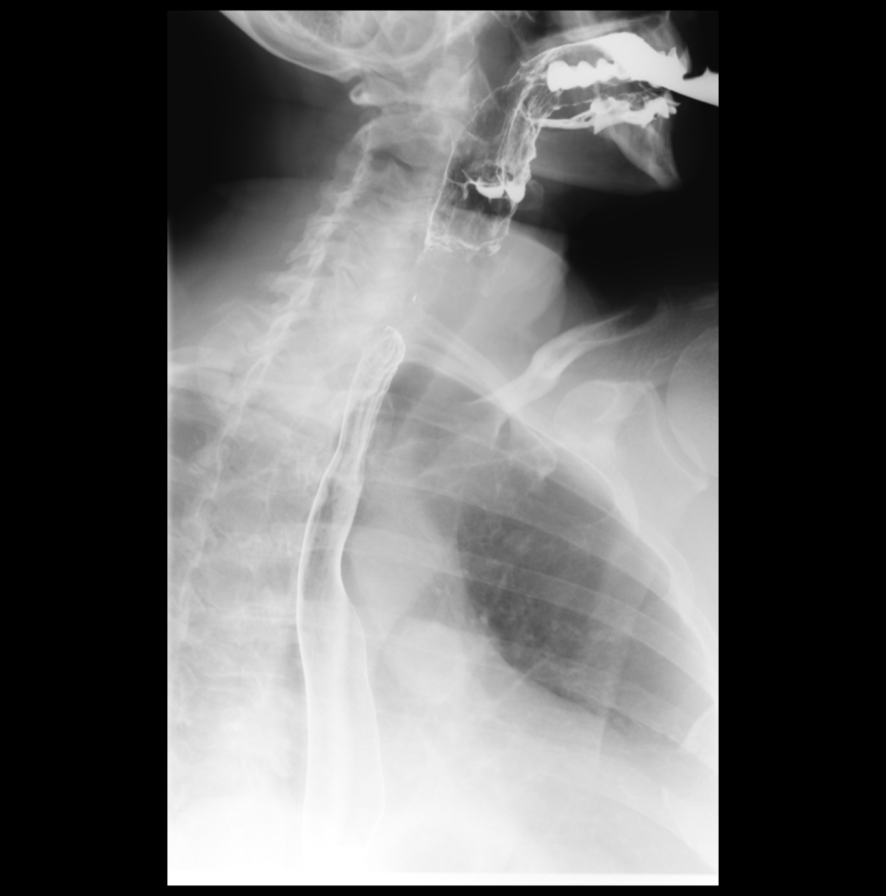
[frame 4/4]
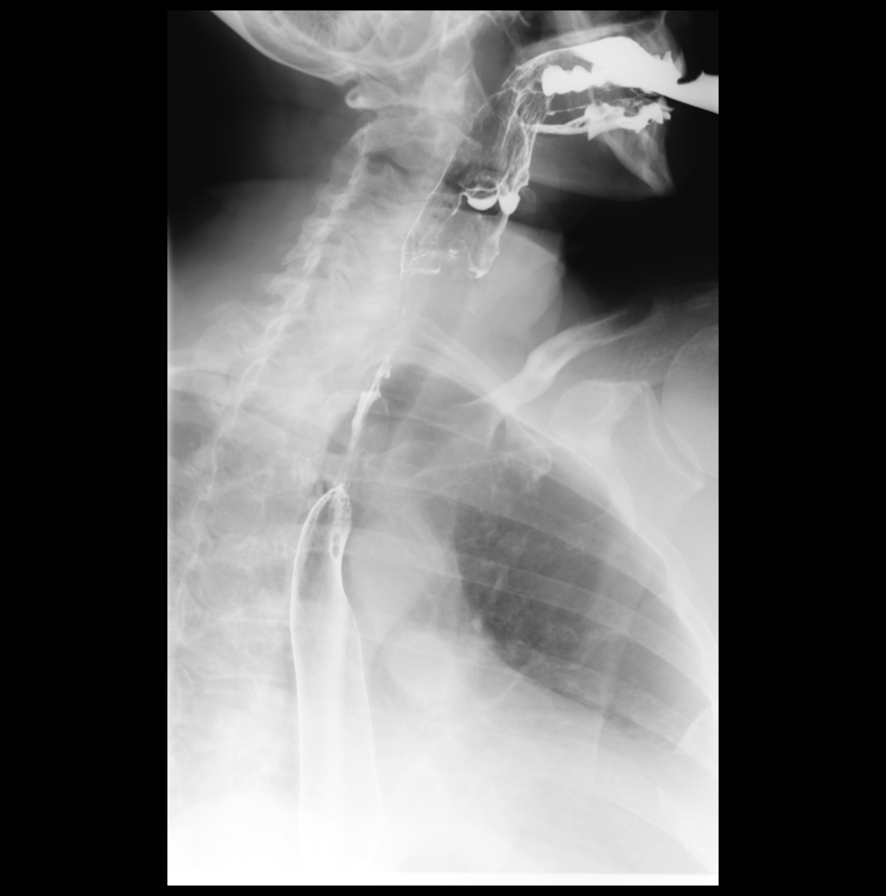

[Series 7: cp_standard · 0.54mm/px · 1 of 65 frames shown (5 of 9)]
[frame 56/65]
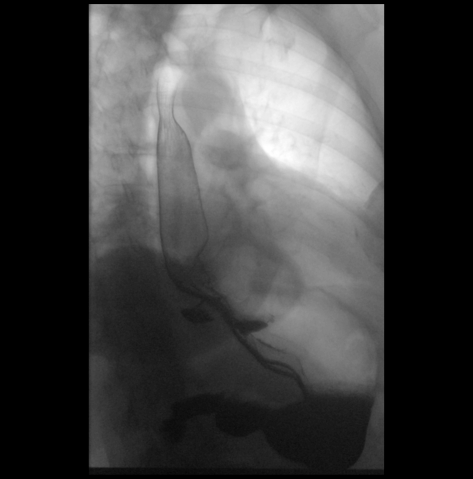

[Series 9: cp_standard · 0.55mm/px · 1 of 107 frames shown (6 of 9)]
[frame 54/107]
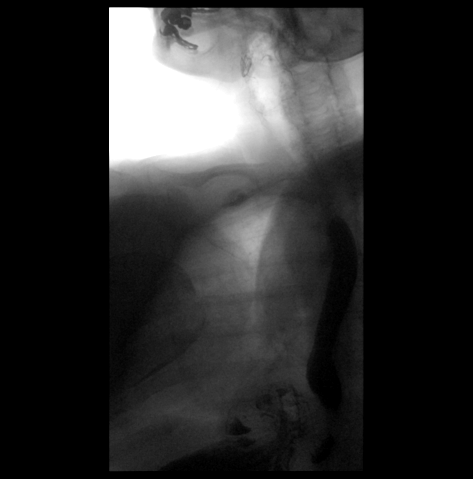

[Series 10: cp_standard · 0.57mm/px · 2 of 190 frames shown (7 of 9)]
[frame 29/190]
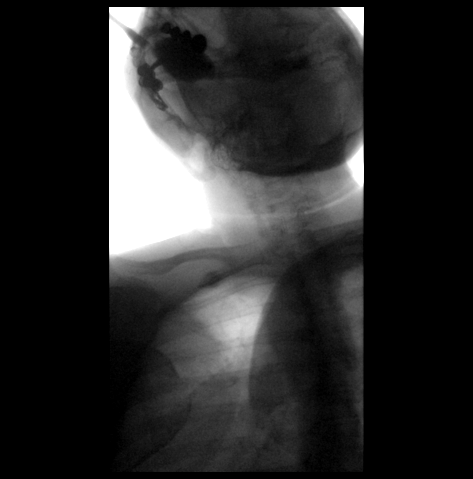
[frame 96/190]
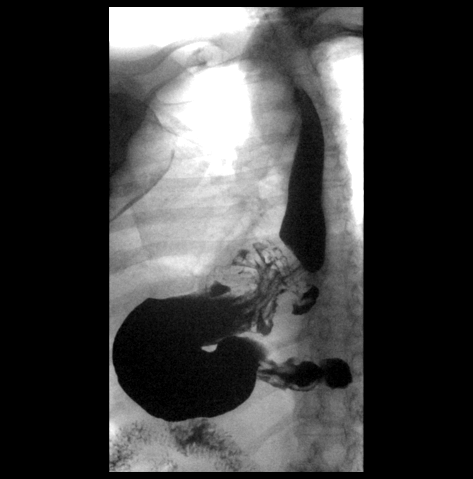

[Series 13: cp_standard · 0.54mm/px · 1 of 103 frames shown (8 of 9)]
[frame 22/103]
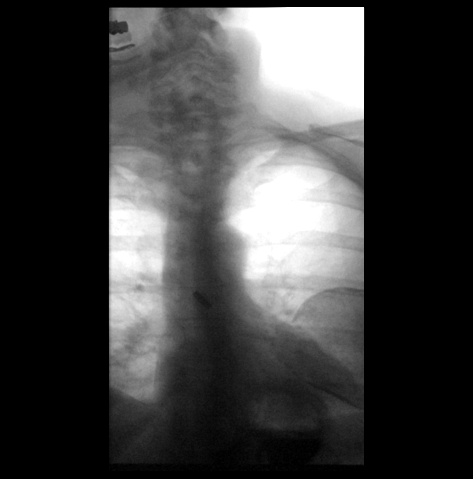

[Series 14: cp_standard · 0.27mm/px · 1 of 1 slices shown (9 of 9)]
[im 1/1]
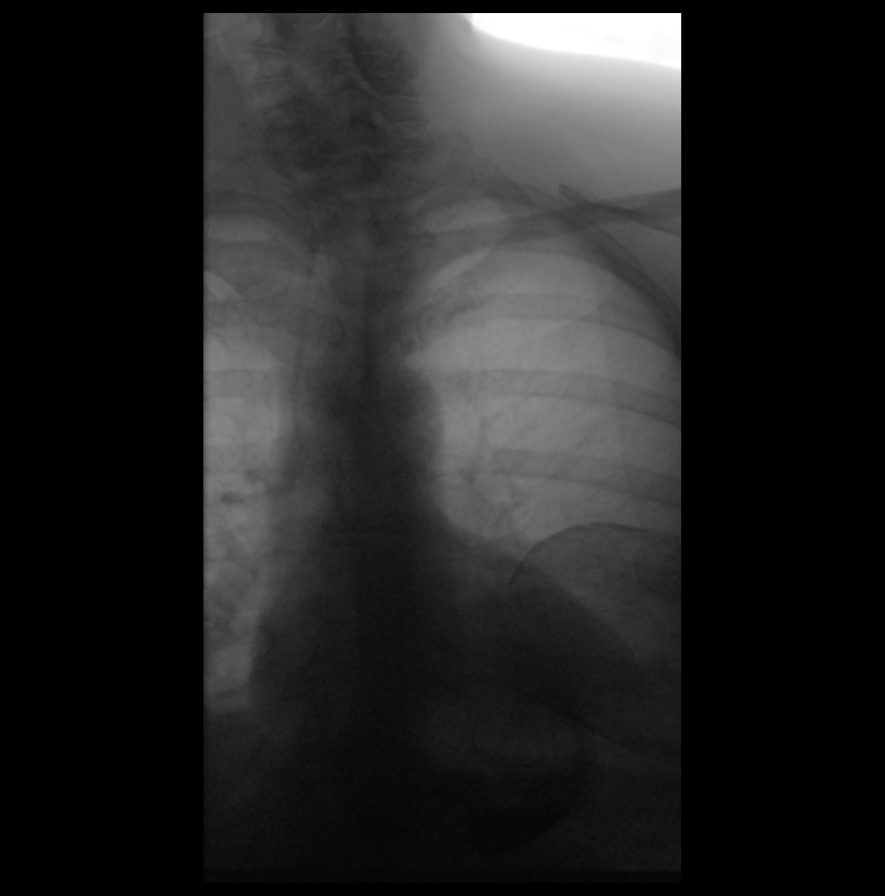

[14 of 24 positions shown; findings below may reference images not displayed]

FINDINGS: Swallowing: Appears normal. No vestibular penetration or aspiration
seen.

Pharynx: Unremarkable.

Esophagus: No mass or stricture is noted. Possible reflux
esophagitis is noted.

Esophageal motility: Within normal limits.

Hiatal Hernia: Hiatal hernia. Enlarged compared to esophogram from
1[REDACTED]. With possible pulsion diverticulum or surgical scarring
noted.

Gastroesophageal reflux: Moderate gastroesophageal reflux noted.

Ingested 13mm barium tablet: Passed normally

Other: None.
IMPRESSION: 1. Enlarging hiatal hernia with possible pulsion diverticulum or
surgical scarring.

2. No mass. Spontaneous gastroesophageal reflux noted with probable
reflux esophagitis as noted on prior exam.

3.  Otherwise unremarkable esophagram as described.

This exam was performed by Jonsteinn Agaetisson NP under the direct
supervision of Dr. Levani Purtseladze Serdarova

## 2023-01-21 ENCOUNTER — Other Ambulatory Visit (HOSPITAL_BASED_OUTPATIENT_CLINIC_OR_DEPARTMENT_OTHER): Payer: Self-pay

## 2023-01-22 DIAGNOSIS — D120 Benign neoplasm of cecum: Secondary | ICD-10-CM | POA: Diagnosis not present

## 2023-01-22 DIAGNOSIS — D123 Benign neoplasm of transverse colon: Secondary | ICD-10-CM | POA: Diagnosis not present

## 2023-01-22 DIAGNOSIS — M545 Low back pain, unspecified: Secondary | ICD-10-CM | POA: Diagnosis not present

## 2023-01-22 DIAGNOSIS — M25561 Pain in right knee: Secondary | ICD-10-CM | POA: Diagnosis not present

## 2023-01-29 ENCOUNTER — Encounter: Payer: Self-pay | Admitting: Gastroenterology

## 2023-01-29 ENCOUNTER — Ambulatory Visit (AMBULATORY_SURGERY_CENTER): Payer: Medicare Other

## 2023-01-29 VITALS — Ht 62.0 in | Wt 175.0 lb

## 2023-01-29 DIAGNOSIS — Z860101 Personal history of adenomatous and serrated colon polyps: Secondary | ICD-10-CM

## 2023-01-29 MED ORDER — NA SULFATE-K SULFATE-MG SULF 17.5-3.13-1.6 GM/177ML PO SOLN: 1.0000 | Freq: Once | ORAL | 0 refills | Status: AC

## 2023-01-29 NOTE — Progress Notes (Signed)
Pre visit completed via phone call; Patient verified name, DOB, and address; No egg or soy allergy known to patient;  No issues known to pt with past sedation with any surgeries or procedures; Patient denies ever being told they had issues or difficulty with intubation;  No FH of Malignant Hyperthermia; Pt is not on diet pills; Pt is not on home 02;  Pt is not on blood thinners;  Pt denies issues with constipation;  No A fib or A flutter; Have any cardiac testing pending--NO Insurance verified during PV appt--- Medicare Pt can ambulate without assistance;  Pt denies use of chewing tobacco; Discussed diabetic/weight loss medication holds; Discussed NSAID holds; Checked BMI to be less than 50; Pt instructed to use Single[employer] or GoodRx for a price reduction on prep; Patient's chart reviewed by Cathlyn Parsons CNRA prior to previsit and patient appropriate for the [facility];  Pre visit completed and red dot placed by patient's name on their procedure day (on provider's schedule);    Instructions sent to MyChart per patient request;

## 2023-02-14 DIAGNOSIS — K219 Gastro-esophageal reflux disease without esophagitis: Secondary | ICD-10-CM | POA: Diagnosis not present

## 2023-02-14 DIAGNOSIS — K449 Diaphragmatic hernia without obstruction or gangrene: Secondary | ICD-10-CM | POA: Diagnosis not present

## 2023-02-22 ENCOUNTER — Ambulatory Visit (AMBULATORY_SURGERY_CENTER): Payer: Medicare Other | Admitting: Gastroenterology

## 2023-02-22 ENCOUNTER — Encounter: Payer: Self-pay | Admitting: Gastroenterology

## 2023-02-22 VITALS — BP 124/76 | HR 65 | Temp 98.4°F | Resp 13 | Ht 62.0 in | Wt 175.0 lb

## 2023-02-22 DIAGNOSIS — Z860101 Personal history of adenomatous and serrated colon polyps: Secondary | ICD-10-CM | POA: Diagnosis not present

## 2023-02-22 DIAGNOSIS — E039 Hypothyroidism, unspecified: Secondary | ICD-10-CM | POA: Diagnosis not present

## 2023-02-22 DIAGNOSIS — D120 Benign neoplasm of cecum: Secondary | ICD-10-CM

## 2023-02-22 DIAGNOSIS — D123 Benign neoplasm of transverse colon: Secondary | ICD-10-CM | POA: Diagnosis not present

## 2023-02-22 DIAGNOSIS — F419 Anxiety disorder, unspecified: Secondary | ICD-10-CM | POA: Diagnosis not present

## 2023-02-22 DIAGNOSIS — N183 Chronic kidney disease, stage 3 unspecified: Secondary | ICD-10-CM | POA: Diagnosis not present

## 2023-02-22 DIAGNOSIS — Z09 Encounter for follow-up examination after completed treatment for conditions other than malignant neoplasm: Secondary | ICD-10-CM | POA: Diagnosis not present

## 2023-02-22 DIAGNOSIS — G4730 Sleep apnea, unspecified: Secondary | ICD-10-CM | POA: Diagnosis not present

## 2023-02-22 DIAGNOSIS — E785 Hyperlipidemia, unspecified: Secondary | ICD-10-CM | POA: Diagnosis not present

## 2023-02-22 MED ORDER — SODIUM CHLORIDE 0.9 % IV SOLN: 500.0000 mL | Freq: Once | INTRAVENOUS | Status: DC

## 2023-02-22 NOTE — Progress Notes (Signed)
Gastroenterology History and Physical   Primary Care Physician:  Melida Quitter, MD   Reason for Procedure:   History of colon polyps  Plan:    Surveillance colonoscopy     HPI: Megan Booth is a 77 y.o. female undergoing surveillance colonoscopy.  She has no family history of colon cancer and no chronic lower GI symptoms.  Her last colonoscopy was in Mar 2021 by Dr. Christella Hartigan and 3 small polyps were removed in the ascending colon (2 adenomas, 1 SSP).   Past Medical History:  Diagnosis Date   Anxiety    Barrett esophagus    Bursitis of right hip    injection with dr Juliene Pina 06-13-2021   Cancer [facility])    left wrist skin cancer   Chronic kidney disease Stage 3    Diverticulosis    GERD (gastroesophageal reflux disease)    history of Amnesia, global, transient 2015   see hospital notes   history of right foot Tendonitis 07/15/2019   wears shoe inserts   Hx of colonic polyps    Dr. Virginia Rochester    Hyperlipidemia    Hypothyroidism    Leukoplakia, vulva    Lower back pain    Personal history of COVID-19 03/2021   mild symptoms took paxlovid all symptoms resolved   PMB (postmenopausal bleeding) 06/14/2021   Pre-diabetes    Sleep apnea    could not tolerate cpap mild osa per sleep study 5 -6 yrs ago per pt 06-14-2021   Wears partial denture lower     Past Surgical History:  Procedure Laterality Date   BREAST ENHANCEMENT SURGERY Bilateral    40 yrs ago   COLONOSCOPY  2021   DJ-MAC-suprep(good)-tics/TA/SSP   DILATATION & CURETTAGE/HYSTEROSCOPY WITH MYOSURE N/A 06/16/2021   Procedure: DILATATION & CURETTAGE/HYSTEROSCOPY WITH MYOSURE;  Surgeon: Carlisle Cater, MD;  Location: [facility];  Service: Gynecology;  Laterality: N/A;   DILATION AND CURETTAGE OF UTERUS     x 2 last 2020   HIATAL HERNIA REPAIR N/A 08/18/2019   Procedure: LAPAROSCOPIC PARAESOPHAGEAL HERNIA REPAIR WITH FUNDOPLICATION;  Surgeon: Kinsinger, De Blanch, MD;  Location: [facility];  Service:  General;  Laterality: N/A;   HYSTEROSCOPY     last done 2 yrs ago   LASIK Bilateral    yrs ago   left wrist skin cancer area removed      5-6 yrs ago per pt on 06-14-2021   POLYPECTOMY  2021   TA/SSP   TUBAL LIGATION     yrs ago   UPPER GI ENDOSCOPY  06/24/2019    Prior to Admission medications   Medication Sig Start Date End Date Taking? Authorizing Provider  acetaminophen (TYLENOL) 500 MG tablet Take 500-1,000 mg by mouth every 6 (six) hours as needed for mild pain or headache.    Yes [provider]  Artificial Tear Ointment (DRY EYES OP) Place 1 drop into both eyes daily as needed (Dry eye).   Yes [provider]  esomeprazole (NEXIUM) 40 MG capsule TAKE ONE TABLET BY MOUTH ONCE DAILY BEFORE A MEAL 11/20/22  Yes Jenel Lucks, MD  FAMOTIDINE PO Take 20 mg by mouth at bedtime. PEPCID EXTRA STRENGTH   Yes [provider]  furosemide (LASIX) 20 MG tablet Take 1 tablet (20 mg total) by mouth daily as needed for fluid. 10/27/20  Yes Shelva Majestic, MD  levothyroxine (SYNTHROID) 25 MCG tablet Take 1 tablet (25 mcg total) by mouth daily. 01/02/23  Yes Hunter,  Aldine Contes, MD  PARoxetine (PAXIL) 10 MG tablet Take 1 tablet (10 mg total) by mouth daily. 10/27/20  Yes Shelva Majestic, MD  VEOZAH 45 MG TABS Take 1 tablet by mouth daily.   Yes [provider]  zolpidem (AMBIEN) 10 MG tablet TAKE 1/2 TO 1 TABLET AT BEDTIME AS NEEDED FOR SLEEP. 11/29/20  Yes Shelva Majestic, MD  atorvastatin (LIPITOR) 40 MG tablet TAKE 1 TABLET ONCE DAILY AT 6 PM. Patient not taking: Reported on 01/29/2023 10/27/20   Shelva Majestic, MD  Azelastine HCl 137 MCG/SPRAY SOLN Place 1 spray into the nose daily as needed. Patient not taking: Reported on 02/22/2023    [provider]  celecoxib (CELEBREX) 200 MG capsule Take 200 mg by mouth daily. Patient not taking: Reported on 02/22/2023    [provider]  Cholecalciferol (VITAMIN D3) 1000 units CAPS Take 1,000  Units by mouth daily. Patient not taking: Reported on 01/29/2023    [provider]  nystatin-triamcinolone (MYCOLOG II) cream Apply 1 application topically daily as needed (female lesions).  Patient not taking: Reported on 02/22/2023 08/10/19   [provider]  predniSONE (DELTASONE) 10 MG tablet Take 10 mg by mouth. Last dose per pt is to be on 02/04/2023 Patient not taking: Reported on 02/22/2023    [provider]  SEMAGLUTIDE,0.25 OR 0.5MG /DOS, [place] Inject 20 Units/day into the skin once a week.    [provider]  vitamin B-12 (CYANOCOBALAMIN) 1000 MCG tablet Take 1,000 mcg by mouth daily. Patient not taking: Reported on 02/22/2023    [provider]  omeprazole (PRILOSEC) 40 MG capsule Take 1 capsule (40 mg total) by mouth 2 (two) times daily before a meal. 10/02/21 10/27/21  Doree Albee, PA-C    Current Outpatient Medications  Medication Sig Dispense Refill   acetaminophen (TYLENOL) 500 MG tablet Take 500-1,000 mg by mouth every 6 (six) hours as needed for mild pain or headache.      Artificial Tear Ointment (DRY EYES OP) Place 1 drop into both eyes daily as needed (Dry eye).     esomeprazole (NEXIUM) 40 MG capsule TAKE ONE TABLET BY MOUTH ONCE DAILY BEFORE A MEAL 60 capsule 1   FAMOTIDINE PO Take 20 mg by mouth at bedtime. PEPCID EXTRA STRENGTH     furosemide (LASIX) 20 MG tablet Take 1 tablet (20 mg total) by mouth daily as needed for fluid. 90 tablet 3   levothyroxine (SYNTHROID) 25 MCG tablet Take 1 tablet (25 mcg total) by mouth daily. 90 tablet 3   PARoxetine (PAXIL) 10 MG tablet Take 1 tablet (10 mg total) by mouth daily. 90 tablet 3   VEOZAH 45 MG TABS Take 1 tablet by mouth daily.     zolpidem (AMBIEN) 10 MG tablet TAKE 1/2 TO 1 TABLET AT BEDTIME AS NEEDED FOR SLEEP. 45 tablet 3   atorvastatin (LIPITOR) 40 MG tablet TAKE 1 TABLET ONCE DAILY AT 6 PM. (Patient not taking: Reported on 01/29/2023) 90 tablet 3   Azelastine HCl 137 MCG/SPRAY  SOLN Place 1 spray into the nose daily as needed. (Patient not taking: Reported on 02/22/2023)     celecoxib (CELEBREX) 200 MG capsule Take 200 mg by mouth daily. (Patient not taking: Reported on 02/22/2023)     Cholecalciferol (VITAMIN D3) 1000 units CAPS Take 1,000 Units by mouth daily. (Patient not taking: Reported on 01/29/2023)     nystatin-triamcinolone (MYCOLOG II) cream Apply 1 application topically daily as needed (female lesions).  (  Patient not taking: Reported on 02/22/2023)     predniSONE (DELTASONE) 10 MG tablet Take 10 mg by mouth. Last dose per pt is to be on 02/04/2023 (Patient not taking: Reported on 02/22/2023)     SEMAGLUTIDE,0.25 OR 0.5MG /DOS, [place] Inject 20 Units/day into the skin once a week.     vitamin B-12 (CYANOCOBALAMIN) 1000 MCG tablet Take 1,000 mcg by mouth daily. (Patient not taking: Reported on 02/22/2023)     Current Facility-Administered Medications  Medication Dose Route Frequency Provider Last Rate Last Admin   0.9 %  sodium chloride infusion  500 mL Intravenous Once Jenel Lucks, MD        Allergies as of 02/22/2023   (No Known Allergies)    Family History  Problem Relation Age of Onset   Heart disease Mother        pacemaker   Hypertension Mother    Depression Mother    Diabetes Mother    Dementia Mother    Cancer Father 53       lung, smoker   Seizures Brother    Colon cancer Neg Hx    Esophageal cancer Neg Hx    Stomach cancer Neg Hx    Pancreatic cancer Neg Hx    Colon polyps Neg Hx     Social History   Socioeconomic History   Marital status: Married    Spouse name: Not on file   Number of children: 0   Years of education: Not on file   Highest education level: Not on file  Occupational History   Occupation: Systems analyst -retired    Associate Professor: [employer]  Tobacco Use   Smoking status: Never   Smokeless tobacco: Never  Vaping Use   Vaping status: Never Used  Substance and Sexual Activity   Alcohol use: Not  Currently    Alcohol/week: 1.0 standard drink of alcohol    Types: 1 Glasses of wine per week   Drug use: No   Sexual activity: Never    Birth control/protection: Surgical    Comment: BTL  Other Topics Concern   Not on file  Social History Narrative   Married (Husband with Dr. Jimmey Ralph she thinks), no children.       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  Physical Exam: Vital signs BP 132/85   Pulse 73   Temp 98.4 F (36.9 C) (Skin)   Ht 5\' 2"  (1.575 m)   Wt 175 lb (79.4 kg)   SpO2 96%   BMI 32.01 kg/m   General:   Alert,  Well-developed, well-nourished, pleasant and cooperative in NAD Airway:  Mallampati 1 Lungs:  Clear throughout to auscultation.   Heart:  Regular rate and rhythm; no murmurs, clicks, rubs,  or gallops. Abdomen:  Soft, nontender and nondistended. Normal bowel sounds.   Neuro/Psych:  Normal mood and affect. A and O x 3   Munirah Doerner E. Tomasa Rand, MD [facility] Gastroenterology

## 2023-02-22 NOTE — Patient Instructions (Signed)

## 2023-02-22 NOTE — Progress Notes (Signed)
Pt's states no medical or surgical changes since previsit or office visit. 

## 2023-02-22 NOTE — Progress Notes (Signed)
Called to room to assist during endoscopic procedure.  Patient ID and intended procedure confirmed with present staff. Received instructions for my participation in the procedure from the performing physician.  

## 2023-02-22 NOTE — Op Note (Signed)
[facility] Patient Name: Megan Booth Procedure Date: 02/22/2023 12:17 PM MRN: [id number] Endoscopist: Lorin Picket E. Tomasa Rand , MD, [id number] Age: 77 Referring MD:  Date of Birth: December 04, 1945 Gender: Female Account #: [id number] Procedure:                Colonoscopy Indications:              Surveillance: Personal history of adenomatous                            polyps on last colonoscopy 3 years ago Medicines:                Monitored Anesthesia Care Procedure:                Pre-Anesthesia Assessment:                           - Prior to the procedure, a History and Physical                            was performed, and patient medications and                            allergies were reviewed. The patient's tolerance of                            previous anesthesia was also reviewed. The risks                            and benefits of the procedure and the sedation                            options and risks were discussed with the patient.                            All questions were answered, and informed consent                            was obtained. Prior Anticoagulants: The patient has                            taken no anticoagulant or antiplatelet agents. ASA                            Grade Assessment: III - A patient with severe                            systemic disease. After reviewing the risks and                            benefits, the patient was deemed in satisfactory                            condition to undergo the procedure.  After obtaining informed consent, the colonoscope                            was passed under direct vision. Throughout the                            procedure, the patient's blood pressure, pulse, and                            oxygen saturations were monitored continuously. The                            Olympus Scope SN: [id number] was introduced through                            the anus and  advanced to the the cecum, identified                            by appendiceal orifice and ileocecal valve. The                            colonoscopy was performed without difficulty. The                            patient tolerated the procedure well. The quality                            of the bowel preparation was good. The ileocecal                            valve, appendiceal orifice, and rectum were                            photographed. The bowel preparation used was SUPREP                            via split dose instruction. Scope In: 12:24:36 PM Scope Out: 12:42:02 PM Scope Withdrawal Time: 0 hours 8 minutes 17 seconds  Total Procedure Duration: 0 hours 17 minutes 26 seconds  Findings:                 The perianal and digital rectal examinations were                            normal. Pertinent negatives include normal                            sphincter tone and no palpable rectal lesions.                           Two sessile polyps were found in the cecum. The                            polyps were 2 to 3 mm in size.  These polyps were                            removed with a cold snare. Resection and retrieval                            were complete. Estimated blood loss was minimal.                           A 3 mm polyp was found in the transverse colon. The                            polyp was sessile. The polyp was removed with a                            cold snare. Resection and retrieval were complete.                            Estimated blood loss was minimal.                           Multiple medium-mouthed and small-mouthed                            diverticula were found in the sigmoid colon and                            descending colon. There was no evidence of                            diverticular bleeding.                           The exam was otherwise normal throughout the                            examined colon.                            The retroflexed view of the distal rectum and anal                            verge was normal and showed no anal or rectal                            abnormalities. Complications:            No immediate complications. Estimated Blood Loss:     Estimated blood loss was minimal. Impression:               - Two 2 to 3 mm polyps in the cecum, removed with a                            cold snare. Resected and retrieved.                           -  One 3 mm polyp in the transverse colon, removed                            with a cold snare. Resected and retrieved.                           - Moderate diverticulosis in the sigmoid colon and                            in the descending colon. There was no evidence of                            diverticular bleeding.                           - The distal rectum and anal verge are normal on                            retroflexion view. Recommendation:           - Patient has a contact number available for                            emergencies. The signs and symptoms of potential                            delayed complications were discussed with the                            patient. Return to normal activities tomorrow.                            Written discharge instructions were provided to the                            patient.                           - Resume previous diet.                           - Continue present medications.                           - Await pathology results.                           - Repeat colonoscopy (date not yet determined) for                            surveillance based on pathology results.                           - Given patient's age and lack of high risk polyps,  it would be reasonable to discontinue colon cancer                            screening Roxas Clymer E. Tomasa Rand, MD 02/22/2023 12:49:57 PM This report has been signed electronically.

## 2023-02-25 ENCOUNTER — Telehealth: Payer: Self-pay

## 2023-02-25 NOTE — Telephone Encounter (Signed)
Follow up call to pt, lm for pt to call if having any difficulty with normal activities or eating and drinking.  Also to call if any other questions or concerns.  

## 2023-02-26 DIAGNOSIS — Z1231 Encounter for screening mammogram for malignant neoplasm of breast: Secondary | ICD-10-CM | POA: Diagnosis not present

## 2023-02-26 LAB — SURGICAL PATHOLOGY

## 2023-02-28 NOTE — Progress Notes (Signed)
Megan Booth,   The three polyps that I removed during your recent procedure were completely benign but were proven to be "pre-cancerous" polyps that MAY have grown into cancers if they had not been removed.  Studies shows that at least 20% of women over age 75 and 30% of men over age 68 have pre-cancerous polyps.   Based on current guidelines, a repeat colonoscopy would be recommended in 5 years.  However, because colon cancer screening after age 53 is done on a case-by-case basis, taking into account the patient's risk factors for colon cancer, as well as comorbidities and life expectancy, I think it would be reasonable to not pursue any further colon cancer screening.  If in 5 years, you are still in excellent health and would wish to consider another colonoscopy, please make an office visit with me to discuss this.

## 2023-03-17 ENCOUNTER — Other Ambulatory Visit: Payer: Self-pay | Admitting: Gastroenterology

## 2023-03-20 ENCOUNTER — Telehealth: Payer: Self-pay | Admitting: Pharmacy Technician

## 2023-03-20 ENCOUNTER — Other Ambulatory Visit (HOSPITAL_COMMUNITY): Payer: Self-pay

## 2023-03-20 NOTE — Telephone Encounter (Signed)
Pharmacy Patient Advocate Encounter  Received notification from [facility] that Prior Authorization for ESOMEPRAZOLE 40MG  has been APPROVED from 11.27.24 to 11.27.25. Ran test claim, Copay is $9.54. This test claim was processed through [facility]- copay amounts may vary at other pharmacies due to pharmacy/plan contracts, or as the patient moves through the different stages of their insurance plan.   PA #/Case ID/Reference #: [id number]

## 2023-03-20 NOTE — Telephone Encounter (Signed)
Pharmacy Patient Advocate Encounter   Received notification from CoverMyMeds that prior authorization for ESOMEPRAZOLE 40MG  is required/requested.   Insurance verification completed.   The patient is insured through [facility] .   Per test claim: PA required; PA submitted to above mentioned insurance via CoverMyMeds Key/confirmation #/EOC [facility] Status is pending

## 2023-03-25 DIAGNOSIS — M25561 Pain in right knee: Secondary | ICD-10-CM | POA: Diagnosis not present

## 2023-04-05 DIAGNOSIS — F419 Anxiety disorder, unspecified: Secondary | ICD-10-CM | POA: Diagnosis not present

## 2023-04-05 DIAGNOSIS — R7301 Impaired fasting glucose: Secondary | ICD-10-CM | POA: Diagnosis not present

## 2023-04-05 DIAGNOSIS — E669 Obesity, unspecified: Secondary | ICD-10-CM | POA: Diagnosis not present

## 2023-04-05 DIAGNOSIS — E785 Hyperlipidemia, unspecified: Secondary | ICD-10-CM | POA: Diagnosis not present

## 2023-04-05 DIAGNOSIS — Z6831 Body mass index (BMI) 31.0-31.9, adult: Secondary | ICD-10-CM | POA: Diagnosis not present

## 2023-04-05 DIAGNOSIS — N183 Chronic kidney disease, stage 3 unspecified: Secondary | ICD-10-CM | POA: Diagnosis not present

## 2023-04-05 DIAGNOSIS — G729 Myopathy, unspecified: Secondary | ICD-10-CM | POA: Diagnosis not present

## 2023-04-08 DIAGNOSIS — M1711 Unilateral primary osteoarthritis, right knee: Secondary | ICD-10-CM | POA: Diagnosis not present

## 2023-04-09 ENCOUNTER — Other Ambulatory Visit (HOSPITAL_COMMUNITY): Payer: Self-pay

## 2023-04-09 DIAGNOSIS — N1831 Chronic kidney disease, stage 3a: Secondary | ICD-10-CM | POA: Diagnosis not present

## 2023-04-09 DIAGNOSIS — E669 Obesity, unspecified: Secondary | ICD-10-CM | POA: Diagnosis not present

## 2023-04-09 DIAGNOSIS — R7301 Impaired fasting glucose: Secondary | ICD-10-CM | POA: Diagnosis not present

## 2023-04-09 DIAGNOSIS — G4730 Sleep apnea, unspecified: Secondary | ICD-10-CM | POA: Diagnosis not present

## 2023-04-09 DIAGNOSIS — E785 Hyperlipidemia, unspecified: Secondary | ICD-10-CM | POA: Diagnosis not present

## 2023-04-09 MED ORDER — ZEPBOUND 2.5 MG/0.5ML ~~LOC~~ SOAJ
2.5000 mg | SUBCUTANEOUS | 0 refills | Status: DC
  Filled 2023-04-29: qty 2, 28d supply, fill #0

## 2023-04-09 MED ORDER — ZEPBOUND 5 MG/0.5ML ~~LOC~~ SOAJ: 5.0000 mg | SUBCUTANEOUS | 5 refills | Status: DC

## 2023-04-12 ENCOUNTER — Ambulatory Visit: Payer: Self-pay | Admitting: General Surgery

## 2023-04-15 DIAGNOSIS — M1711 Unilateral primary osteoarthritis, right knee: Secondary | ICD-10-CM | POA: Diagnosis not present

## 2023-04-22 DIAGNOSIS — M1711 Unilateral primary osteoarthritis, right knee: Secondary | ICD-10-CM | POA: Diagnosis not present

## 2023-04-29 ENCOUNTER — Other Ambulatory Visit (HOSPITAL_COMMUNITY): Payer: Self-pay

## 2023-05-01 ENCOUNTER — Other Ambulatory Visit (HOSPITAL_COMMUNITY): Payer: Self-pay

## 2023-05-01 NOTE — Patient Instructions (Signed)
 DUE TO COVID-19 ONLY TWO VISITORS  (aged 76 and older)  ARE ALLOWED TO COME WITH YOU AND STAY IN THE WAITING ROOM ONLY DURING PRE OP AND PROCEDURE.   **NO VISITORS ARE ALLOWED IN THE SHORT STAY AREA OR RECOVERY ROOM!!**  IF YOU WILL BE ADMITTED INTO THE HOSPITAL YOU ARE ALLOWED ONLY FOUR SUPPORT PEOPLE DURING VISITATION HOURS ONLY (7 AM -8PM)   The support person(s) must pass our screening, gel in and out, and wear a mask at all times, including in the patient's room. Patients must also wear a mask when staff or their support person are in the room. Visitors GUEST BADGE MUST BE WORN VISIBLY  One adult visitor may remain with you overnight and MUST be in the room by 8 P.M.     Your procedure is scheduled on: 05/10/23   Report to [facility] Main Entrance    Report to admitting at : 5:15 AM   Call this number if you have problems the morning of surgery [phone]   Eat a light diet the day before surgery.  Examples including soups, broths, toast, yogurt, mashed potatoes.  Things to avoid include carbonated beverages (fizzy beverages), raw fruits and raw vegetables, or beans.   If your bowels are filled with gas, your surgeon will have difficulty visualizing your pelvic organs which increases your surgical risks.  Do not eat food :After Midnight.   After Midnight you may have the following liquids until : 4:30 AM DAY OF SURGERY  Water  Black Coffee (sugar ok, NO MILK/CREAM OR CREAMERS)  Tea (sugar ok, NO MILK/CREAM OR CREAMERS) regular and decaf                             Plain Jell-O (NO RED)                                           Fruit ices (not with fruit pulp, NO RED)                                     Popsicles (NO RED)                                                                  Juice: apple, WHITE grape, WHITE cranberry Sports drinks like Gatorade (NO RED)   The day of surgery:  Drink ONE (1) Pre-Surgery Clear G2 at : 4:30 AM the morning of surgery. Drink in  one sitting. Do not sip.  This drink was given to you during your hospital  pre-op appointment visit. Nothing else to drink after completing the  Pre-Surgery Clear Ensure or G2.          If you have questions, please contact your surgeon's office.  FOLLOW ANY ADDITIONAL PRE OP INSTRUCTIONS YOU RECEIVED FROM YOUR SURGEON'S OFFICE!!!   Oral Hygiene is also important to reduce your risk of infection.  Remember - BRUSH YOUR TEETH THE MORNING OF SURGERY WITH YOUR REGULAR TOOTHPASTE  DENTURES WILL BE REMOVED PRIOR TO SURGERY PLEASE DO NOT APPLY Poly grip OR ADHESIVES!!!   Do NOT smoke after Midnight   Take these medicines the morning of surgery with A SIP OF WATER : veozah ,levothyroxine ,esomeprazole ,famotidine.Eye drops,nasal spray as usual.Tylenol  as needed.                              You may not have any metal on your body including hair pins, jewelry, and body piercing             Do not wear make-up, lotions, powders, perfumes/cologne, or deodorant  Do not wear nail polish including gel and S&S, artificial/acrylic nails, or any other type of covering on natural nails including finger and toenails. If you have artificial nails, gel coating, etc. that needs to be removed by a nail salon please have this removed prior to surgery or surgery may need to be canceled/ delayed if the surgeon/ anesthesia feels like they are unable to be safely monitored.   Do not shave  48 hours prior to surgery.    Do not bring valuables to the hospital. [place] IS NOT             RESPONSIBLE   FOR VALUABLES.   Contacts, glasses, or bridgework may not be worn into surgery.   Bring small overnight bag day of surgery.   DO NOT BRING YOUR HOME MEDICATIONS TO THE HOSPITAL. PHARMACY WILL DISPENSE MEDICATIONS LISTED ON YOUR MEDICATION LIST TO YOU DURING YOUR ADMISSION IN THE HOSPITAL!    Patients discharged on the day of surgery will not be allowed to drive home.  Someone  NEEDS to stay with you for the first 24 hours after anesthesia.   Special Instructions: Bring a copy of your healthcare power of attorney and living will documents         the day of surgery if you haven't scanned them before.              Please read over the following fact sheets you were given: IF YOU HAVE QUESTIONS ABOUT YOUR PRE-OP INSTRUCTIONS PLEASE CALL [phone]    [facility] - Preparing for Surgery Before surgery, you can play an important role.  Because skin is not sterile, your skin needs to be as free of germs as possible.  You can reduce the number of germs on your skin by washing with CHG (chlorahexidine gluconate) soap before surgery.  CHG is an antiseptic cleaner which kills germs and bonds with the skin to continue killing germs even after washing. Please DO NOT use if you have an allergy to CHG or antibacterial soaps.  If your skin becomes reddened/irritated stop using the CHG and inform your nurse when you arrive at Short Stay. Do not shave (including legs and underarms) for at least 48 hours prior to the first CHG shower.  You may shave your face/neck. Please follow these instructions carefully:  1.  Shower with CHG Soap the night before surgery and the  morning of Surgery.  2.  If you choose to wash your hair, wash your hair first as usual with your  normal  shampoo.  3.  After you shampoo, rinse your hair and body thoroughly to remove the  shampoo.  4.  Use CHG as you would any other liquid soap.  You can apply chg directly  to the skin and wash                       Gently with a scrungie or clean washcloth.  5.  Apply the CHG Soap to your body ONLY FROM THE NECK DOWN.   Do not use on face/ open                           Wound or open sores. Avoid contact with eyes, ears mouth and genitals (private parts).                       Wash face,  Genitals (private parts) with your normal soap.             6.  Wash thoroughly, paying special attention to  the area where your surgery  will be performed.  7.  Thoroughly rinse your body with warm water  from the neck down.  8.  DO NOT shower/wash with your normal soap after using and rinsing off  the CHG Soap.                9.  Pat yourself dry with a clean towel.            10.  Wear clean pajamas.            11.  Place clean sheets on your bed the night of your first shower and do not  sleep with pets. Day of Surgery : Do not apply any lotions/deodorants the morning of surgery.  Please wear clean clothes to the hospital/surgery center.  FAILURE TO FOLLOW THESE INSTRUCTIONS MAY RESULT IN THE CANCELLATION OF YOUR SURGERY PATIENT SIGNATURE_________________________________  NURSE SIGNATURE__________________________________  ________________________________________________________________________

## 2023-05-02 ENCOUNTER — Other Ambulatory Visit: Payer: Self-pay

## 2023-05-02 ENCOUNTER — Encounter (HOSPITAL_COMMUNITY)
Admission: RE | Admit: 2023-05-02 | Discharge: 2023-05-02 | Disposition: A | Discharge status: Home / Self Care | Payer: PPO | Source: Ambulatory Visit | Attending: General Surgery | Admitting: General Surgery

## 2023-05-02 ENCOUNTER — Encounter (HOSPITAL_COMMUNITY): Payer: Self-pay

## 2023-05-02 VITALS — HR 87 | Temp 98.4°F | Ht 62.0 in | Wt 175.0 lb

## 2023-05-02 DIAGNOSIS — Z01812 Encounter for preprocedural laboratory examination: Secondary | ICD-10-CM | POA: Diagnosis not present

## 2023-05-02 DIAGNOSIS — Z01818 Encounter for other preprocedural examination: Secondary | ICD-10-CM

## 2023-05-02 HISTORY — DX: Unspecified osteoarthritis, unspecified site: M19.90

## 2023-05-02 LAB — CBC
HCT: 42.7 % (ref 36.0–46.0)
Hemoglobin: 14.5 g/dL (ref 12.0–15.0)
MCH: 31.6 pg (ref 26.0–34.0)
MCHC: 34 g/dL (ref 30.0–36.0)
MCV: 93 fL (ref 80.0–100.0)
Platelets: 280 10*3/uL (ref 150–400)
RBC: 4.59 MIL/uL (ref 3.87–5.11)
RDW: 13.5 % (ref 11.5–15.5)
WBC: 8.5 10*3/uL (ref 4.0–10.5)
nRBC: 0 % (ref 0.0–0.2)

## 2023-05-02 LAB — BASIC METABOLIC PANEL
Anion gap: 9 (ref 5–15)
BUN: 16 mg/dL (ref 8–23)
CO2: 26 mmol/L (ref 22–32)
Calcium: 10.3 mg/dL (ref 8.9–10.3)
Chloride: 106 mmol/L (ref 98–111)
Creatinine, Ser: 1.1 mg/dL — ABNORMAL HIGH (ref 0.44–1.00)
GFR, Estimated: 52 mL/min — ABNORMAL LOW (ref >60)
Glucose, Bld: 97 mg/dL (ref 70–99)
Potassium: 4.3 mmol/L (ref 3.5–5.1)
Sodium: 141 mmol/L (ref 135–145)

## 2023-05-02 NOTE — Progress Notes (Addendum)
 For Anesthesia: PCP - Stephane Leita DEL, MD  Cardiologist - N/A  Bowel Prep reminder:N/A  Chest x-ray -  EKG -  Stress Test -  ECHO -  Cardiac Cath -  Pacemaker/ICD device last checked: Pacemaker orders received: Device Rep notified:  Spinal Cord Stimulator:N/A  Sleep Study - Yes CPAP - N/A  Fasting Blood Sugar - N/A Checks Blood Sugar _____ times a day Date and result of last Hgb A1c-  Last dose of GLP1 agonist- N/A GLP1 instructions:   Last dose of SGLT-2 inhibitors- N/A SGLT-2 instructions:   Blood Thinner Instructions:N/A Aspirin  Instructions: Last Dose:  Activity level: Can go up a flight of stairs and activities of daily living without stopping and without chest pain and/or shortness of breath   Able to exercise without chest pain and/or shortness of breath  Anesthesia review: Hx: CKD 3,Pre-DIA,OSA(NO CPAP)  Patient denies shortness of breath, fever, cough and chest pain at PAT appointment   Patient verbalized understanding of instructions that were given to them at the PAT appointment. Patient was also instructed that they will need to review over the PAT instructions again at home before surgery.

## 2023-05-08 ENCOUNTER — Other Ambulatory Visit (HOSPITAL_COMMUNITY): Payer: Self-pay

## 2023-05-10 ENCOUNTER — Encounter (HOSPITAL_COMMUNITY)
Admission: RE | Disposition: A | Discharge status: Home / Self Care | Payer: Self-pay | Source: Home / Self Care | Attending: General Surgery

## 2023-05-10 ENCOUNTER — Observation Stay (HOSPITAL_COMMUNITY)
Admission: RE | Admit: 2023-05-10 | Discharge: 2023-05-12 | Disposition: A | Discharge status: Home / Self Care | Payer: PPO | Attending: General Surgery | Admitting: General Surgery

## 2023-05-10 ENCOUNTER — Other Ambulatory Visit: Payer: Self-pay

## 2023-05-10 ENCOUNTER — Encounter (HOSPITAL_COMMUNITY): Payer: Self-pay | Admitting: General Surgery

## 2023-05-10 ENCOUNTER — Ambulatory Visit (HOSPITAL_BASED_OUTPATIENT_CLINIC_OR_DEPARTMENT_OTHER): Payer: PPO | Admitting: Anesthesiology

## 2023-05-10 ENCOUNTER — Ambulatory Visit (HOSPITAL_COMMUNITY): Payer: PPO | Admitting: Anesthesiology

## 2023-05-10 DIAGNOSIS — E039 Hypothyroidism, unspecified: Secondary | ICD-10-CM | POA: Diagnosis not present

## 2023-05-10 DIAGNOSIS — N189 Chronic kidney disease, unspecified: Secondary | ICD-10-CM | POA: Insufficient documentation

## 2023-05-10 DIAGNOSIS — K449 Diaphragmatic hernia without obstruction or gangrene: Secondary | ICD-10-CM

## 2023-05-10 DIAGNOSIS — K219 Gastro-esophageal reflux disease without esophagitis: Secondary | ICD-10-CM | POA: Diagnosis not present

## 2023-05-10 DIAGNOSIS — K440 Diaphragmatic hernia with obstruction, without gangrene: Secondary | ICD-10-CM | POA: Diagnosis not present

## 2023-05-10 DIAGNOSIS — Z79899 Other long term (current) drug therapy: Secondary | ICD-10-CM | POA: Diagnosis not present

## 2023-05-10 DIAGNOSIS — G4733 Obstructive sleep apnea (adult) (pediatric): Secondary | ICD-10-CM | POA: Diagnosis not present

## 2023-05-10 DIAGNOSIS — E785 Hyperlipidemia, unspecified: Secondary | ICD-10-CM | POA: Diagnosis not present

## 2023-05-10 DIAGNOSIS — N183 Chronic kidney disease, stage 3 unspecified: Secondary | ICD-10-CM | POA: Diagnosis not present

## 2023-05-10 HISTORY — PX: XI ROBOTIC ASSISTED HIATAL HERNIA REPAIR: SHX6889

## 2023-05-10 HISTORY — PX: LAPAROSCOPIC INSERTION GASTROSTOMY TUBE: SHX6817

## 2023-05-10 LAB — CBC
HCT: 40 % (ref 36.0–46.0)
Hemoglobin: 12.9 g/dL (ref 12.0–15.0)
MCH: 30.4 pg (ref 26.0–34.0)
MCHC: 32.3 g/dL (ref 30.0–36.0)
MCV: 94.1 fL (ref 80.0–100.0)
Platelets: 271 10*3/uL (ref 150–400)
RBC: 4.25 MIL/uL (ref 3.87–5.11)
RDW: 13.5 % (ref 11.5–15.5)
WBC: 13.8 10*3/uL — ABNORMAL HIGH (ref 4.0–10.5)
nRBC: 0 % (ref 0.0–0.2)

## 2023-05-10 LAB — CREATININE, SERUM
Creatinine, Ser: 1.13 mg/dL — ABNORMAL HIGH (ref 0.44–1.00)
GFR, Estimated: 51 mL/min — ABNORMAL LOW (ref >60)

## 2023-05-10 SURGERY — REPAIR, HERNIA, HIATAL, ROBOT-ASSISTED
Anesthesia: General

## 2023-05-10 MED ORDER — CHLORHEXIDINE GLUCONATE CLOTH 2 % EX PADS: 6.0000 | MEDICATED_PAD | Freq: Once | CUTANEOUS | Status: DC

## 2023-05-10 MED ORDER — PHENYLEPHRINE HCL-NACL 20-0.9 MG/250ML-% IV SOLN
INTRAVENOUS | Status: DC | PRN
  Administered 2023-05-10: 50 ug/min via INTRAVENOUS

## 2023-05-10 MED ORDER — ENSURE SURGERY PO LIQD: 237.0000 mL | Freq: Two times a day (BID) | ORAL | Status: DC

## 2023-05-10 MED ORDER — PROPOFOL 10 MG/ML IV BOLUS
INTRAVENOUS | Status: AC
  Filled 2023-05-10: qty 20

## 2023-05-10 MED ORDER — STERILE WATER FOR IRRIGATION IR SOLN
Status: DC | PRN
  Administered 2023-05-10: 1000 mL

## 2023-05-10 MED ORDER — PANTOPRAZOLE SODIUM 40 MG IV SOLR
40.0000 mg | Freq: Every day | INTRAVENOUS | Status: DC
  Administered 2023-05-10 – 2023-05-11 (×2): 40 mg via INTRAVENOUS
  Filled 2023-05-10 (×2): qty 10

## 2023-05-10 MED ORDER — AZELASTINE HCL 0.1 % NA SOLN: 1.0000 | Freq: Every day | NASAL | Status: DC | PRN

## 2023-05-10 MED ORDER — ACETAMINOPHEN 10 MG/ML IV SOLN
INTRAVENOUS | Status: AC
  Filled 2023-05-10: qty 100

## 2023-05-10 MED ORDER — LIDOCAINE HCL (PF) 2 % IJ SOLN
INTRAMUSCULAR | Status: AC
  Filled 2023-05-10: qty 5

## 2023-05-10 MED ORDER — TRAMADOL HCL 50 MG PO TABS
50.0000 mg | ORAL_TABLET | Freq: Four times a day (QID) | ORAL | Status: DC | PRN
  Administered 2023-05-12: 50 mg via ORAL
  Filled 2023-05-10: qty 1

## 2023-05-10 MED ORDER — FENTANYL CITRATE PF 50 MCG/ML IJ SOSY
PREFILLED_SYRINGE | INTRAMUSCULAR | Status: AC
  Filled 2023-05-10: qty 1

## 2023-05-10 MED ORDER — ONDANSETRON HCL 4 MG/2ML IJ SOLN
INTRAMUSCULAR | Status: AC
  Filled 2023-05-10: qty 2

## 2023-05-10 MED ORDER — OXYCODONE HCL 5 MG PO TABS: 5.0000 mg | ORAL_TABLET | Freq: Once | ORAL | Status: AC

## 2023-05-10 MED ORDER — FENTANYL CITRATE PF 50 MCG/ML IJ SOSY
25.0000 ug | PREFILLED_SYRINGE | INTRAMUSCULAR | Status: DC | PRN
  Administered 2023-05-10 (×2): 50 ug via INTRAVENOUS

## 2023-05-10 MED ORDER — ACETAMINOPHEN 500 MG PO TABS
1000.0000 mg | ORAL_TABLET | Freq: Four times a day (QID) | ORAL | Status: DC
  Administered 2023-05-10 – 2023-05-12 (×5): 1000 mg via ORAL
  Filled 2023-05-10 (×6): qty 2

## 2023-05-10 MED ORDER — LACTATED RINGERS IV SOLN: INTRAVENOUS | Status: AC

## 2023-05-10 MED ORDER — EPHEDRINE 5 MG/ML INJ
INTRAVENOUS | Status: AC
  Filled 2023-05-10: qty 5

## 2023-05-10 MED ORDER — SUGAMMADEX SODIUM 200 MG/2ML IV SOLN
INTRAVENOUS | Status: DC | PRN
  Administered 2023-05-10: 200 mg via INTRAVENOUS

## 2023-05-10 MED ORDER — BUPIVACAINE-EPINEPHRINE 0.25% -1:200000 IJ SOLN
INTRAMUSCULAR | Status: AC
  Filled 2023-05-10: qty 1

## 2023-05-10 MED ORDER — FENTANYL CITRATE (PF) 100 MCG/2ML IJ SOLN
INTRAMUSCULAR | Status: AC
  Filled 2023-05-10: qty 2

## 2023-05-10 MED ORDER — DIPHENHYDRAMINE HCL 50 MG/ML IJ SOLN: 12.5000 mg | Freq: Four times a day (QID) | INTRAMUSCULAR | Status: DC | PRN

## 2023-05-10 MED ORDER — EPHEDRINE SULFATE (PRESSORS) 50 MG/ML IJ SOLN
INTRAMUSCULAR | Status: DC | PRN
  Administered 2023-05-10: 10 mg via INTRAVENOUS

## 2023-05-10 MED ORDER — POLYVINYL ALCOHOL 1.4 % OP SOLN
Freq: Every day | OPHTHALMIC | Status: DC | PRN
Start: 2023-05-10 — End: 2023-05-12

## 2023-05-10 MED ORDER — SUCCINYLCHOLINE CHLORIDE 200 MG/10ML IV SOSY
PREFILLED_SYRINGE | INTRAVENOUS | Status: AC
  Filled 2023-05-10: qty 10

## 2023-05-10 MED ORDER — ONDANSETRON HCL 4 MG/2ML IJ SOLN: 4.0000 mg | Freq: Four times a day (QID) | INTRAMUSCULAR | Status: DC | PRN

## 2023-05-10 MED ORDER — ONDANSETRON 4 MG PO TBDP: 4.0000 mg | ORAL_TABLET | Freq: Four times a day (QID) | ORAL | Status: DC | PRN

## 2023-05-10 MED ORDER — METOPROLOL TARTRATE 5 MG/5ML IV SOLN: 5.0000 mg | Freq: Four times a day (QID) | INTRAVENOUS | Status: DC | PRN

## 2023-05-10 MED ORDER — BUPIVACAINE LIPOSOME 1.3 % IJ SUSP
INTRAMUSCULAR | Status: AC
  Filled 2023-05-10: qty 20

## 2023-05-10 MED ORDER — ONDANSETRON HCL 4 MG/2ML IJ SOLN
INTRAMUSCULAR | Status: DC | PRN
  Administered 2023-05-10: 4 mg via INTRAVENOUS

## 2023-05-10 MED ORDER — OXYCODONE HCL 5 MG PO TABS
ORAL_TABLET | ORAL | Status: AC
  Administered 2023-05-10: 5 mg via ORAL
  Filled 2023-05-10: qty 1

## 2023-05-10 MED ORDER — BUPIVACAINE-EPINEPHRINE 0.25% -1:200000 IJ SOLN
INTRAMUSCULAR | Status: DC | PRN
  Administered 2023-05-10: 50 mL

## 2023-05-10 MED ORDER — LIDOCAINE HCL (CARDIAC) PF 100 MG/5ML IV SOSY
PREFILLED_SYRINGE | INTRAVENOUS | Status: DC | PRN
  Administered 2023-05-10: 50 mg via INTRAVENOUS

## 2023-05-10 MED ORDER — 0.9 % SODIUM CHLORIDE (POUR BTL) OPTIME
TOPICAL | Status: DC | PRN
  Administered 2023-05-10: 1000 mL

## 2023-05-10 MED ORDER — DEXAMETHASONE SODIUM PHOSPHATE 10 MG/ML IJ SOLN
INTRAMUSCULAR | Status: AC
Start: 2023-05-10 — End: ?
  Filled 2023-05-10: qty 1

## 2023-05-10 MED ORDER — PROPOFOL 10 MG/ML IV BOLUS
INTRAVENOUS | Status: DC | PRN
  Administered 2023-05-10: 50 mg via INTRAVENOUS
  Administered 2023-05-10: 100 mg via INTRAVENOUS

## 2023-05-10 MED ORDER — FEZOLINETANT 45 MG PO TABS: 1.0000 | ORAL_TABLET | Freq: Every day | ORAL | Status: DC

## 2023-05-10 MED ORDER — ZOLPIDEM TARTRATE 5 MG PO TABS
5.0000 mg | ORAL_TABLET | Freq: Every evening | ORAL | Status: DC | PRN
  Administered 2023-05-10 – 2023-05-11 (×2): 5 mg via ORAL
  Filled 2023-05-10 (×2): qty 1

## 2023-05-10 MED ORDER — LEVOTHYROXINE SODIUM 25 MCG PO TABS
25.0000 ug | ORAL_TABLET | Freq: Every day | ORAL | Status: DC
  Administered 2023-05-11 – 2023-05-12 (×2): 25 ug via ORAL
  Filled 2023-05-10 (×2): qty 1

## 2023-05-10 MED ORDER — AMISULPRIDE (ANTIEMETIC) 5 MG/2ML IV SOLN: 10.0000 mg | Freq: Once | INTRAVENOUS | Status: DC | PRN

## 2023-05-10 MED ORDER — LACTATED RINGERS IV SOLN: INTRAVENOUS | Status: DC

## 2023-05-10 MED ORDER — ROCURONIUM BROMIDE 100 MG/10ML IV SOLN
INTRAVENOUS | Status: DC | PRN
  Administered 2023-05-10: 20 mg via INTRAVENOUS
  Administered 2023-05-10: 30 mg via INTRAVENOUS
  Administered 2023-05-10: 70 mg via INTRAVENOUS

## 2023-05-10 MED ORDER — CEFAZOLIN SODIUM-DEXTROSE 2-4 GM/100ML-% IV SOLN
2.0000 g | INTRAVENOUS | Status: AC
  Administered 2023-05-10: 2 g via INTRAVENOUS
  Filled 2023-05-10: qty 100

## 2023-05-10 MED ORDER — SUCCINYLCHOLINE CHLORIDE 200 MG/10ML IV SOSY
PREFILLED_SYRINGE | INTRAVENOUS | Status: DC | PRN
  Administered 2023-05-10: 80 mg via INTRAVENOUS

## 2023-05-10 MED ORDER — FENTANYL CITRATE (PF) 100 MCG/2ML IJ SOLN
INTRAMUSCULAR | Status: DC | PRN
  Administered 2023-05-10: 100 ug via INTRAVENOUS

## 2023-05-10 MED ORDER — ACETAMINOPHEN 500 MG PO TABS
1000.0000 mg | ORAL_TABLET | ORAL | Status: AC
  Administered 2023-05-10: 1000 mg via ORAL
  Filled 2023-05-10: qty 2

## 2023-05-10 MED ORDER — ENSURE PRE-SURGERY PO LIQD: 296.0000 mL | Freq: Once | ORAL | Status: DC

## 2023-05-10 MED ORDER — ACETAMINOPHEN 10 MG/ML IV SOLN
1000.0000 mg | Freq: Once | INTRAVENOUS | Status: AC
  Administered 2023-05-10: 1000 mg via INTRAVENOUS

## 2023-05-10 MED ORDER — ORAL CARE MOUTH RINSE: 15.0000 mL | Freq: Once | OROMUCOSAL | Status: AC

## 2023-05-10 MED ORDER — CHLORHEXIDINE GLUCONATE 0.12 % MT SOLN
15.0000 mL | Freq: Once | OROMUCOSAL | Status: AC
  Administered 2023-05-10: 15 mL via OROMUCOSAL

## 2023-05-10 MED ORDER — OXYCODONE HCL 5 MG PO TABS: 5.0000 mg | ORAL_TABLET | Freq: Four times a day (QID) | ORAL | 0 refills | Status: DC | PRN

## 2023-05-10 MED ORDER — DEXAMETHASONE SODIUM PHOSPHATE 4 MG/ML IJ SOLN
INTRAMUSCULAR | Status: DC | PRN
  Administered 2023-05-10: 8 mg via INTRAVENOUS

## 2023-05-10 MED ORDER — OXYCODONE HCL 5 MG PO TABS
5.0000 mg | ORAL_TABLET | ORAL | Status: DC | PRN
  Administered 2023-05-10 – 2023-05-12 (×5): 5 mg via ORAL
  Filled 2023-05-10 (×5): qty 1

## 2023-05-10 MED ORDER — ONDANSETRON HCL 4 MG/2ML IJ SOLN: 4.0000 mg | Freq: Once | INTRAMUSCULAR | Status: DC | PRN

## 2023-05-10 MED ORDER — DIPHENHYDRAMINE HCL 12.5 MG/5ML PO ELIX: 12.5000 mg | ORAL_SOLUTION | Freq: Four times a day (QID) | ORAL | Status: DC | PRN

## 2023-05-10 MED ORDER — MORPHINE SULFATE (PF) 2 MG/ML IV SOLN
2.0000 mg | INTRAVENOUS | Status: DC | PRN
  Administered 2023-05-10: 2 mg via INTRAVENOUS
  Filled 2023-05-10: qty 1

## 2023-05-10 MED ORDER — ENOXAPARIN SODIUM 40 MG/0.4ML IJ SOSY
40.0000 mg | PREFILLED_SYRINGE | INTRAMUSCULAR | Status: DC
  Administered 2023-05-11 – 2023-05-12 (×2): 40 mg via SUBCUTANEOUS
  Filled 2023-05-10 (×2): qty 0.4

## 2023-05-10 MED ORDER — PHENYLEPHRINE 80 MCG/ML (10ML) SYRINGE FOR IV PUSH (FOR BLOOD PRESSURE SUPPORT)
PREFILLED_SYRINGE | INTRAVENOUS | Status: AC
  Filled 2023-05-10: qty 10

## 2023-05-10 SURGICAL SUPPLY — 75 items
ANTIFOG SOL W/FOAM PAD STRL (MISCELLANEOUS) ×1
APPLIER CLIP 5 13 M/L LIGAMAX5 (MISCELLANEOUS)
APPLIER CLIP ROT 10 11.4 M/L (STAPLE)
BAG COUNTER SPONGE SURGICOUNT (BAG) ×1 IMPLANT
BENZOIN TINCTURE PRP APPL 2/3 (GAUZE/BANDAGES/DRESSINGS) IMPLANT
BLADE SURG SZ11 CARB STEEL (BLADE) ×1 IMPLANT
BNDG ADH 1X3 SHEER STRL LF (GAUZE/BANDAGES/DRESSINGS) ×2 IMPLANT
CABLE HIGH FREQUENCY MONO STRZ (ELECTRODE) IMPLANT
CHLORAPREP W/TINT 26 (MISCELLANEOUS) ×1 IMPLANT
CLIP APPLIE 5 13 M/L LIGAMAX5 (MISCELLANEOUS) IMPLANT
CLIP APPLIE ROT 10 11.4 M/L (STAPLE) IMPLANT
COVER SURGICAL LIGHT HANDLE (MISCELLANEOUS) ×1 IMPLANT
COVER TIP SHEARS 8 DVNC (MISCELLANEOUS) IMPLANT
DERMABOND ADVANCED .7 DNX12 (GAUZE/BANDAGES/DRESSINGS) ×1 IMPLANT
DRAIN PENROSE 0.5X18 (DRAIN) IMPLANT
DRAPE ARM DVNC X/XI (DISPOSABLE) ×4 IMPLANT
DRAPE COLUMN DVNC XI (DISPOSABLE) ×1 IMPLANT
DRAPE SURG ORHT 6 SPLT 77X108 (DRAPES) ×1 IMPLANT
DRIVER NDL LRG 8 DVNC XI (INSTRUMENTS) ×1 IMPLANT
DRIVER NDL MEGA SUTCUT DVNCXI (INSTRUMENTS) ×1 IMPLANT
DRIVER NDLE LRG 8 DVNC XI (INSTRUMENTS) ×1
DRIVER NDLE MEGA SUTCUT DVNCXI (INSTRUMENTS) ×1
ELECT REM PT RETURN 15FT ADLT (MISCELLANEOUS) ×1 IMPLANT
FORCEPS CADIERE DVNC XI (FORCEP) ×1 IMPLANT
G-TUBE MIC BOLUS 18FR ENFIT (TUBING) IMPLANT
G-TUBE MIC BOLUS 22FR ENFIT (TUBING) IMPLANT
G-TUBE MIC BOLUS 24FR ENFIT (CATHETERS) IMPLANT
GAUZE 4X4 16PLY ~~LOC~~+RFID DBL (SPONGE) ×1 IMPLANT
GLOVE BIOGEL PI IND STRL 7.0 (GLOVE) ×2 IMPLANT
GLOVE SURG SS PI 7.0 STRL IVOR (GLOVE) ×2 IMPLANT
GOWN STRL REUS W/ TWL LRG LVL3 (GOWN DISPOSABLE) ×2 IMPLANT
GOWN STRL REUS W/ TWL XL LVL3 (GOWN DISPOSABLE) IMPLANT
GRASPER SUT TROCAR 14GX15 (MISCELLANEOUS) IMPLANT
GRASPER TIP-UP FEN DVNC XI (INSTRUMENTS) ×1 IMPLANT
IRRIG SUCT STRYKERFLOW 2 WTIP (MISCELLANEOUS) ×1
IRRIGATION SUCT STRKRFLW 2 WTP (MISCELLANEOUS) ×1 IMPLANT
KIT BASIN OR (CUSTOM PROCEDURE TRAY) ×1 IMPLANT
KIT TURNOVER KIT A (KITS) IMPLANT
LUBRICANT JELLY K Y 4OZ (MISCELLANEOUS) IMPLANT
MARKER SKIN DUAL TIP RULER LAB (MISCELLANEOUS) ×1 IMPLANT
MESH BIO-A 7X10 SYN MAT (Mesh General) IMPLANT
NDL HYPO 22X1.5 SAFETY MO (MISCELLANEOUS) ×1 IMPLANT
NEEDLE HYPO 22X1.5 SAFETY MO (MISCELLANEOUS) ×1
PACK CARDIOVASCULAR III (CUSTOM PROCEDURE TRAY) ×1 IMPLANT
PENCIL SMOKE EVACUATOR (MISCELLANEOUS) IMPLANT
SCISSORS LAP 5X35 DISP (ENDOMECHANICALS) IMPLANT
SCISSORS MNPLR CVD DVNC XI (INSTRUMENTS) ×1 IMPLANT
SEAL UNIV 5-12 XI (MISCELLANEOUS) ×3 IMPLANT
SEALER VESSEL EXT DVNC XI (MISCELLANEOUS) ×1 IMPLANT
SET IRRIG Y TYPE TUR BLADDER L (SET/KITS/TRAYS/PACK) IMPLANT
SET TUBE SMOKE EVAC HIGH FLOW (TUBING) ×1 IMPLANT
SLEEVE Z-THREAD 5X100MM (TROCAR) ×1 IMPLANT
SOL ELECTROSURG ANTI STICK (MISCELLANEOUS)
SOLUTION ANTFG W/FOAM PAD STRL (MISCELLANEOUS) ×1 IMPLANT
SOLUTION ELECTROSURG ANTI STCK (MISCELLANEOUS) IMPLANT
SPIKE FLUID TRANSFER (MISCELLANEOUS) ×1 IMPLANT
SPONGE DRAIN TRACH 4X4 STRL 2S (GAUZE/BANDAGES/DRESSINGS) IMPLANT
STRIP CLOSURE SKIN 1/2X4 (GAUZE/BANDAGES/DRESSINGS) IMPLANT
SUT ETHIBOND 0 36 GRN (SUTURE) ×2 IMPLANT
SUT ETHILON 2 0 PS N (SUTURE) IMPLANT
SUT MNCRL AB 4-0 PS2 18 (SUTURE) ×1 IMPLANT
SUT SILK 0 SH 30 (SUTURE) IMPLANT
SUT SILK 2 0 SH (SUTURE) ×3 IMPLANT
SYR 20ML LL LF (SYRINGE) ×2 IMPLANT
TAPE STRIPS DRAPE STRL (GAUZE/BANDAGES/DRESSINGS) IMPLANT
TIP INNERVISION DETACH 40FR (MISCELLANEOUS) IMPLANT
TIP INNERVISION DETACH 50FR (MISCELLANEOUS) IMPLANT
TIP INNERVISION DETACH 56FR (MISCELLANEOUS) IMPLANT
TOWEL OR 17X26 10 PK STRL BLUE (TOWEL DISPOSABLE) ×1 IMPLANT
TRAY FOLEY MTR SLVR 16FR STAT (SET/KITS/TRAYS/PACK) IMPLANT
TRAY LAPAROSCOPIC (CUSTOM PROCEDURE TRAY) ×1 IMPLANT
TROCAR Z-THREAD OPTICAL 5X100M (TROCAR) ×1 IMPLANT
TUBE GASTRO BOLUS 18FR ENFIT (TUBING) ×1
TUBE GASTRO BOLUS 22FR ENFIT (TUBING)
TUBE GASTRO BOLUS 24FR ENFIT (CATHETERS)

## 2023-05-10 NOTE — Op Note (Signed)
Preoperative diagnosis: recurrent hiatal hernia  Postoperative diagnosis: same   Procedure: robotic paraesophageal hernia repair, placement of mesh, laparoscopic gastrostomy tube insertion  Surgeon: Feliciana Rossetti, M.D.  Asst: Estelle Grumbles, M.D.  Anesthesia: general  Indications for procedure: Megan Booth is a 78 y.o. year old female with symptoms of reflux and worsening dysphagia. She had a type III hiatal hernia seen on UGI.  Description of procedure: The patient was brought into the operative suite. Anesthesia was administered with General endotracheal anesthesia. WHO checklist was applied. The patient was then placed in supine position. The area was prepped and draped in the usual sterile fashion.  Next, a left mid abdominal incision was made. A 5mm trocar was used to gain access to the peritoneal cavity by optical entry technique. Pneumoperitoneum was applied with a high flow and low pressure. The laparoscope was reinserted to confirm position. An 8 mm trocar was placed in the left mid abdomen and an 8 mm trocar was placed in the left lateral abdomen. An 8 mm trocar was placed in the right mid abdomen. A 5 mm trocar was placed in the left lateral abdomen. Bilateral TAP blocks were placed with Marcaine. A Nathanson retractor was placed in the subxiphoid space and used to retract the left lobe of the liver.  The hiatal hernia appeared large and contained 1/3 of the her stomach. The pars flaccida was divided with harmonic scalpel The peritoneum of the right crus was divided and the sac separated from the chest contents. This plane was continued anteriorly and to the left crus. Next, the posterior area was dissected free. Additional care was used to dissect attachments to the chest to the sac and esophagus to improve mobility. A penrose was placed around the GE junction for visualization and retraction. The esophagus was completely freed from surrounding attachments. Care was taken to avoid  injury to the vagus nerves. The sac was adhered to the left pleua and a portion of pleura was taken with the sac. The sac was divided from the GE junction and removed.  The crus was repaired with 4 interrupted 2-0 ethibond sutures showing appropriate sizing of the crus. A Gore absorbable mesh was placed to the diaphram and sutured in place in 5 locations with interrupted 2-0 Ethibond.   Next, a portion of the distal gastric body was identified for g tube. 2-0 silk was used to create a pursestring. 1 2-0 silk was used to suture stomach proximal to the pursestring to the abdominal wall with suture passer. 1 2-0 silk was used to suture the stomach distal to the pursestring to the abdominal wall. Next, a small incision at the abdominal wall was made between the 2 silks and 18Fr g tube was inserted. Gastrotomy was performed and the g tube inserted and 10 ml of water placed into the balloon. The pursestring was tied around the g tube. The silks were tied down so the stomach apposed the abdominal wall. The g tube was seen with skin at 5 cm.  Hemostasis was inspected and intact. Pneumoperitoneum was removed. All trocars were removed. All incisions were closed with 4-0 monocryl subcuticular suture. Steristrips and bandaids were placed for dressing. The patient awoke from anesthesia and was brought to pacu in stable condition.   Findings: type III recurrent hiatal hernia  Specimen: none  Implant: 18 Fr g tube   Blood loss: 50 ml  Local anesthesia: 50 ml Marcaine  Complications: none  Feliciana Rossetti, M.D. General, Bariatric, & Minimally Invasive Surgery  [facility], [place]

## 2023-05-10 NOTE — Anesthesia Procedure Notes (Signed)
Procedure Name: Intubation Date/Time: 05/10/2023 7:37 AM  Performed by: Carloyn Manner, CRNAPre-anesthesia Checklist: Patient identified, Emergency Drugs available, Suction available, Patient being monitored and Timeout performed Patient Re-evaluated:Patient Re-evaluated prior to induction Oxygen Delivery Method: Circle system utilized Preoxygenation: Pre-oxygenation with 100% oxygen Induction Type: IV induction, Rapid sequence and Cricoid Pressure applied Laryngoscope Size: Miller and 2 Grade View: Grade I Tube type: Oral Tube size: 7.0 mm Number of attempts: 1 Airway Equipment and Method: Stylet Placement Confirmation: ETT inserted through vocal cords under direct vision, positive ETCO2 and breath sounds checked- equal and bilateral Secured at: 21 cm Tube secured with: Tape Dental Injury: Teeth and Oropharynx as per pre-operative assessment  Comments: RSI with CP, Cords Clear

## 2023-05-10 NOTE — Anesthesia Postprocedure Evaluation (Signed)
Anesthesia Post Note  Patient: Megan Booth  Procedure(s) Performed: ROBTIC RECURRENT HIATAL HERNIA REPAIR LAPAROSCOPIC INSERTION GASTROSTOMY TUBE     Patient location during evaluation: PACU Anesthesia Type: General Level of consciousness: awake Pain management: pain level controlled Vital Signs Assessment: post-procedure vital signs reviewed and stable Respiratory status: spontaneous breathing, nonlabored ventilation and respiratory function stable Cardiovascular status: blood pressure returned to baseline and stable Postop Assessment: no apparent nausea or vomiting Anesthetic complications: no   No notable events documented.  Last Vitals:  Vitals:   05/10/23 1430 05/10/23 1446  BP: 130/73 135/67  Pulse: 66 66  Resp: 17 14  Temp:  (!) 36.4 C  SpO2: 94% 98%    Last Pain:  Vitals:   05/10/23 1200  TempSrc:   PainSc: Asleep                 Aurelio Mccamy P Landon Bassford

## 2023-05-10 NOTE — H&P (Signed)
Chief Complaint: New Consultation ( hiatal hernia)   History of Present Illness: Megan Booth is a 78 y.o. female who is seen today for recurrent hiatal hernia.  She had a laparoscopic repair done in 2021. She had symptom improvement. She had a cardiac CT which showed radiological recurrence 2 year ago. Last year she had an upper endoscopy which showed no recurrence as well as an upper GI. She did not have any new symptoms until about 4 months ago when she started having regurgitation at night as well as heartburn throughout the day as well as more burping and some trouble with swallowing. She is taking Nexium without relief.  Review of Systems: A complete review of systems was obtained from the patient. I have reviewed this information and discussed as appropriate with the patient. See HPI as well for other ROS.  Review of Systems  Constitutional: Negative.  HENT: Negative.  Eyes: Negative.  Respiratory: Negative.  Cardiovascular: Negative.  Gastrointestinal: Positive for heartburn and nausea.  Genitourinary: Negative.  Musculoskeletal: Negative.  Skin: Negative.  Neurological: Negative.  Endo/Heme/Allergies: Negative.  Psychiatric/Behavioral: Negative.    Medical History: Past Medical History:  Diagnosis Date  Anxiety  Chronic kidney disease  GERD (gastroesophageal reflux disease)  Sleep apnea   There is no problem list on file for this patient.  Past Surgical History:  Procedure Laterality Date  LAPAROSCOPIC PARAESOPHAGEAL HERNIA REPAIR WITH FUNDOPLICATION 08/18/2019  Dr. Sheliah Hatch  DILATATION & CURETTAGE/HYSTEROSCOPY WITH MYOSURE 06/16/2021  Dr. Orville Govern    No Known Allergies  Current Outpatient Medications on File Prior to Visit  Medication Sig Dispense Refill  esomeprazole (NEXIUM) 40 MG DR capsule TAKE ONE TABLET BY MOUTH ONCE DAILY BEFORE A MEAL  famotidine, bulk, 100 % Powd Take by mouth  FUROsemide (LASIX) 20 MG tablet Take by mouth  levothyroxine  (SYNTHROID) 25 MCG tablet Take 25 mcg by mouth every morning before breakfast (0630)  PARoxetine (PAXIL) 10 MG tablet Take 1 tablet by mouth once daily  VEOZAH 45 mg Tab Take 1 tablet by mouth once daily  zolpidem (AMBIEN) 10 mg tablet TAKE 1/2 TABLET BY MOUTH AT BEDTIME AS NEEDED FOR SLEEP   No current facility-administered medications on file prior to visit.   Family History  Problem Relation Age of Onset  Diabetes Mother  Depression Mother  High blood pressure (Hypertension) Mother  Coronary Artery Disease (Blocked arteries around heart) Mother  Dementia Mother  Seizures Brother    Social History   Tobacco Use  Smoking Status Never  Smokeless Tobacco Never    Social History   Socioeconomic History  Marital status: Married  Tobacco Use  Smoking status: Never  Smokeless tobacco: Never  Vaping Use  Vaping status: Never Used  Substance and Sexual Activity  Alcohol use: Not Currently  Drug use: Never   Objective:   Vitals:  02/14/23 1015  BP: 135/85  Pulse: 88  Temp: 36.2 C (97.2 F)  SpO2: 97%  Weight: 81.6 kg (179 lb 12.8 oz)  Height: 157.5 cm (5\' 2" )  PainSc: 0-No pain   Body mass index is 32.89 kg/m.  Physical Exam Constitutional:  Appearance: Normal appearance.  HENT:  Head: Normocephalic and atraumatic.  Pulmonary:  Effort: Pulmonary effort is normal.  Musculoskeletal:  General: Normal range of motion.  Cervical back: Normal range of motion.  Neurological:  General: No focal deficit present.  Mental Status: She is alert and oriented to person, place, and time. Mental status is at baseline.  Psychiatric:  Mood  and Affect: Mood normal.  Behavior: Behavior normal.  Thought Content: Thought content normal.     Labs, Imaging and Diagnostic Testing:  I reviewed endoscopy by Wendall Papa showing recurrent hiatal hernia I reviewed upper GI images showing recurrent hiatal hernia without obstruction I reviewed notes by Tiajuana Amass  Assessment and Plan:   Diagnoses and all orders for this visit:  Hiatal hernia with gastroesophageal reflux    We discussed the etiology and symptoms of hiatal hernias. We discussed possible future issues of worsened reflux, early satiety, Cameron's ulcer, and volvulus. We went over surgical options of laparoscopic reduction of the hiatal hernia with identification of the esophagus and stomach, movable of the sac, ensuring appropriate esophageal length into the abdomen, repairing the diaphragm crura, possible placement of mesh, partial fundoplication and other fundoplication options, and possible need for gastrostomy tube. We discussed risks of bleeding, infection, pneumonia, injury to esophagus, injury to stomach, injury to other abdominal or thoracic organs, trouble swallowing, gas bloat, and need for additional surgery. All questions were answered. Patient decided proceed with robotic recurrent hiatal hernia repair with mesh and g tube with hospital stay.

## 2023-05-10 NOTE — Transfer of Care (Signed)
Immediate Anesthesia Transfer of Care Note  Patient: Megan Booth  Procedure(s) Performed: ROBTIC RECURRENT HIATAL HERNIA REPAIR LAPAROSCOPIC INSERTION GASTROSTOMY TUBE  Patient Location: PACU  Anesthesia Type:General  Level of Consciousness: awake, alert , oriented, and patient cooperative  Airway & Oxygen Therapy: Patient Spontanous Breathing and Patient connected to face mask oxygen  Post-op Assessment: Report given to RN and Post -op Vital signs reviewed and stable  Post vital signs: Reviewed and stable  Last Vitals:  Vitals Value Taken Time  BP 113/73 05/10/23 1103  Temp    Pulse 76 05/10/23 1106  Resp 16 05/10/23 1106  SpO2 94 % 05/10/23 1106  Vitals shown include unfiled device data.  Last Pain:  Vitals:   05/10/23 0543  TempSrc:   PainSc: 0-No pain         Complications: No notable events documented.

## 2023-05-10 NOTE — Discharge Instructions (Signed)

## 2023-05-10 NOTE — Anesthesia Preprocedure Evaluation (Addendum)
Anesthesia Evaluation  Patient identified by MRN, date of birth, ID band Patient awake    Reviewed: Allergy & Precautions, NPO status , Patient's Chart, lab work & pertinent test results  Airway Mallampati: II  TM Distance: >3 FB Neck ROM: Full    Dental no notable dental hx.    Pulmonary sleep apnea    Pulmonary exam normal        Cardiovascular negative cardio ROS Normal cardiovascular exam     Neuro/Psych   Anxiety     negative neurological ROS     GI/Hepatic Neg liver ROS, hiatal hernia,GERD  Medicated,,  Endo/Other  Hypothyroidism    Renal/GU Renal disease     Musculoskeletal  (+) Arthritis ,    Abdominal  (+) + obese  Peds  Hematology negative hematology ROS (+)   Anesthesia Other Findings HIATAL HERNIA  Reproductive/Obstetrics                             Anesthesia Physical Anesthesia Plan  ASA: 3  Anesthesia Plan: General   Post-op Pain Management:    Induction: Intravenous  PONV Risk Score and Plan: 3 and Ondansetron, Dexamethasone, Propofol infusion and Treatment may vary due to age or medical condition  Airway Management Planned: Oral ETT  Additional Equipment:   Intra-op Plan:   Post-operative Plan: Extubation in OR  Informed Consent: I have reviewed the patients History and Physical, chart, labs and discussed the procedure including the risks, benefits and alternatives for the proposed anesthesia with the patient or authorized representative who has indicated his/her understanding and acceptance.     Dental advisory given  Plan Discussed with: CRNA  Anesthesia Plan Comments:        Anesthesia Quick Evaluation

## 2023-05-11 ENCOUNTER — Observation Stay (HOSPITAL_COMMUNITY): Payer: PPO

## 2023-05-11 DIAGNOSIS — K440 Diaphragmatic hernia with obstruction, without gangrene: Secondary | ICD-10-CM | POA: Diagnosis not present

## 2023-05-11 DIAGNOSIS — Z931 Gastrostomy status: Secondary | ICD-10-CM | POA: Diagnosis not present

## 2023-05-11 DIAGNOSIS — K449 Diaphragmatic hernia without obstruction or gangrene: Secondary | ICD-10-CM | POA: Diagnosis not present

## 2023-05-11 LAB — BASIC METABOLIC PANEL
Anion gap: 5 (ref 5–15)
BUN: 18 mg/dL (ref 8–23)
CO2: 28 mmol/L (ref 22–32)
Calcium: 9.4 mg/dL (ref 8.9–10.3)
Chloride: 106 mmol/L (ref 98–111)
Creatinine, Ser: 0.97 mg/dL (ref 0.44–1.00)
GFR, Estimated: >60 mL/min (ref >60)
Glucose, Bld: 123 mg/dL — ABNORMAL HIGH (ref 70–99)
Potassium: 4.6 mmol/L (ref 3.5–5.1)
Sodium: 139 mmol/L (ref 135–145)

## 2023-05-11 LAB — CBC
HCT: 37.5 % (ref 36.0–46.0)
Hemoglobin: 11.9 g/dL — ABNORMAL LOW (ref 12.0–15.0)
MCH: 30.2 pg (ref 26.0–34.0)
MCHC: 31.7 g/dL (ref 30.0–36.0)
MCV: 95.2 fL (ref 80.0–100.0)
Platelets: 257 10*3/uL (ref 150–400)
RBC: 3.94 MIL/uL (ref 3.87–5.11)
RDW: 13.6 % (ref 11.5–15.5)
WBC: 11.6 10*3/uL — ABNORMAL HIGH (ref 4.0–10.5)
nRBC: 0 % (ref 0.0–0.2)

## 2023-05-11 MED ORDER — IOHEXOL 300 MG/ML  SOLN
75.0000 mL | Freq: Once | INTRAMUSCULAR | Status: AC | PRN
  Administered 2023-05-11: 75 mL via ORAL

## 2023-05-11 NOTE — Progress Notes (Signed)
Mobility Specialist - Progress Note   05/11/23 1115  Mobility  Activity Ambulated with assistance in hallway  Level of Assistance Modified independent, requires aide device or extra time  Assistive Device Front wheel walker  Distance Ambulated (ft) 200 ft  Activity Response Tolerated well  Mobility Referral Yes  Mobility visit 1 Mobility  Mobility Specialist Start Time (ACUTE ONLY) 1104  Mobility Specialist Stop Time (ACUTE ONLY) 1115  Mobility Specialist Time Calculation (min) (ACUTE ONLY) 11 min   Pt received in bed and agreeable to mobility. No complaints during session. Pt to bathroom after session with all needs met.    [facility]

## 2023-05-11 NOTE — Progress Notes (Signed)
    1 Day Post-Op  Subjective: Endorsing some chest pain but denies abdominal pain or other complaints.  Sating well on RA  ROS: See above, otherwise other systems negative  Objective: Vital signs in last 24 hours: Temp:  [97.4 F (36.3 C)-98.3 F (36.8 C)] 98 F (36.7 C) (01/18 0620) Pulse Rate:  [63-79] 68 (01/18 0620) Resp:  [14-24] 18 (01/18 0620) BP: (108-135)/(65-91) 111/65 (01/18 0620) SpO2:  [91 %-100 %] 91 % (01/18 0620) Last BM Date : 05/09/23  Intake/Output from previous day: 01/17 0701 - 01/18 0700 In: 1926.5 [P.O.:240; I.V.:1586.5; IV Piggyback:100] Out: 200 [Urine:150; Blood:50] Intake/Output this shift: No intake/output data recorded.  PE: Gen: female resting, NAD Resp: equal chest rise, no increased WOB CV: RRR Abd: soft, non-distended port sites clean and dry, G-tube capped without drainage.   Lab Results:  Recent Labs    05/10/23 1509 05/11/23 0539  WBC 13.8* 11.6*  HGB 12.9 11.9*  HCT 40.0 37.5  PLT 271 257   BMET Recent Labs    05/10/23 1509 05/11/23 0539  NA  --  139  K  --  4.6  CL  --  106  CO2  --  28  GLUCOSE  --  123*  BUN  --  18  CREATININE 1.13* 0.97  CALCIUM  --  9.4   PT/INR No results for input(s): "LABPROT", "INR" in the last 72 hours. CMP     Component Value Date/Time   NA 139 05/11/2023 0539   K 4.6 05/11/2023 0539   CL 106 05/11/2023 0539   CO2 28 05/11/2023 0539   GLUCOSE 123 (H) 05/11/2023 0539   BUN 18 05/11/2023 0539   CREATININE 0.97 05/11/2023 0539   CALCIUM 9.4 05/11/2023 0539   PROT 6.4 01/05/2022 1107   ALBUMIN 3.9 01/05/2022 1107   AST 18 01/05/2022 1107   ALT 20 01/05/2022 1107   ALKPHOS 93 01/05/2022 1107   BILITOT 0.4 01/05/2022 1107   GFRNONAA >60 05/11/2023 0539   GFRAA >60 08/19/2019 0417   Lipase  No results found for: "LIPASE"  Studies/Results: No results found.  Anti-infectives: Anti-infectives (From admission, onward)    Start     Dose/Rate Route Frequency Ordered Stop    05/10/23 0600  ceFAZolin (ANCEF) IVPB 2g/100 mL premix        2 g 200 mL/hr over 30 Minutes Intravenous On call to O.R. 05/10/23 0540 05/10/23 0740       Assessment/Plan  78 y/o F POD 1 from lap redo HH repair with G-tube placement  - UGI ordered. Will follow up the result  - Continue FLD  Hypothyroidism - home synthroid GERD - protonix   Dispo - possibly home this weekend pending UGI and clinical course   LOS: 0 days   T[facility] 05/11/2023, 8:28 AM Please see Amion for pager number during day hours 7:00am-4:30pm or 7:00am -11:30am on weekends

## 2023-05-12 DIAGNOSIS — K440 Diaphragmatic hernia with obstruction, without gangrene: Secondary | ICD-10-CM | POA: Diagnosis not present

## 2023-05-12 NOTE — TOC Initial Note (Signed)
Transition of Care [facility]) - Initial/Assessment Note    Patient Details  Name: Megan Booth MRN: [id number] Date of Birth: Aug 07, 1945  [facility] CM/SW Contact:    Adrian Prows, RN Phone Number: 05/12/2023, 9:18 AM  Clinical Narrative:                 Sherron Monday w/ pt in room; pt says she lives at home; she plans to return at d/c' pt identified POC Delylah Buice [phone]); her husband will provide transport; pt verified PCP/insurance; she denies SDOH risks; pt does not have DME, HH services, or home oxygen; also this RN, CM explained MOON to pt; she declined signing document, and says she has "more questions for Medicare"; copy of document given to pt; no TOC needs.  Expected Discharge Plan: Home/Self Care Barriers to Discharge: No Barriers Identified   Patient Goals and CMS Choice Patient states their goals for this hospitalization and ongoing recovery are:: home CMS Medicare.gov Compare Post Acute Care list provided to:: Patient        Expected Discharge Plan and Services   Discharge Planning Services: CM Consult Post Acute Care Choice: NA Living arrangements for the past 2 months: Single Family Home Expected Discharge Date: 05/12/23               DME Arranged: N/A DME Agency: NA       HH Arranged: NA HH Agency: NA        Prior Living Arrangements/Services Living arrangements for the past 2 months: Single Family Home Lives with:: Spouse Patient language and need for interpreter reviewed:: Yes Do you feel safe going back to the place where you live?: Yes      Need for Family Participation in Patient Care: Yes (Comment) Care giver support system in place?: Yes (comment) Current home services:  (n/a) Criminal Activity/Legal Involvement Pertinent to Current Situation/Hospitalization: No - Comment as needed  Activities of Daily Living   ADL Screening (condition at time of admission) Independently performs ADLs?: Yes (appropriate for  developmental age) Is the patient deaf or have difficulty hearing?: No Does the patient have difficulty seeing, even when wearing glasses/contacts?: No Does the patient have difficulty concentrating, remembering, or making decisions?: No  Permission Sought/Granted Permission sought to share information with : Case Manager Permission granted to share information with : Yes, Verbal Permission Granted  Share Information with NAME: Case Manager     Permission granted to share info w Relationship: Demetric Quick (spouse) [phone]     Emotional Assessment Appearance:: Appears stated age Attitude/Demeanor/Rapport: Gracious Affect (typically observed): Accepting Orientation: : Oriented to Self, Oriented to Place, Oriented to  Time, Oriented to Situation Alcohol / Substance Use: Not Applicable Psych Involvement: No (comment)  Admission diagnosis:  Hiatal hernia [K44.9] Patient Active Problem List   Diagnosis Date Noted   Hiatal hernia 05/10/2023   Tendonitis 07/15/2019   CKD (chronic kidney disease), stage III (HCC) 08/25/2018   Osteopenia 04/15/2015   Hormone replacement therapy 08/12/2014   Edema 08/12/2014   Insomnia 08/12/2014   Amnesia, global, transient    Hyperlipidemia    GERD (gastroesophageal reflux disease)    Hypothyroidism    Sleep apnea    Hx of colonic polyps    Anxiety    Leukoplakia, vulva    B12 deficiency 08/03/2011   PCP:  Melida Quitter, MD Pharmacy:   [facility] [place], [place] - [address] [place]  [place] [address] Phone: [phone] Fax: [phone]  [facility] - [place], [place] - [address] [place] [address] Phone: [phone] Fax: [phone]     Social Drivers of Health (SDOH) Social History: SDOH Screenings   Food Insecurity: No Food Insecurity (05/12/2023)  Housing: Low Risk  (05/12/2023)  Transportation Needs: No Transportation Needs  (05/12/2023)  Utilities: Not At Risk (05/12/2023)  Depression (PHQ2-9): Low Risk  (10/27/2020)  Social Connections: Unknown (05/10/2023)  Tobacco Use: Low Risk  (05/10/2023)   SDOH Interventions: Food Insecurity Interventions: Intervention Not Indicated, Inpatient TOC Housing Interventions: Intervention Not Indicated, Inpatient TOC Transportation Interventions: Intervention Not Indicated, Inpatient TOC Utilities Interventions: Intervention Not Indicated, Inpatient TOC   Readmission Risk Interventions     No data to display

## 2023-05-12 NOTE — Care Management Obs Status (Signed)
MEDICARE OBSERVATION STATUS NOTIFICATION   Patient Details  Name: Megan Booth MRN: [id number] Date of Birth: 09/23/1945   Medicare Observation Status Notification Given:  Yes    Adrian Prows, RN 05/12/2023, 9:13 AM

## 2023-05-12 NOTE — Discharge Summary (Signed)
Physician Discharge Summary  Patient ID: Megan Booth MRN: [id number] DOB/AGE: 11-14-1945 78 y.o.  Admit date: 05/10/2023 Discharge date: 05/12/2023  Admission Diagnoses: Status post hiatal hernia  Discharge Diagnoses:  Active Problems:   Hiatal hernia Status post hiatal hernia  Discharged Condition: good  Hospital Course: Patient underwent robotic hiatal hernia repair with mesh and Laparoscopic G-tube placement. Patient has been doing well postoperatively.  She went upper GI which revealed no signs of leak.  She was on a full liquid diet.  She was tolerating this well.  She was otherwise ambulating well on her own.  She had some liquid bowel movements prior to discharge.  She was otherwise deemed ER for discharge and discharged home.  Consults: None  Significant Diagnostic Studies: Upper GI without leak to the esophagus  Treatments: surgery: As above  Discharge Exam: Blood pressure (!) 152/75, pulse 77, temperature 98 F (36.7 C), temperature source Oral, resp. rate 18, height 5\' 2"  (1.575 m), weight 79.4 kg, SpO2 93%. GI: soft, non-tender; bowel sounds normal; no masses,  no organomegaly and incision clean dry and intact, G-tube in place  Disposition: Discharge disposition: 01-Home or Self Care       Discharge Instructions     Diet full liquid   Complete by: As directed    Increase activity slowly   Complete by: As directed       Allergies as of 05/12/2023   No Known Allergies      Medication List     TAKE these medications    acetaminophen 500 MG tablet Commonly known as: TYLENOL Take 500 mg by mouth 2 (two) times daily.   atorvastatin 40 MG tablet Commonly known as: LIPITOR TAKE 1 TABLET ONCE DAILY AT 6 PM.   Azelastine HCl 137 MCG/SPRAY Soln Place 1 spray into the nose daily as needed (congestion).   B-12 2500 MCG Tabs Take 2,500 mcg by mouth daily.   clobetasol ointment 0.05 % Commonly known as: TEMOVATE Apply 1 Application topically  daily as needed (irritaiton).   esomeprazole 40 MG capsule Commonly known as: NEXIUM TAKE ONE TABLET BY MOUTH ONCE DAILY BEFORE A MEAL   famotidine 20 MG tablet Commonly known as: PEPCID Take 20 mg by mouth daily.   furosemide 20 MG tablet Commonly known as: LASIX Take 1 tablet (20 mg total) by mouth daily as needed for fluid.   levothyroxine 25 MCG tablet Commonly known as: SYNTHROID Take 1 tablet (25 mcg total) by mouth daily.   LUBRICATING EYE DROPS OP Place 1 drop into both eyes daily as needed (dry eyes).   oxyCODONE 5 MG immediate release tablet Commonly known as: Oxy IR/ROXICODONE Take 1 tablet (5 mg total) by mouth every 6 (six) hours as needed for up to 30 doses for severe pain (pain score 7-10). 1-2 Tabs PO q6h PRN pain   Veozah 45 MG Tabs Generic drug: Fezolinetant Take 1 tablet by mouth daily.   Vitamin D 50 MCG (2000 UT) tablet Take 2,000 Units by mouth daily.   Zepbound 2.5 MG/0.5ML Pen Generic drug: tirzepatide Inject 2.5 mg into the skin once a week.   Zepbound 5 MG/0.5ML Pen Generic drug: tirzepatide Inject 5 mg into the skin once a week.   zolpidem 10 MG tablet Commonly known as: AMBIEN TAKE 1/2 TO 1 TABLET AT BEDTIME AS NEEDED FOR SLEEP.        Follow-up Information     Kinsinger, De Blanch, MD Follow up on 06/06/2023.   Specialty: General Surgery  Contact information: [address] [place] [address] [phone]                 Signed: Axel Filler 05/12/2023, 8:39 AM

## 2023-05-13 ENCOUNTER — Encounter (HOSPITAL_COMMUNITY): Payer: Self-pay | Admitting: General Surgery

## 2023-06-03 DIAGNOSIS — M25561 Pain in right knee: Secondary | ICD-10-CM | POA: Diagnosis not present

## 2023-06-04 DIAGNOSIS — H43813 Vitreous degeneration, bilateral: Secondary | ICD-10-CM | POA: Diagnosis not present

## 2023-06-10 ENCOUNTER — Other Ambulatory Visit (HOSPITAL_COMMUNITY): Payer: Self-pay

## 2023-06-18 DIAGNOSIS — L814 Other melanin hyperpigmentation: Secondary | ICD-10-CM | POA: Diagnosis not present

## 2023-06-18 DIAGNOSIS — D1801 Hemangioma of skin and subcutaneous tissue: Secondary | ICD-10-CM | POA: Diagnosis not present

## 2023-06-18 DIAGNOSIS — L821 Other seborrheic keratosis: Secondary | ICD-10-CM | POA: Diagnosis not present

## 2023-06-18 DIAGNOSIS — D2271 Melanocytic nevi of right lower limb, including hip: Secondary | ICD-10-CM | POA: Diagnosis not present

## 2023-06-18 DIAGNOSIS — H04123 Dry eye syndrome of bilateral lacrimal glands: Secondary | ICD-10-CM | POA: Diagnosis not present

## 2023-06-18 DIAGNOSIS — L820 Inflamed seborrheic keratosis: Secondary | ICD-10-CM | POA: Diagnosis not present

## 2023-06-18 DIAGNOSIS — Z85828 Personal history of other malignant neoplasm of skin: Secondary | ICD-10-CM | POA: Diagnosis not present

## 2023-06-18 DIAGNOSIS — D485 Neoplasm of uncertain behavior of skin: Secondary | ICD-10-CM | POA: Diagnosis not present

## 2023-06-27 DIAGNOSIS — E669 Obesity, unspecified: Secondary | ICD-10-CM | POA: Diagnosis not present

## 2023-06-27 DIAGNOSIS — Z0000 Encounter for general adult medical examination without abnormal findings: Secondary | ICD-10-CM | POA: Diagnosis not present

## 2023-06-27 DIAGNOSIS — E039 Hypothyroidism, unspecified: Secondary | ICD-10-CM | POA: Diagnosis not present

## 2023-06-27 DIAGNOSIS — D519 Vitamin B12 deficiency anemia, unspecified: Secondary | ICD-10-CM | POA: Diagnosis not present

## 2023-07-17 ENCOUNTER — Other Ambulatory Visit: Payer: Self-pay | Admitting: Gastroenterology

## 2023-07-23 DIAGNOSIS — R8290 Unspecified abnormal findings in urine: Secondary | ICD-10-CM | POA: Diagnosis not present

## 2023-07-30 DIAGNOSIS — F419 Anxiety disorder, unspecified: Secondary | ICD-10-CM | POA: Diagnosis not present

## 2023-07-30 DIAGNOSIS — R7301 Impaired fasting glucose: Secondary | ICD-10-CM | POA: Diagnosis not present

## 2023-07-30 DIAGNOSIS — E669 Obesity, unspecified: Secondary | ICD-10-CM | POA: Diagnosis not present

## 2023-07-30 DIAGNOSIS — E785 Hyperlipidemia, unspecified: Secondary | ICD-10-CM | POA: Diagnosis not present

## 2023-07-30 DIAGNOSIS — M85859 Other specified disorders of bone density and structure, unspecified thigh: Secondary | ICD-10-CM | POA: Diagnosis not present

## 2023-07-30 DIAGNOSIS — K219 Gastro-esophageal reflux disease without esophagitis: Secondary | ICD-10-CM | POA: Diagnosis not present

## 2023-07-30 DIAGNOSIS — G729 Myopathy, unspecified: Secondary | ICD-10-CM | POA: Diagnosis not present

## 2023-07-30 DIAGNOSIS — N1831 Chronic kidney disease, stage 3a: Secondary | ICD-10-CM | POA: Diagnosis not present

## 2023-07-30 DIAGNOSIS — E039 Hypothyroidism, unspecified: Secondary | ICD-10-CM | POA: Diagnosis not present

## 2023-07-30 DIAGNOSIS — Z6831 Body mass index (BMI) 31.0-31.9, adult: Secondary | ICD-10-CM | POA: Diagnosis not present

## 2023-08-12 DIAGNOSIS — K219 Gastro-esophageal reflux disease without esophagitis: Secondary | ICD-10-CM | POA: Diagnosis not present

## 2023-08-12 DIAGNOSIS — R197 Diarrhea, unspecified: Secondary | ICD-10-CM | POA: Diagnosis not present

## 2023-08-12 DIAGNOSIS — Z1389 Encounter for screening for other disorder: Secondary | ICD-10-CM | POA: Diagnosis not present

## 2023-08-12 DIAGNOSIS — E039 Hypothyroidism, unspecified: Secondary | ICD-10-CM | POA: Diagnosis not present

## 2023-08-12 DIAGNOSIS — N183 Chronic kidney disease, stage 3 unspecified: Secondary | ICD-10-CM | POA: Diagnosis not present

## 2023-08-13 DIAGNOSIS — R197 Diarrhea, unspecified: Secondary | ICD-10-CM | POA: Diagnosis not present

## 2023-08-16 ENCOUNTER — Telehealth: Payer: Self-pay | Admitting: Gastroenterology

## 2023-08-16 NOTE — Telephone Encounter (Signed)
 Patient calling in regards to scheduling ov with provider for diarrhea. Offered patient next available but patient declined due to office availability.   Requesting to speak with a nurse. Please advise.   Thank you

## 2023-08-16 NOTE — Telephone Encounter (Signed)
 Patient was scheduled for 5/8 at 8:20 AM with Brigitte Canard.

## 2023-08-29 ENCOUNTER — Encounter: Payer: Self-pay | Admitting: Physician Assistant

## 2023-08-29 ENCOUNTER — Ambulatory Visit: Admitting: Physician Assistant

## 2023-08-29 VITALS — BP 118/70 | HR 78 | Ht 62.0 in | Wt 170.0 lb

## 2023-08-29 DIAGNOSIS — K449 Diaphragmatic hernia without obstruction or gangrene: Secondary | ICD-10-CM

## 2023-08-29 DIAGNOSIS — R197 Diarrhea, unspecified: Secondary | ICD-10-CM

## 2023-08-29 DIAGNOSIS — K219 Gastro-esophageal reflux disease without esophagitis: Secondary | ICD-10-CM

## 2023-08-29 NOTE — Progress Notes (Signed)
 se     Megan Canard, PA-C [address] [place], [place]  [address] Phone: [phone]   Primary Care Physician: [name], MD  Primary Gastroenterologist:  Megan Canard, PA-C / Darol Elizabeth, MD   Chief Complaint:  Diarrhea       HPI:   Megan Booth is a 78 y.o. female is here to evaluate diarrhea which started acutely 1 month ago.  Her diarrhea lasted 20 days and spontaneously resolved.  She did not have any associated rectal bleeding, fever, chills, nausea, or vomiting.  She had some abdominal cramping which has also resolved.  No recent antibiotics, travel, or new medications.  She has city water .  She saw her PCP [name] 08/16/2023 to evaluate diarrhea.  Stool studies were negative for infections.  Labs were normal.  Was told to follow-up with GI.  Since she saw her PCP, her diarrhea has completely resolved.  She is currently having normal bowel movements with no GI symptoms.  Patient last saw [name] 04/2022 for f/u GERD.  She takes Nexium  40 Mg every morning, famotidine 20 Mg every afternoon with good control of acid reflux.  Denies any upper GI symptoms.  PMH: anxiety, prediabetes and GERD with paraesophageal hernia s/p hernia repair and fundoplication in 2021.    02/2023 Colonoscopy by [name]: 3 small (2mm - 3mm) tubular adenoma polyps removed.  Diverticulosis.  Otherwise Normal.  5 year repeat, pending health status.  10/2021 EGD by Dr. [name]: which showed a recurrence of her hernia, consistent with a paraesophageal hernia.  Barium swallows from 2021 post-op and from June 2023 showed interval enlargement of the recurrent hernia.     GI HISTORY: 06/24/2019 EGD: Endoscopy medium size hiatal hernia possible small paraesophageal component, question upper GI barium examination 06/2019 Colonoscopy: good bowel prep 3 polyps 1 to 2 mm in size ascending colon, diverticulosis.  Pathology showed 2 tubular adenomas and 1 sessile serrated, recall 3 years  (06/2022) 07/09/2019 large gastric outpouching GE junction large paraesophageal hernia measures up to 7 cm with 1.5 cm mouth/neck emptied several times with severe reflux in the high thoracic esophagus.   08/18/2019 laparoscopic paraesophageal hernia repair with fundoplication 02/15/2020 barium swallow recurrent small to moderate hiatal hernia severe GERD, no mass or stricture.  Increase PPI to twice daily.  10/10/2021 barium swallow showed enlarging hiatal hernia with possible propulsion diverticulum, no masses, spontaneous GERD with probable reflux esophagitis, no strictures or masses 10/27/2021 EGD moderate size hiatal hernia there is clear substantial paraesophageal component, typical esophageal tortuosity and foreshortening, no stenosis or strictures.  Current Outpatient Medications  Medication Sig Dispense Refill   acetaminophen  (TYLENOL ) 500 MG tablet Take 500 mg by mouth 2 (two) times daily.     Azelastine  HCl 137 MCG/SPRAY SOLN Place 1 spray into the nose daily as needed (congestion).     Cholecalciferol (VITAMIN D) 50 MCG (2000 UT) tablet Take 2,000 Units by mouth daily.     clobetasol ointment (TEMOVATE) 0.05 % Apply 1 Application topically daily as needed (irritaiton).     Cyanocobalamin  (B-12) 2500 MCG TABS Take 2,500 mcg by mouth daily.     esomeprazole  (NEXIUM ) 40 MG capsule TAKE ONE TABLET BY MOUTH ONCE DAILY BEFORE A MEAL 60 capsule 1   famotidine (PEPCID) 20 MG tablet Take 20 mg by mouth daily.     furosemide  (LASIX ) 20 MG tablet Take 1 tablet (20 mg total) by mouth daily as needed for fluid. 90 tablet 3   levothyroxine  (  SYNTHROID ) 25 MCG tablet Take 1 tablet (25 mcg total) by mouth daily. 90 tablet 3   Polyethyl Glycol-Propyl Glycol (LUBRICATING EYE DROPS OP) Place 1 drop into both eyes daily as needed (dry eyes).     REPATHA SURECLICK 140 MG/ML SOAJ Inject 140 mg into the skin every 14 (fourteen) days.     VEOZAH  45 MG TABS Take 1 tablet by mouth daily.     zolpidem  (AMBIEN )  10 MG tablet TAKE 1/2 TO 1 TABLET AT BEDTIME AS NEEDED FOR SLEEP. 45 tablet 3   No current facility-administered medications for this visit.    Allergies as of 08/29/2023   (No Known Allergies)    Past Medical History:  Diagnosis Date   Anxiety    Arthritis    Barrett esophagus    Bursitis of right hip    injection with dr Sander Crooked 06-13-2021   Cancer [facility])    left wrist skin cancer   Chronic kidney disease Stage 3    Diverticulosis    GERD (gastroesophageal reflux disease)    history of Amnesia, global, transient 2015   see hospital notes   history of right foot Tendonitis 07/15/2019   wears shoe inserts   Hx of colonic polyps    Dr. Alethia Huxley    Hyperlipidemia    Hypothyroidism    Leukoplakia, vulva    Lower back pain    Personal history of COVID-19 03/2021   mild symptoms took paxlovid all symptoms resolved   PMB (postmenopausal bleeding) 06/14/2021   Pre-diabetes    Sleep apnea    could not tolerate cpap mild osa per sleep study 5 -6 yrs ago per pt 06-14-2021   Wears partial denture lower     Past Surgical History:  Procedure Laterality Date   BREAST ENHANCEMENT SURGERY Bilateral    40 yrs ago   COLONOSCOPY  2021   DJ-MAC-suprep(good)-tics/TA/SSP   DILATATION & CURETTAGE/HYSTEROSCOPY WITH MYOSURE N/A 06/16/2021   Procedure: DILATATION & CURETTAGE/HYSTEROSCOPY WITH MYOSURE;  Surgeon: Gaspar Karma, MD;  Location: [facility];  Service: Gynecology;  Laterality: N/A;   DILATION AND CURETTAGE OF UTERUS     x 2 last 2020   HIATAL HERNIA REPAIR N/A 08/18/2019   Procedure: LAPAROSCOPIC PARAESOPHAGEAL HERNIA REPAIR WITH FUNDOPLICATION;  Surgeon: Kinsinger, Alphonso Aschoff, MD;  Location: [facility];  Service: General;  Laterality: N/A;   HYSTEROSCOPY     last done 2 yrs ago   LAPAROSCOPIC INSERTION GASTROSTOMY TUBE N/A 05/10/2023   Procedure: LAPAROSCOPIC INSERTION GASTROSTOMY TUBE;  Surgeon: Derral Flick, MD;  Location: [facility];  Service: General;   Laterality: N/A;   LASIK Bilateral    yrs ago   left wrist skin cancer area removed      5-6 yrs ago per pt on 06-14-2021   POLYPECTOMY  2021   TA/SSP   TUBAL LIGATION     yrs ago   UPPER GI ENDOSCOPY  06/24/2019   XI ROBOTIC ASSISTED HIATAL HERNIA REPAIR N/A 05/10/2023   Procedure: ROBTIC RECURRENT HIATAL HERNIA REPAIR;  Surgeon: Dorrie Gaudier, Alphonso Aschoff, MD;  Location: [facility];  Service: General;  Laterality: N/A;    Review of Systems:    All systems reviewed and negative except where noted in HPI.    Physical Exam:  BP 118/70   Pulse 78   Ht 5\' 2"  (1.575 m)   Wt 170 lb (77.1 kg)   BMI 31.09 kg/m  No LMP recorded. Patient is postmenopausal.  General: Well-nourished, well-developed in no  acute distress.  Lungs: Clear to auscultation bilaterally. Non-labored. Heart: Regular rate and rhythm, no murmurs rubs or gallops.  Abdomen: Bowel sounds are normal; Abdomen is Soft; No hepatosplenomegaly, masses or hernias;  No Abdominal Tenderness; No guarding or rebound tenderness. Neuro: Alert and oriented x 3.  Grossly intact.  Psych: Alert and cooperative, normal mood and affect.   Imaging Studies: No results found.   Assessment and Plan:   SAGEN VOILS is a 78 y.o. y/o female presents for follow-up of acute diarrhea which started 1 month ago.  Her diarrhea lasted 20 days and has spontaneously resolved.  Currently she is not having any more diarrhea.  Denies any current GI symptoms.  Had recent negative stool studies and normal labs through her PCP.  Suspect she had a viral or bacterial gastroenteritis which has resolved.  Acute diarrhea, currently resolved.  Suspect acute gastroenteritis which has resolved. - Reassurance. - Continue drinking 64 ounces of fluids daily. - Follow-up with GI if she has any recurrent diarrhea in the future.  2. GERD/recurrent paraesophageal hernia -controlled on current treatment. - Continue Nexium  40 mg PO qam - Continue Pepcid 20 mg PO qhs -  Take OTC antiacid prn. - Dietary modifications (small evening meal, low fat/fiber)  Megan Canard, PA-C  Follow up as needed if recurrent GI symptoms.

## 2023-08-29 NOTE — Patient Instructions (Signed)
 Follow up as needed.  Due to recent changes in healthcare laws, you may see the results of your imaging and laboratory studies on MyChart before your provider has had a chance to review them.  We understand that in some cases there may be results that are confusing or concerning to you. Not all laboratory results come back in the same time frame and the provider may be waiting for multiple results in order to interpret others.  Please give [place] 48 hours in order for your provider to thoroughly review all the results before contacting the office for clarification of your results.   It was a pleasure to see you today!  Thank you for trusting me with your gastrointestinal care!

## 2023-09-18 NOTE — Progress Notes (Signed)
 Agree with the assessment and plan as outlined by Brigitte Canard, PA-C.

## 2023-09-23 ENCOUNTER — Telehealth: Payer: Self-pay | Admitting: Physician Assistant

## 2023-09-23 DIAGNOSIS — R197 Diarrhea, unspecified: Secondary | ICD-10-CM

## 2023-09-23 NOTE — Telephone Encounter (Signed)
 Inbound call from patient stating she has been having diarrhea and requesting to speak with Brian Campanile further. Please advise, thank you

## 2023-09-23 NOTE — Telephone Encounter (Signed)
 Patient was seen 08/29/23 for diarrhea. At that time, symptoms had already resolved. She now states that she began having diarrhea again a couple weeks ago. Says she has watery bowel movements about every 30 minutes for about 5 hours per day before the symptoms resolve. She denies any hematochezia, melena, no abdominal pain. Patient says that she has tried "all the over the counter medicines and none work." She was unable to tell me specific medications that she may have tried. No recent antibiotic usage, no recent sick contacts.  Tina-please advise.Aaron Aas

## 2023-09-23 NOTE — Telephone Encounter (Signed)
 Left message for patient to call back

## 2023-09-23 NOTE — Telephone Encounter (Signed)
 Patient is advised that she should pick up stool kit from 3rd floor front desk to check to GI pathogen and should return as soon a possible to our lab. Advised pushing fluids to at least 64 oz daily and following bland/brat diet. Patient states that she is already doing this.

## 2023-09-23 NOTE — Telephone Encounter (Signed)
 Brigitte Canard, PA-C  You1 hour ago (1:41 PM)   I recommend order [facility] GI panel with C. difficile to check for infections to cause diarrhea.  If [facility] GI panel is negative, then patient can take Imodium to help diarrhea.  Encouraged her to drink 64 ounces of fluids daily and eat brat diet. Brigitte Canard, PA-C

## 2023-09-26 ENCOUNTER — Encounter: Payer: Self-pay | Admitting: Gastroenterology

## 2023-09-27 ENCOUNTER — Telehealth: Payer: Self-pay | Admitting: Physician Assistant

## 2023-09-27 NOTE — Telephone Encounter (Signed)
 Please call and notify patient stool test is negative for all infections.  No evidence of infection to cause diarrhea.  Patient can take OTC Imodium to help her diarrhea.  She can also try OTC FiberCon 2 tablets twice daily which can help diarrhea.  I recommend follow-up office visit if diarrhea is not improving. Brigitte Canard, PA-C

## 2023-09-30 ENCOUNTER — Encounter: Payer: Self-pay | Admitting: Gastroenterology

## 2023-10-01 NOTE — Telephone Encounter (Signed)
 Patient has been informed of results. Patient voiced understanding of recommendations. Patient is scheduled for follow on 10/16/23 with Santina Cull, PA-C.

## 2023-10-01 NOTE — Progress Notes (Deleted)
 Brigitte Canard, PA-C [address] [place], [place]  [address] Phone: [phone]   Primary Care Physician: [name], MD  Primary Gastroenterologist:  Brigitte Canard, PA-C / Darol Elizabeth, MD   Chief Complaint: Follow-up diarrhea       HPI:   Megan Booth is a 78 y.o. female returns for 1 month follow-up of diarrhea.  I last saw her 08/29/2023.  Diarrhea started July 23, 2023.  Acute severe diarrhea initially.  When I saw her a month ago, her diarrhea had resolved.  She saw her PCP 08/16/2023.  Stool studies were negative for infection.  In the past month she had recurrent diarrhea.  I ordered repeat stool studies.  [facility] GI panel and C. difficile stool test were all negative.  Current symptoms: She tried Imodium and FiberCon tablets.  Still having diarrhea.  PMH: anxiety, prediabetes and GERD with paraesophageal hernia s/p hernia repair and fundoplication in 2021.     02/2023 Colonoscopy by [name]: 3 small (2mm - 3mm) tubular adenoma polyps removed.  Diverticulosis.  Otherwise Normal.  5 year repeat, pending health status (due 02/2028).   10/2021 EGD by Dr. [name]: which showed a recurrence of her hernia, consistent with a paraesophageal hernia.  Barium swallows from 2021 post-op and from June 2023 showed interval enlargement of the recurrent hernia.  Current Outpatient Medications  Medication Sig Dispense Refill   acetaminophen  (TYLENOL ) 500 MG tablet Take 500 mg by mouth 2 (two) times daily.     Azelastine  HCl 137 MCG/SPRAY SOLN Place 1 spray into the nose daily as needed (congestion).     Cholecalciferol (VITAMIN D) 50 MCG (2000 UT) tablet Take 2,000 Units by mouth daily.     clobetasol ointment (TEMOVATE) 0.05 % Apply 1 Application topically daily as needed (irritaiton).     Cyanocobalamin  (B-12) 2500 MCG TABS Take 2,500 mcg by mouth daily.     esomeprazole  (NEXIUM ) 40 MG capsule TAKE ONE TABLET BY MOUTH ONCE DAILY BEFORE A MEAL 60 capsule 1    famotidine (PEPCID) 20 MG tablet Take 20 mg by mouth daily.     furosemide  (LASIX ) 20 MG tablet Take 1 tablet (20 mg total) by mouth daily as needed for fluid. 90 tablet 3   levothyroxine  (SYNTHROID ) 25 MCG tablet Take 1 tablet (25 mcg total) by mouth daily. 90 tablet 3   Polyethyl Glycol-Propyl Glycol (LUBRICATING EYE DROPS OP) Place 1 drop into both eyes daily as needed (dry eyes).     REPATHA SURECLICK 140 MG/ML SOAJ Inject 140 mg into the skin every 14 (fourteen) days.     VEOZAH  45 MG TABS Take 1 tablet by mouth daily.     zolpidem  (AMBIEN ) 10 MG tablet TAKE 1/2 TO 1 TABLET AT BEDTIME AS NEEDED FOR SLEEP. 45 tablet 3   No current facility-administered medications for this visit.    Allergies as of 10/02/2023   (No Known Allergies)    Past Medical History:  Diagnosis Date   Anxiety    Arthritis    Barrett esophagus    Bursitis of right hip    injection with dr Sander Crooked 06-13-2021   Cancer [facility])    left wrist skin cancer   Chronic kidney disease Stage 3    Diverticulosis    GERD (gastroesophageal reflux disease)    history of Amnesia, global, transient 2015   see hospital notes   history of right foot Tendonitis 07/15/2019   wears shoe inserts   Hx  of colonic polyps    Dr. Alethia Huxley    Hyperlipidemia    Hypothyroidism    Leukoplakia, vulva    Lower back pain    Personal history of COVID-19 03/2021   mild symptoms took paxlovid all symptoms resolved   PMB (postmenopausal bleeding) 06/14/2021   Pre-diabetes    Sleep apnea    could not tolerate cpap mild osa per sleep study 5 -6 yrs ago per pt 06-14-2021   Wears partial denture lower     Past Surgical History:  Procedure Laterality Date   BREAST ENHANCEMENT SURGERY Bilateral    40 yrs ago   COLONOSCOPY  2021   DJ-MAC-suprep(good)-tics/TA/SSP   DILATATION & CURETTAGE/HYSTEROSCOPY WITH MYOSURE N/A 06/16/2021   Procedure: DILATATION & CURETTAGE/HYSTEROSCOPY WITH MYOSURE;  Surgeon: Gaspar Karma, MD;  Location: [facility];  Service: Gynecology;  Laterality: N/A;   DILATION AND CURETTAGE OF UTERUS     x 2 last 2020   HIATAL HERNIA REPAIR N/A 08/18/2019   Procedure: LAPAROSCOPIC PARAESOPHAGEAL HERNIA REPAIR WITH FUNDOPLICATION;  Surgeon: Kinsinger, Alphonso Aschoff, MD;  Location: [facility];  Service: General;  Laterality: N/A;   HYSTEROSCOPY     last done 2 yrs ago   LAPAROSCOPIC INSERTION GASTROSTOMY TUBE N/A 05/10/2023   Procedure: LAPAROSCOPIC INSERTION GASTROSTOMY TUBE;  Surgeon: Derral Flick, MD;  Location: [facility];  Service: General;  Laterality: N/A;   LASIK Bilateral    yrs ago   left wrist skin cancer area removed      5-6 yrs ago per pt on 06-14-2021   POLYPECTOMY  2021   TA/SSP   TUBAL LIGATION     yrs ago   UPPER GI ENDOSCOPY  06/24/2019   XI ROBOTIC ASSISTED HIATAL HERNIA REPAIR N/A 05/10/2023   Procedure: ROBTIC RECURRENT HIATAL HERNIA REPAIR;  Surgeon: Dorrie Gaudier, Alphonso Aschoff, MD;  Location: [facility];  Service: General;  Laterality: N/A;    Review of Systems:    All systems reviewed and negative except where noted in HPI.    Physical Exam:  There were no vitals taken for this visit. No LMP recorded. Patient is postmenopausal.  General: Well-nourished, well-developed in no acute distress.  Lungs: Clear to auscultation bilaterally. Non-labored. Heart: Regular rate and rhythm, no murmurs rubs or gallops.  Abdomen: Bowel sounds are normal; Abdomen is Soft; No hepatosplenomegaly, masses or hernias;  No Abdominal Tenderness; No guarding or rebound tenderness. Neuro: Alert and oriented x 3.  Grossly intact.  Psych: Alert and cooperative, normal mood and affect.   Imaging Studies: No results found.  Labs: CBC    Component Value Date/Time   WBC 11.6 (H) 05/11/2023 0539   RBC 3.94 05/11/2023 0539   HGB 11.9 (L) 05/11/2023 0539   HCT 37.5 05/11/2023 0539   PLT 257 05/11/2023 0539   MCV 95.2 05/11/2023 0539   MCH 30.2 05/11/2023 0539   MCHC 31.7 05/11/2023 0539   RDW  13.6 05/11/2023 0539   LYMPHSABS 1.9 01/05/2022 1107   MONOABS 0.5 01/05/2022 1107   EOSABS 0.2 01/05/2022 1107   BASOSABS 0.0 01/05/2022 1107    CMP     Component Value Date/Time   NA 139 05/11/2023 0539   K 4.6 05/11/2023 0539   CL 106 05/11/2023 0539   CO2 28 05/11/2023 0539   GLUCOSE 123 (H) 05/11/2023 0539   BUN 18 05/11/2023 0539   CREATININE 0.97 05/11/2023 0539   CALCIUM  9.4 05/11/2023 0539   PROT 6.4 01/05/2022 1107   ALBUMIN  3.9  01/05/2022 1107   AST 18 01/05/2022 1107   ALT 20 01/05/2022 1107   ALKPHOS 93 01/05/2022 1107   BILITOT 0.4 01/05/2022 1107   GFRNONAA >60 05/11/2023 0539   GFRAA >60 08/19/2019 0417       Assessment and Plan:   ULA COUVILLON is a 78 y.o. y/o female presents for recurrent diarrhea for 2 months.  All stool studies have been negative for infectious pathogens, including C. difficile, in the past 2 months.  1.  Recurrent persistent diarrhea - Labs: Celiac panel, CBC, CMP, CRP, TSH - Repeat colonoscopy -check biopsies for microscopic colitis    Brigitte Canard, PA-C  Follow up ***

## 2023-10-02 ENCOUNTER — Ambulatory Visit: Admitting: Physician Assistant

## 2023-10-02 NOTE — Telephone Encounter (Signed)
 It appears patient is scheduled to see Brigitte Canard, PA-C on today, 10/02/23 at 1040 am.

## 2023-10-03 ENCOUNTER — Ambulatory Visit: Admitting: Physician Assistant

## 2023-10-07 ENCOUNTER — Encounter: Payer: Self-pay | Admitting: Physician Assistant

## 2023-10-09 DIAGNOSIS — M9901 Segmental and somatic dysfunction of cervical region: Secondary | ICD-10-CM | POA: Diagnosis not present

## 2023-10-09 DIAGNOSIS — M5411 Radiculopathy, occipito-atlanto-axial region: Secondary | ICD-10-CM | POA: Diagnosis not present

## 2023-10-15 NOTE — Progress Notes (Unsigned)
 10/16/2023 Megan Booth [id number] 01-Oct-1945  Referring provider: Stephane Leita DEL, MD Primary GI doctor: Dr. Stacia  ASSESSMENT AND PLAN:  Diarrhea 02/2023 Colonoscopy by Dr. Stacia: 3 small (2mm - 3mm) tubular adenoma polyps removed.  Diverticulosis.  Otherwise Normal.  5 year repeat, pending health status. Negative stool studies x 2 Now with formed stools in the morning with some discomfort and 1-2 loose stools later in the day, associated bloating, has mild AB cramping prior improved after BM, urgency, no hematochezia Worse with lettuce, popcorn, cabbage Has been on repatha last 3-6 months veozah  for the last year Trial of restora without help Possible post infectious IBS, SIBO, pelvic floor, from new medications, rule out colitis, inflammation, if all negative consider repeat colon for microscopic colitis however with now formed stools in the morning, less likely -with symptoms of bloating, urgency, AB cramping and loose/abnormal stools, will proceed with CT AB and pelvis to evaluate - trial of flagyl  for SIBO  -CRP/ESR to rule out inflammation, consider fecal calprotectin if elevated - TSH  -Can do trial of IBGARD daily -Add on citracel/benefiber -FODMAP, trial off lactulose and lifestyle changes discussed -Imodium as needed for now, can take prior to eating or travel - possible pelvic floor component, consider referral to PT, information given -Pending labs and response to medication, can consider endoscopic evaluation for microscopic colitis  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 10/2021 EGD by Dr. Teressa: which showed a recurrence of her hernia, consistent with a paraesophageal hernia.  Barium swallows from 2021 post-op and from June 2023 showed interval enlargement of the recurrent hernia. No symptoms at this time   Patient Care Team: Stephane Leita DEL, MD as PCP - General (Internal Medicine)  HISTORY OF PRESENT ILLNESS: 78  y.o. female with a past medical history listed below presents for evaluation of 1 month follow-up of diarrhea.   Last seen by Ellouise Console on 08/29/2023.  Diarrhea started July 23, 2023.  Acute severe diarrhea initially but had resolved.   Stool studies were negative for infection. Diarrhea reoccurred repeat Diatherix GI pathogen and C. difficile negative  Discussed the use of AI scribe software for clinical note transcription with the patient, who gave verbal consent to proceed.  History of Present Illness   Megan Booth is a 78 year old female who presents with chronic diarrhea and bowel habit changes.  She has experienced diarrhea since April, initially persisting for six weeks before resolving on May 22. During this period, she had frequent, loose stools occurring five to six times over a four-hour period, necessitating proximity to a bathroom. Stool studies conducted during this time were negative.  Since the resolution of the initial episode, her bowel movements have been influenced by dietary intake, with certain foods like salad, popcorn, and cabbage triggering episodes of diarrhea. Her current bowel habits include formed stools in the morning followed by one or two loose stools later in the day, typically having two to three bowel movements daily. She experiences cramping and urgency prior to loose stools, but no blood in the stool, pain, or discomfort aside from the urgency and cramping.  She reports bloating and occasional difficulty passing formed stools, which requires straining. She initially denied any recent changes in medication, but then recalled starting Repatha earlier this year, and has been on Nexium  and Ambien  for a longer period. She uses Imodium as needed for episodes of diarrhea.  Her past medical history includes a recent colonoscopy  less than a year ago, which was normal at the time. She has tried probiotics without noticeable improvement. She follows a diet that  includes vitamin D and B12 supplements, and occasionally follows the BRAT diet to manage symptoms.  No fever, chills, nausea, vomiting, heartburn, trouble swallowing, shortness of breath, or rectal pain. No urinary issues except for occasional odor in urine, which was evaluated and found to be non-infectious. No new over-the-counter supplements.      She  reports that she has never smoked. She has never used smokeless tobacco. She reports that she does not currently use alcohol  after a past usage of about 1.0 standard drink of alcohol  per week. She reports that she does not use drugs.  RELEVANT GI HISTORY, IMAGING AND LABS: Results   LABS Stool studies: Negative (April 2025)  DIAGNOSTIC Colonoscopy: Normal (2024)     02/2023 Colonoscopy by Dr. Stacia: 3 small (2mm - 3mm) tubular adenoma polyps removed.  Diverticulosis.  Otherwise Normal.  5 year repeat, pending health status (due 02/2028).   10/2021 EGD by Dr. Teressa: which showed a recurrence of her hernia, consistent with a paraesophageal hernia.  Barium swallows from 2021 post-op and from June 2023 showed interval enlargement of the recurrent hernia. CBC    Component Value Date/Time   WBC 11.6 (H) 05/11/2023 0539   RBC 3.94 05/11/2023 0539   HGB 11.9 (L) 05/11/2023 0539   HCT 37.5 05/11/2023 0539   PLT 257 05/11/2023 0539   MCV 95.2 05/11/2023 0539   MCH 30.2 05/11/2023 0539   MCHC 31.7 05/11/2023 0539   RDW 13.6 05/11/2023 0539   LYMPHSABS 1.9 01/05/2022 1107   MONOABS 0.5 01/05/2022 1107   EOSABS 0.2 01/05/2022 1107   BASOSABS 0.0 01/05/2022 1107   Recent Labs    05/02/23 1319 05/10/23 1509 05/11/23 0539  HGB 14.5 12.9 11.9*    CMP     Component Value Date/Time   NA 139 05/11/2023 0539   K 4.6 05/11/2023 0539   CL 106 05/11/2023 0539   CO2 28 05/11/2023 0539   GLUCOSE 123 (H) 05/11/2023 0539   BUN 18 05/11/2023 0539   CREATININE 0.97 05/11/2023 0539   CALCIUM  9.4 05/11/2023 0539   PROT 6.4 01/05/2022 1107    ALBUMIN  3.9 01/05/2022 1107   AST 18 01/05/2022 1107   ALT 20 01/05/2022 1107   ALKPHOS 93 01/05/2022 1107   BILITOT 0.4 01/05/2022 1107   GFRNONAA >60 05/11/2023 0539   GFRAA >60 08/19/2019 0417      Latest Ref Rng & Units 01/05/2022   11:07 AM 10/27/2020   11:36 AM 09/22/2019    8:41 AM  Hepatic Function  Total Protein 6.0 - 8.3 g/dL 6.4  6.6  6.3   Albumin  3.5 - 5.2 g/dL 3.9  4.2  4.0   AST 0 - 37 U/L 18  17  13    ALT 0 - 35 U/L 20  20  13    Alk Phosphatase 39 - 117 U/L 93  62  79   Total Bilirubin 0.2 - 1.2 mg/dL 0.4  0.7  0.3       Current Medications:   Current Outpatient Medications (Endocrine & Metabolic):    levothyroxine  (SYNTHROID ) 25 MCG tablet, Take 1 tablet (25 mcg total) by mouth daily.   VEOZAH  45 MG TABS, Take 1 tablet by mouth daily.  Current Outpatient Medications (Cardiovascular):    furosemide  (LASIX ) 20 MG tablet, Take 1 tablet (20 mg total) by mouth daily as needed for  fluid.   REPATHA SURECLICK 140 MG/ML SOAJ, Inject 140 mg into the skin every 14 (fourteen) days.  Current Outpatient Medications (Respiratory):    Azelastine  HCl 137 MCG/SPRAY SOLN, Place 1 spray into the nose daily as needed (congestion).  Current Outpatient Medications (Analgesics):    acetaminophen  (TYLENOL ) 500 MG tablet, Take 500 mg by mouth 2 (two) times daily.  Current Outpatient Medications (Hematological):    Cyanocobalamin  (B-12) 2500 MCG TABS, Take 2,500 mcg by mouth daily.  Current Outpatient Medications (Other):    Cholecalciferol (VITAMIN D) 50 MCG (2000 UT) tablet, Take 2,000 Units by mouth daily.   clobetasol ointment (TEMOVATE) 0.05 %, Apply 1 Application topically daily as needed (irritaiton).   esomeprazole  (NEXIUM ) 40 MG capsule, TAKE ONE TABLET BY MOUTH ONCE DAILY BEFORE A MEAL   metroNIDAZOLE  (FLAGYL ) 250 MG tablet, Take 1 tablet (250 mg total) by mouth 3 (three) times daily for 10 days.   Polyethyl Glycol-Propyl Glycol (LUBRICATING EYE DROPS OP), Place 1 drop  into both eyes daily as needed (dry eyes).   zolpidem  (AMBIEN ) 10 MG tablet, TAKE 1/2 TO 1 TABLET AT BEDTIME AS NEEDED FOR SLEEP.   famotidine (PEPCID) 20 MG tablet, Take 20 mg by mouth daily.  Medical History:  Past Medical History:  Diagnosis Date   Anxiety    Arthritis    Barrett esophagus    Bursitis of right hip    injection with dr isla 06-13-2021   Cancer [facility])    left wrist skin cancer   Chronic kidney disease Stage 3    Diverticulosis    GERD (gastroesophageal reflux disease)    history of Amnesia, global, transient 2015   see hospital notes   history of right foot Tendonitis 07/15/2019   wears shoe inserts   Hx of colonic polyps    Dr. Geroge    Hyperlipidemia    Hypothyroidism    Leukoplakia, vulva    Lower back pain    Personal history of COVID-19 03/2021   mild symptoms took paxlovid all symptoms resolved   PMB (postmenopausal bleeding) 06/14/2021   Pre-diabetes    Sleep apnea    could not tolerate cpap mild osa per sleep study 5 -6 yrs ago per pt 06-14-2021   Wears partial denture lower    Allergies: No Known Allergies   Surgical History:  She  has a past surgical history that includes LASIK (Bilateral); Breast enhancement surgery (Bilateral); Dilation and curettage of uterus; Hysteroscopy; Tubal ligation; Hiatal hernia repair (N/A, 08/18/2019); Upper gi endoscopy (06/24/2019); left wrist skin cancer area removed ; Dilatation & curettage/hysteroscopy with myosure (N/A, 06/16/2021); Colonoscopy (2021); Polypectomy (2021); Xi robotic assisted hiatal hernia repair (N/A, 05/10/2023); and Laparoscopic insertion gastrostomy tube (N/A, 05/10/2023). Family History:  Her family history includes Cancer (age of onset: 9) in her father; Dementia in her mother; Depression in her mother; Diabetes in her mother; Heart disease in her mother; Hypertension in her mother; Seizures in her brother.  REVIEW OF SYSTEMS  : All other systems reviewed and negative except where noted in  the History of Present Illness.  PHYSICAL EXAM: BP 136/80   Pulse (!) 59   Ht 5' 4 (1.626 m)   Wt 176 lb (79.8 kg)   BMI 30.21 kg/m  Physical Exam   GENERAL APPEARANCE: Well nourished, in no apparent distress. HEENT: No cervical lymphadenopathy, unremarkable thyroid , sclerae anicteric, conjunctiva pink. RESPIRATORY: Respiratory effort normal, breath sounds equal bilaterally without rales, rhonchi, or wheezing. CARDIO: Regular rate and rhythm with no  murmurs, rubs, or gallops, peripheral pulses intact. ABDOMEN: Soft, non-distended, active bowel sounds in all four quadrants, non-tender to palpation, no rebound, no mass appreciated. RECTAL: Declines. MUSCULOSKELETAL: Full range of motion, normal gait, without edema. SKIN: Dry, intact without rashes or lesions. No jaundice. NEURO: Alert, oriented, no focal deficits. PSYCH: Cooperative, normal mood and affect.      Alan JONELLE Coombs, PA-C 9:09 AM

## 2023-10-16 ENCOUNTER — Ambulatory Visit: Admitting: Physician Assistant

## 2023-10-16 ENCOUNTER — Encounter: Payer: Self-pay | Admitting: Physician Assistant

## 2023-10-16 ENCOUNTER — Other Ambulatory Visit (INDEPENDENT_AMBULATORY_CARE_PROVIDER_SITE_OTHER)

## 2023-10-16 VITALS — BP 136/80 | HR 59 | Ht 64.0 in | Wt 176.0 lb

## 2023-10-16 DIAGNOSIS — Z860101 Personal history of adenomatous and serrated colon polyps: Secondary | ICD-10-CM | POA: Diagnosis not present

## 2023-10-16 DIAGNOSIS — R197 Diarrhea, unspecified: Secondary | ICD-10-CM

## 2023-10-16 DIAGNOSIS — K449 Diaphragmatic hernia without obstruction or gangrene: Secondary | ICD-10-CM | POA: Diagnosis not present

## 2023-10-16 DIAGNOSIS — K219 Gastro-esophageal reflux disease without esophagitis: Secondary | ICD-10-CM

## 2023-10-16 DIAGNOSIS — E039 Hypothyroidism, unspecified: Secondary | ICD-10-CM | POA: Diagnosis not present

## 2023-10-16 LAB — CBC WITH DIFFERENTIAL/PLATELET
Basophils Absolute: 0.1 10*3/uL (ref 0.0–0.1)
Basophils Relative: 0.7 % (ref 0.0–3.0)
Eosinophils Absolute: 0.2 10*3/uL (ref 0.0–0.7)
Eosinophils Relative: 3.1 % (ref 0.0–5.0)
HCT: 42.5 % (ref 36.0–46.0)
Hemoglobin: 14 g/dL (ref 12.0–15.0)
Lymphocytes Relative: 26.5 % (ref 12.0–46.0)
Lymphs Abs: 2.1 10*3/uL (ref 0.7–4.0)
MCHC: 32.8 g/dL (ref 30.0–36.0)
MCV: 90.3 fl (ref 78.0–100.0)
Monocytes Absolute: 0.7 10*3/uL (ref 0.1–1.0)
Monocytes Relative: 8.7 % (ref 3.0–12.0)
Neutro Abs: 4.9 10*3/uL (ref 1.4–7.7)
Neutrophils Relative %: 61 % (ref 43.0–77.0)
Platelets: 274 10*3/uL (ref 150.0–400.0)
RBC: 4.71 Mil/uL (ref 3.87–5.11)
RDW: 14.2 % (ref 11.5–15.5)
WBC: 8 10*3/uL (ref 4.0–10.5)

## 2023-10-16 LAB — TSH: TSH: 4.46 u[IU]/mL (ref 0.35–5.50)

## 2023-10-16 LAB — COMPREHENSIVE METABOLIC PANEL WITH GFR
ALT: 22 U/L (ref 0–35)
AST: 21 U/L (ref 0–37)
Albumin: 4.2 g/dL (ref 3.5–5.2)
Alkaline Phosphatase: 101 U/L (ref 39–117)
BUN: 16 mg/dL (ref 6–23)
CO2: 31 meq/L (ref 19–32)
Calcium: 10.4 mg/dL (ref 8.4–10.5)
Chloride: 104 meq/L (ref 96–112)
Creatinine, Ser: 1.02 mg/dL (ref 0.40–1.20)
GFR: 53.64 mL/min — ABNORMAL LOW (ref >60.00)
Glucose, Bld: 101 mg/dL — ABNORMAL HIGH (ref 70–99)
Potassium: 4 meq/L (ref 3.5–5.1)
Sodium: 142 meq/L (ref 135–145)
Total Bilirubin: 0.5 mg/dL (ref 0.2–1.2)
Total Protein: 6.9 g/dL (ref 6.0–8.3)

## 2023-10-16 LAB — SEDIMENTATION RATE: Sed Rate: 7 mm/h (ref 0–30)

## 2023-10-16 MED ORDER — METRONIDAZOLE 250 MG PO TABS: 250.0000 mg | ORAL_TABLET | Freq: Three times a day (TID) | ORAL | 0 refills | Status: AC

## 2023-10-16 NOTE — Patient Instructions (Signed)
 Your provider has requested that you go to the basement level for lab work before leaving today. Press B on the elevator. The lab is located at the first door on the left as you exit the elevator.  FIBER SUPPLEMENT You can do metamucil or fibercon once or twice a day but if this causes gas/bloating please switch to Benefiber or Citracel.  Fiber is good for constipation/diarrhea/irritable bowel syndrome.  It can also help with weight loss and can help lower your bad cholesterol (LDL).  Please do 1 TBSP in the morning in water , coffee, or tea.  It can take up to a month before you can see a difference with your bowel movements.  It is cheapest from [employer], [employer], [employer].    Toileting tips to help with your constipation - Drink at least 64-80 ounces of water /liquid per day. - Establish a time to try to move your bowels every day.  For many people, this is after a cup of coffee or after a meal such as breakfast. - Sit all of the way back on the toilet keeping your back fairly straight and while sitting up, try to rest the tops of your forearms on your upper thighs.   - Raising your feet with a step stool/squatty potty can be helpful to improve the angle that allows your stool to pass through the rectum. - Relax the rectum feeling it bulge toward the toilet water .  If you feel your rectum raising toward your body, you are contracting rather than relaxing. - Breathe in and slowly exhale. Belly breath by expanding your belly towards your belly button. Keep belly expanded as you gently direct pressure down and back to the anus.  A low pitched GRRR sound can assist with increasing intra-abdominal pressure.  (Can also trying to blow on a pinwheel and make it move, this helps with the same belly breathing) - Repeat 3-4 times. If unsuccessful, contract the pelvic floor to restore normal tone and get off the toilet.  Avoid excessive straining. - To reduce excessive wiping by teaching your anus to  normally contract, place hands on outer aspect of knees and resist knee movement outward.  Hold 5-10 second then place hands just inside of knees and resist inward movement of knees.  Hold 5 seconds.  Repeat a few times each way.  Go to the ER if unable to pass gas, severe AB pain, unable to hold down food, any shortness of breath of chest pain.  Here some information about pelvic floor dysfunction. This may be contributing to some of your symptoms. We will continue with our evaluation but I do want you to consider adding on fiber supplement with low-dose MiraLAX daily. We could also refer to pelvic floor physical therapy.   Pelvic Floor Dysfunction, Female Pelvic floor dysfunction (PFD) is a condition that results when the group of muscles and connective tissues that support the organs in the pelvis (pelvic floor muscles) do not work well. These muscles and their connections form a sling that supports the colon and bladder. In women, they also support the uterus. PFD causes pelvic floor muscles to be too weak, too tight, or both. In PFD, muscle movements are not coordinated. This may cause bowel or bladder problems. It may also cause pain. What are the causes? This condition may be caused by an injury to the pelvic area or by a weakening of pelvic muscles. This often results from pregnancy and childbirth or other types of strain. In many cases, the  exact cause is not known. What increases the risk? The following factors may make you more likely to develop this condition: Having chronic bladder tissue inflammation (interstitial cystitis). Being an older person. Being overweight. History of radiation treatment for cancer in the pelvic region. Previous pelvic surgery, such as removal of the uterus (hysterectomy). What are the signs or symptoms? Symptoms of this condition vary and may include: Bladder symptoms, such as: Trouble starting urination and emptying the bladder. Frequent urinary  tract infections. Leaking urine when coughing, laughing, or exercising (stress incontinence). Having to pass urine urgently or frequently. Pain when passing urine. Bowel symptoms, such as: Constipation. Urgent or frequent bowel movements. Incomplete bowel movements. Painful bowel movements. Leaking stool or gas. Unexplained genital or rectal pain. Genital or rectal muscle spasms. Low back pain. Other symptoms may include: A heavy, full, or aching feeling in the vagina. A bulge that protrudes into the vagina. Pain during or after sex. How is this diagnosed? This condition may be diagnosed based on: Your symptoms and medical history. A physical exam. During the exam, your health care provider may check your pelvic muscles for tightness, spasm, pain, or weakness. This may include a rectal exam and a pelvic exam. In some cases, you may have diagnostic tests, such as: Electrical muscle function tests. Urine flow testing. X-ray tests of bowel function. Ultrasound of the pelvic organs. How is this treated? Treatment for this condition depends on the symptoms. Treatment options include: Physical therapy. This may include Kegel exercises to help relax or strengthen the pelvic floor muscles. Biofeedback. This type of therapy provides feedback on how tight your pelvic floor muscles are so that you can learn to control them. Internal or external massage therapy. A treatment that involves electrical stimulation of the pelvic floor muscles to help control pain (transcutaneous electrical nerve stimulation, or TENS). Sound wave therapy (ultrasound) to reduce muscle spasms. Medicines, such as: Muscle relaxants. Bladder control medicines. Surgery to reconstruct or support pelvic floor muscles may be an option if other treatments do not help. Follow these instructions at home: Activity Do your usual activities as told by your health care provider. Ask your health care provider if you should  modify any activities. Do pelvic floor strengthening or relaxing exercises at home as told by your physical therapist. Lifestyle Maintain a healthy weight. Eat foods that are high in fiber, such as beans, whole grains, and fresh fruits and vegetables. Limit foods that are high in fat and processed sugars, such as fried or sweet foods. Manage stress with relaxation techniques such as yoga or meditation. General instructions If you have problems with leakage: Use absorbable pads or wear padded underwear. Wash frequently with mild soap. Keep your genital and anal area as clean and dry as possible. Ask your health care provider if you should try a barrier cream to prevent skin irritation. Take warm baths to relieve pelvic muscle tension or spasms. Take over-the-counter and prescription medicines only as told by your health care provider. Keep all follow-up visits. How is this prevented? The cause of PFD is not always known, but there are a few things you can do to reduce the risk of developing this condition, including: Staying at a healthy weight. Getting regular exercise. Managing stress. Contact a health care provider if: Your symptoms are not improving with home care. You have signs or symptoms of PFD that get worse at home. You develop new signs or symptoms. You have signs of a urinary tract infection, such as: Fever.  Chills. Increased urinary frequency. A burning feeling when urinating. You have not had a bowel movement in 3 days (constipation). Summary Pelvic floor dysfunction results when the muscles and connective tissues in your pelvic floor do not work well. These muscles and their connections form a sling that supports your colon and bladder. In women, they also support the uterus. PFD may be caused by an injury to the pelvic area or by a weakening of pelvic muscles. PFD causes pelvic floor muscles to be too weak, too tight, or a combination of both. Symptoms may vary from  person to person. In most cases, PFD can be treated with physical therapies and medicines. Surgery may be an option if other treatments do not help. This information is not intended to replace advice given to you by your health care provider. Make sure you discuss any questions you have with your health care provider. Document Revised: 08/17/2020 Document Reviewed: 08/17/2020 Elsevier Patient Education  2022 [employer]  Small intestinal bacterial overgrowth (SIBO) occurs when there is an abnormal increase in the overall bacterial population in the small intestine -- particularly types of bacteria not commonly found in that part of the digestive tract. Small intestinal bacterial overgrowth (SIBO) commonly results when a circumstance -- such as surgery or disease -- slows the passage of food and waste products in the digestive tract, creating a breeding ground for bacteria.  Signs and symptoms of SIBO often include: Loss of appetite Abdominal pain Nausea Bloating An uncomfortable feeling of fullness after eating Diarrhea or constipation, depending on the type of gas produced  What foods trigger SIBO? While foods aren't the original cause of SIBO, certain foods do encourage the overgrowth of the wrong bacteria in your small intestine. If you're feeding them their favorite foods, they're going to grow more, and that will trigger more of your SIBO symptoms. By the same token, you can help reduce the overgrowth by starving the problematic bacteria of their favorite foods. This strategy has led to a number of proposed SIBO eating plans. The plans vary, and so do individual results. But in general, they tend to recommend limiting carbohydrates.  These include: Sugars and sweeteners. Fruits and starchy vegetables. Dairy products. Grains.  There is a test for this we can do called a breath test, if you are positive we will treat you with an antibiotic to see if it helps.  Your symptoms are very  suspicious for this condition, as discussed, we will start you on an antibiotic to see if this helps.     You have been scheduled for a CT scan of the abdomen and pelvis at [facility], 1st floor Radiology. You are scheduled on 10/29/2023 at 2:30 pm to drink oral contrast, your scan will be at 4:30.  Call  [phone] to reschedule if needed  Due to recent changes in healthcare laws, you may see the results of your imaging and laboratory studies on MyChart before your provider has had a chance to review them.  We understand that in some cases there may be results that are confusing or concerning to you. Not all laboratory results come back in the same time frame and the provider may be waiting for multiple results in order to interpret others.  Please give us  48 hours in order for your provider to thoroughly review all the results before contacting the office for clarification of your results.    I appreciate the  opportunity to care for you  Thank You  Amanda Collier,PA-C

## 2023-10-17 ENCOUNTER — Ambulatory Visit: Payer: Self-pay | Admitting: Physician Assistant

## 2023-10-17 NOTE — Progress Notes (Signed)
Agree with the assessment and plan as outlined by Amanda Collier,  PA-C.  Scott E. Cunningham, MD  

## 2023-10-29 ENCOUNTER — Ambulatory Visit (HOSPITAL_COMMUNITY)
Admission: RE | Admit: 2023-10-29 | Discharge: 2023-10-29 | Disposition: A | Discharge status: Home / Self Care | Source: Ambulatory Visit | Attending: Physician Assistant | Admitting: Physician Assistant

## 2023-10-29 DIAGNOSIS — K5730 Diverticulosis of large intestine without perforation or abscess without bleeding: Secondary | ICD-10-CM | POA: Insufficient documentation

## 2023-10-29 DIAGNOSIS — R197 Diarrhea, unspecified: Secondary | ICD-10-CM | POA: Diagnosis not present

## 2023-10-29 DIAGNOSIS — R109 Unspecified abdominal pain: Secondary | ICD-10-CM | POA: Diagnosis not present

## 2023-10-29 DIAGNOSIS — K5900 Constipation, unspecified: Secondary | ICD-10-CM | POA: Diagnosis not present

## 2023-10-29 DIAGNOSIS — R140 Abdominal distension (gaseous): Secondary | ICD-10-CM | POA: Diagnosis not present

## 2023-10-29 MED ORDER — IOHEXOL 300 MG/ML  SOLN
100.0000 mL | Freq: Once | INTRAMUSCULAR | Status: AC | PRN
  Administered 2023-10-29: 100 mL via INTRAVENOUS

## 2023-10-29 MED ORDER — IOHEXOL 9 MG/ML PO SOLN
500.0000 mL | ORAL | Status: AC
  Administered 2023-10-29 (×2): 500 mL via ORAL

## 2023-10-31 DIAGNOSIS — M25331 Other instability, right wrist: Secondary | ICD-10-CM | POA: Diagnosis not present

## 2023-10-31 DIAGNOSIS — M25531 Pain in right wrist: Secondary | ICD-10-CM | POA: Diagnosis not present

## 2023-10-31 DIAGNOSIS — S92355A Nondisplaced fracture of fifth metatarsal bone, left foot, initial encounter for closed fracture: Secondary | ICD-10-CM | POA: Diagnosis not present

## 2023-11-04 DIAGNOSIS — M79672 Pain in left foot: Secondary | ICD-10-CM | POA: Diagnosis not present

## 2023-11-04 DIAGNOSIS — M1711 Unilateral primary osteoarthritis, right knee: Secondary | ICD-10-CM | POA: Diagnosis not present

## 2023-11-04 DIAGNOSIS — M79641 Pain in right hand: Secondary | ICD-10-CM | POA: Diagnosis not present

## 2023-11-19 ENCOUNTER — Ambulatory Visit: Admitting: Physician Assistant

## 2023-11-19 DIAGNOSIS — M79672 Pain in left foot: Secondary | ICD-10-CM | POA: Diagnosis not present

## 2023-11-20 ENCOUNTER — Ambulatory Visit: Admitting: Physician Assistant

## 2023-11-22 ENCOUNTER — Other Ambulatory Visit: Payer: Self-pay | Admitting: Gastroenterology

## 2023-11-29 DIAGNOSIS — Z0189 Encounter for other specified special examinations: Secondary | ICD-10-CM | POA: Diagnosis not present

## 2023-11-29 DIAGNOSIS — R7301 Impaired fasting glucose: Secondary | ICD-10-CM | POA: Diagnosis not present

## 2023-11-29 DIAGNOSIS — E785 Hyperlipidemia, unspecified: Secondary | ICD-10-CM | POA: Diagnosis not present

## 2023-11-29 DIAGNOSIS — E538 Deficiency of other specified B group vitamins: Secondary | ICD-10-CM | POA: Diagnosis not present

## 2023-11-29 DIAGNOSIS — E039 Hypothyroidism, unspecified: Secondary | ICD-10-CM | POA: Diagnosis not present

## 2023-12-06 DIAGNOSIS — Z1331 Encounter for screening for depression: Secondary | ICD-10-CM | POA: Diagnosis not present

## 2023-12-06 DIAGNOSIS — Z860100 Personal history of colon polyps, unspecified: Secondary | ICD-10-CM | POA: Diagnosis not present

## 2023-12-06 DIAGNOSIS — R7301 Impaired fasting glucose: Secondary | ICD-10-CM | POA: Diagnosis not present

## 2023-12-06 DIAGNOSIS — F419 Anxiety disorder, unspecified: Secondary | ICD-10-CM | POA: Diagnosis not present

## 2023-12-06 DIAGNOSIS — K219 Gastro-esophageal reflux disease without esophagitis: Secondary | ICD-10-CM | POA: Diagnosis not present

## 2023-12-06 DIAGNOSIS — Z0000 Encounter for general adult medical examination without abnormal findings: Secondary | ICD-10-CM | POA: Diagnosis not present

## 2023-12-06 DIAGNOSIS — E039 Hypothyroidism, unspecified: Secondary | ICD-10-CM | POA: Diagnosis not present

## 2023-12-06 DIAGNOSIS — M85859 Other specified disorders of bone density and structure, unspecified thigh: Secondary | ICD-10-CM | POA: Diagnosis not present

## 2023-12-06 DIAGNOSIS — Z1339 Encounter for screening examination for other mental health and behavioral disorders: Secondary | ICD-10-CM | POA: Diagnosis not present

## 2023-12-06 DIAGNOSIS — E669 Obesity, unspecified: Secondary | ICD-10-CM | POA: Diagnosis not present

## 2023-12-06 DIAGNOSIS — E785 Hyperlipidemia, unspecified: Secondary | ICD-10-CM | POA: Diagnosis not present

## 2023-12-06 DIAGNOSIS — Z6831 Body mass index (BMI) 31.0-31.9, adult: Secondary | ICD-10-CM | POA: Diagnosis not present

## 2023-12-06 DIAGNOSIS — N1831 Chronic kidney disease, stage 3a: Secondary | ICD-10-CM | POA: Diagnosis not present

## 2023-12-06 DIAGNOSIS — G4730 Sleep apnea, unspecified: Secondary | ICD-10-CM | POA: Diagnosis not present

## 2023-12-19 DIAGNOSIS — N951 Menopausal and female climacteric states: Secondary | ICD-10-CM | POA: Diagnosis not present

## 2023-12-20 DIAGNOSIS — R21 Rash and other nonspecific skin eruption: Secondary | ICD-10-CM | POA: Diagnosis not present

## 2023-12-31 DIAGNOSIS — Z23 Encounter for immunization: Secondary | ICD-10-CM | POA: Diagnosis not present

## 2024-01-13 ENCOUNTER — Encounter: Payer: Self-pay | Admitting: Gastroenterology

## 2024-01-13 DIAGNOSIS — R350 Frequency of micturition: Secondary | ICD-10-CM | POA: Diagnosis not present

## 2024-01-13 DIAGNOSIS — R309 Painful micturition, unspecified: Secondary | ICD-10-CM | POA: Diagnosis not present

## 2024-01-13 DIAGNOSIS — N390 Urinary tract infection, site not specified: Secondary | ICD-10-CM | POA: Diagnosis not present

## 2024-01-13 DIAGNOSIS — R82998 Other abnormal findings in urine: Secondary | ICD-10-CM | POA: Diagnosis not present

## 2024-01-14 DIAGNOSIS — M1711 Unilateral primary osteoarthritis, right knee: Secondary | ICD-10-CM | POA: Diagnosis not present

## 2024-02-04 DIAGNOSIS — M1711 Unilateral primary osteoarthritis, right knee: Secondary | ICD-10-CM | POA: Diagnosis not present

## 2024-02-11 DIAGNOSIS — M1711 Unilateral primary osteoarthritis, right knee: Secondary | ICD-10-CM | POA: Diagnosis not present

## 2024-02-18 DIAGNOSIS — M1711 Unilateral primary osteoarthritis, right knee: Secondary | ICD-10-CM | POA: Diagnosis not present

## 2024-03-03 DIAGNOSIS — Z1231 Encounter for screening mammogram for malignant neoplasm of breast: Secondary | ICD-10-CM | POA: Diagnosis not present

## 2024-03-18 DIAGNOSIS — J40 Bronchitis, not specified as acute or chronic: Secondary | ICD-10-CM | POA: Diagnosis not present

## 2024-03-18 DIAGNOSIS — R051 Acute cough: Secondary | ICD-10-CM | POA: Diagnosis not present

## 2024-03-18 DIAGNOSIS — J019 Acute sinusitis, unspecified: Secondary | ICD-10-CM | POA: Diagnosis not present

## 2024-03-27 ENCOUNTER — Other Ambulatory Visit: Payer: Self-pay | Admitting: Family Medicine

## 2024-03-27 ENCOUNTER — Other Ambulatory Visit: Payer: Self-pay | Admitting: Gastroenterology

## 2024-03-27 ENCOUNTER — Telehealth: Payer: Self-pay | Admitting: Internal Medicine

## 2024-03-27 NOTE — Telephone Encounter (Signed)
 LVM confirming refill request was received but unable to fill.  Unfortunately, they are no longer a pt with our office. They have not been seen in more than 3 years.

## 2024-03-31 DIAGNOSIS — M1711 Unilateral primary osteoarthritis, right knee: Secondary | ICD-10-CM | POA: Diagnosis not present
